# Patient Record
Sex: Female | Born: 1975 | Race: Black or African American | Hispanic: No | Marital: Married | Smoking: Former smoker
Health system: Southern US, Community
[De-identification: ages and names within clinical notes are randomized; demographics above are authoritative.]

## PROBLEM LIST (undated history)

## (undated) DIAGNOSIS — R718 Other abnormality of red blood cells: Secondary | ICD-10-CM

## (undated) DIAGNOSIS — I7300 Raynaud's syndrome without gangrene: Secondary | ICD-10-CM

## (undated) DIAGNOSIS — K589 Irritable bowel syndrome without diarrhea: Secondary | ICD-10-CM

## (undated) DIAGNOSIS — M357 Hypermobility syndrome: Secondary | ICD-10-CM

## (undated) DIAGNOSIS — M545 Low back pain, unspecified: Secondary | ICD-10-CM

## (undated) DIAGNOSIS — M4180 Other forms of scoliosis, site unspecified: Secondary | ICD-10-CM

## (undated) DIAGNOSIS — F3181 Bipolar II disorder: Secondary | ICD-10-CM

## (undated) DIAGNOSIS — Q6589 Other specified congenital deformities of hip: Secondary | ICD-10-CM

## (undated) DIAGNOSIS — E785 Hyperlipidemia, unspecified: Secondary | ICD-10-CM

## (undated) DIAGNOSIS — H539 Unspecified visual disturbance: Secondary | ICD-10-CM

## (undated) DIAGNOSIS — D763 Other histiocytosis syndromes: Secondary | ICD-10-CM

## (undated) DIAGNOSIS — H1010 Acute atopic conjunctivitis, unspecified eye: Secondary | ICD-10-CM

## (undated) DIAGNOSIS — M160 Bilateral primary osteoarthritis of hip: Secondary | ICD-10-CM

## (undated) DIAGNOSIS — M1990 Unspecified osteoarthritis, unspecified site: Secondary | ICD-10-CM

## (undated) DIAGNOSIS — K5790 Diverticulosis of intestine, part unspecified, without perforation or abscess without bleeding: Secondary | ICD-10-CM

## (undated) DIAGNOSIS — F4310 Post-traumatic stress disorder, unspecified: Secondary | ICD-10-CM

## (undated) DIAGNOSIS — M62838 Other muscle spasm: Secondary | ICD-10-CM

## (undated) DIAGNOSIS — H1045 Other chronic allergic conjunctivitis: Secondary | ICD-10-CM

## (undated) DIAGNOSIS — F909 Attention-deficit hyperactivity disorder, unspecified type: Secondary | ICD-10-CM

## (undated) DIAGNOSIS — K219 Gastro-esophageal reflux disease without esophagitis: Secondary | ICD-10-CM

## (undated) DIAGNOSIS — D509 Iron deficiency anemia, unspecified: Secondary | ICD-10-CM

## (undated) DIAGNOSIS — F419 Anxiety disorder, unspecified: Secondary | ICD-10-CM

## (undated) DIAGNOSIS — K2990 Gastroduodenitis, unspecified, without bleeding: Secondary | ICD-10-CM

## (undated) DIAGNOSIS — M797 Fibromyalgia: Secondary | ICD-10-CM

## (undated) DIAGNOSIS — I10 Essential (primary) hypertension: Secondary | ICD-10-CM

## (undated) DIAGNOSIS — J45909 Unspecified asthma, uncomplicated: Secondary | ICD-10-CM

## (undated) HISTORY — DX: Iron deficiency anemia, unspecified: D50.9

## (undated) HISTORY — DX: Post-traumatic stress disorder, unspecified: F43.10

## (undated) HISTORY — DX: Unspecified osteoarthritis, unspecified site: M19.90

## (undated) HISTORY — DX: Other chronic allergic conjunctivitis: H10.45

## (undated) HISTORY — DX: Acute atopic conjunctivitis, unspecified eye: H10.10

## (undated) HISTORY — DX: Low back pain, unspecified: M54.50

## (undated) HISTORY — DX: Attention-deficit hyperactivity disorder, unspecified type: F90.9

## (undated) HISTORY — DX: Bipolar II disorder: F31.81

## (undated) HISTORY — DX: Essential (primary) hypertension: I10

## (undated) HISTORY — DX: Irritable bowel syndrome, unspecified: K58.9

## (undated) HISTORY — DX: Gastroduodenitis, unspecified, without bleeding: K29.90

## (undated) HISTORY — PX: WISDOM TOOTH EXTRACTION: SHX21

## (undated) HISTORY — DX: Hypermobility syndrome: M35.7

## (undated) HISTORY — DX: Other muscle spasm: M62.838

## (undated) HISTORY — DX: Bilateral primary osteoarthritis of hip: M16.0

## (undated) HISTORY — DX: Unspecified visual disturbance: H53.9

## (undated) HISTORY — PX: ABDOMINAL HYSTERECTOMY: SHX81

## (undated) HISTORY — DX: Other specified congenital deformities of hip: Q65.89

## (undated) HISTORY — DX: Other abnormality of red blood cells: R71.8

## (undated) HISTORY — DX: Hyperlipidemia, unspecified: E78.5

## (undated) HISTORY — PX: TONSILLECTOMY: SUR1361

## (undated) HISTORY — DX: Other histiocytosis syndromes: D76.3

## (undated) HISTORY — DX: Other forms of scoliosis, site unspecified: M41.80

## (undated) HISTORY — PX: OTHER SURGICAL HISTORY: SHX169

## (undated) HISTORY — DX: Diverticulosis of intestine, part unspecified, without perforation or abscess without bleeding: K57.90

---

## 1997-11-03 ENCOUNTER — Encounter: Admission: RE | Admit: 1997-11-03 | Discharge: 1997-11-03 | Payer: Self-pay | Admitting: Internal Medicine

## 2009-12-13 ENCOUNTER — Encounter: Admission: RE | Admit: 2009-12-13 | Discharge: 2009-12-13 | Payer: Self-pay | Admitting: Gastroenterology

## 2009-12-28 HISTORY — PX: COLONOSCOPY: SHX174

## 2009-12-28 HISTORY — PX: ESOPHAGOGASTRODUODENOSCOPY: SHX1529

## 2014-02-05 DIAGNOSIS — K219 Gastro-esophageal reflux disease without esophagitis: Secondary | ICD-10-CM

## 2014-02-05 HISTORY — DX: Gastro-esophageal reflux disease without esophagitis: K21.9

## 2014-02-16 DIAGNOSIS — G894 Chronic pain syndrome: Secondary | ICD-10-CM

## 2014-02-16 HISTORY — DX: Chronic pain syndrome: G89.4

## 2014-04-11 DIAGNOSIS — R202 Paresthesia of skin: Secondary | ICD-10-CM | POA: Insufficient documentation

## 2014-04-11 DIAGNOSIS — R200 Anesthesia of skin: Secondary | ICD-10-CM

## 2014-04-11 HISTORY — DX: Paresthesia of skin: R20.2

## 2014-04-11 HISTORY — DX: Anesthesia of skin: R20.0

## 2014-04-25 ENCOUNTER — Ambulatory Visit: Payer: Self-pay | Admitting: Neurology

## 2014-04-26 ENCOUNTER — Telehealth: Payer: Self-pay | Admitting: Neurology

## 2014-04-26 ENCOUNTER — Encounter: Payer: Self-pay | Admitting: Neurology

## 2014-04-26 NOTE — Telephone Encounter (Signed)
Pt no showed NP appt sch w/ Dr. Everlena Cooper 04/25/14. No show letter with no show policy mailed to pt. I will need to pull the referral packet to get referring provider contact info and will notify the referring provider / Sherri S.

## 2014-05-02 ENCOUNTER — Telehealth: Payer: Self-pay | Admitting: Neurology

## 2014-05-02 NOTE — Telephone Encounter (Signed)
Faxed no show notification to referring provider's office, Dr. Wyona Almas # [phone] / Oneita Kras.

## 2014-05-08 DIAGNOSIS — M797 Fibromyalgia: Secondary | ICD-10-CM

## 2014-05-08 HISTORY — DX: Fibromyalgia: M79.7

## 2014-06-05 DIAGNOSIS — I7300 Raynaud's syndrome without gangrene: Secondary | ICD-10-CM

## 2014-06-05 HISTORY — DX: Raynaud's syndrome without gangrene: I73.00

## 2014-06-26 DIAGNOSIS — R7689 Other specified abnormal immunological findings in serum: Secondary | ICD-10-CM

## 2014-06-26 DIAGNOSIS — R768 Other specified abnormal immunological findings in serum: Secondary | ICD-10-CM

## 2014-06-26 HISTORY — DX: Other specified abnormal immunological findings in serum: R76.89

## 2014-06-26 HISTORY — DX: Other specified abnormal immunological findings in serum: R76.8

## 2014-07-13 DIAGNOSIS — M7061 Trochanteric bursitis, right hip: Secondary | ICD-10-CM

## 2014-07-13 HISTORY — DX: Trochanteric bursitis, right hip: M70.61

## 2014-07-24 DIAGNOSIS — M7051 Other bursitis of knee, right knee: Secondary | ICD-10-CM

## 2014-07-24 HISTORY — DX: Other bursitis of knee, right knee: M70.51

## 2014-07-28 DIAGNOSIS — E559 Vitamin D deficiency, unspecified: Secondary | ICD-10-CM

## 2014-07-28 HISTORY — DX: Vitamin D deficiency, unspecified: E55.9

## 2014-08-27 DIAGNOSIS — M25572 Pain in left ankle and joints of left foot: Secondary | ICD-10-CM

## 2014-08-27 HISTORY — DX: Pain in left ankle and joints of left foot: M25.572

## 2014-09-18 DIAGNOSIS — M25562 Pain in left knee: Secondary | ICD-10-CM

## 2014-09-18 DIAGNOSIS — G8929 Other chronic pain: Secondary | ICD-10-CM

## 2014-09-18 HISTORY — DX: Pain in left knee: M25.562

## 2014-09-18 HISTORY — DX: Other chronic pain: G89.29

## 2014-11-29 ENCOUNTER — Emergency Department (HOSPITAL_BASED_OUTPATIENT_CLINIC_OR_DEPARTMENT_OTHER): Payer: Self-pay

## 2014-11-29 ENCOUNTER — Emergency Department (HOSPITAL_BASED_OUTPATIENT_CLINIC_OR_DEPARTMENT_OTHER)
Admission: EM | Admit: 2014-11-29 | Discharge: 2014-11-29 | Disposition: A | Discharge status: Home / Self Care | Payer: Self-pay | Attending: Emergency Medicine | Admitting: Emergency Medicine

## 2014-11-29 ENCOUNTER — Encounter (HOSPITAL_BASED_OUTPATIENT_CLINIC_OR_DEPARTMENT_OTHER): Payer: Self-pay | Admitting: *Deleted

## 2014-11-29 DIAGNOSIS — Y998 Other external cause status: Secondary | ICD-10-CM | POA: Insufficient documentation

## 2014-11-29 DIAGNOSIS — Y9389 Activity, other specified: Secondary | ICD-10-CM | POA: Insufficient documentation

## 2014-11-29 DIAGNOSIS — S199XXA Unspecified injury of neck, initial encounter: Secondary | ICD-10-CM | POA: Insufficient documentation

## 2014-11-29 DIAGNOSIS — Z7951 Long term (current) use of inhaled steroids: Secondary | ICD-10-CM | POA: Insufficient documentation

## 2014-11-29 DIAGNOSIS — Y92410 Unspecified street and highway as the place of occurrence of the external cause: Secondary | ICD-10-CM | POA: Insufficient documentation

## 2014-11-29 DIAGNOSIS — F419 Anxiety disorder, unspecified: Secondary | ICD-10-CM | POA: Insufficient documentation

## 2014-11-29 DIAGNOSIS — Z8679 Personal history of other diseases of the circulatory system: Secondary | ICD-10-CM | POA: Insufficient documentation

## 2014-11-29 DIAGNOSIS — Z880 Allergy status to penicillin: Secondary | ICD-10-CM | POA: Insufficient documentation

## 2014-11-29 DIAGNOSIS — S6991XA Unspecified injury of right wrist, hand and finger(s), initial encounter: Secondary | ICD-10-CM | POA: Insufficient documentation

## 2014-11-29 DIAGNOSIS — K219 Gastro-esophageal reflux disease without esophagitis: Secondary | ICD-10-CM | POA: Insufficient documentation

## 2014-11-29 DIAGNOSIS — Z8739 Personal history of other diseases of the musculoskeletal system and connective tissue: Secondary | ICD-10-CM | POA: Insufficient documentation

## 2014-11-29 DIAGNOSIS — Z79899 Other long term (current) drug therapy: Secondary | ICD-10-CM | POA: Insufficient documentation

## 2014-11-29 DIAGNOSIS — Z87891 Personal history of nicotine dependence: Secondary | ICD-10-CM | POA: Insufficient documentation

## 2014-11-29 DIAGNOSIS — J45909 Unspecified asthma, uncomplicated: Secondary | ICD-10-CM | POA: Insufficient documentation

## 2014-11-29 HISTORY — DX: Unspecified asthma, uncomplicated: J45.909

## 2014-11-29 HISTORY — DX: Gastro-esophageal reflux disease without esophagitis: K21.9

## 2014-11-29 HISTORY — DX: Fibromyalgia: M79.7

## 2014-11-29 HISTORY — DX: Raynaud's syndrome without gangrene: I73.00

## 2014-11-29 HISTORY — DX: Anxiety disorder, unspecified: F41.9
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
## 2014-11-29 NOTE — Discharge Instructions (Signed)

## 2014-11-29 NOTE — ED Notes (Signed)
Patient transported to X-ray via stretcher, sr x 2 up 
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
## 2014-11-29 NOTE — ED Notes (Signed)
Per EDP orders, C-collar removed. Splint to rt wrist applied by EMT

## 2014-11-29 NOTE — ED Notes (Signed)
MD at bedside. 

## 2014-12-22 DIAGNOSIS — G35 Multiple sclerosis: Secondary | ICD-10-CM | POA: Insufficient documentation

## 2014-12-22 HISTORY — DX: Multiple sclerosis: G35

## 2015-04-03 DIAGNOSIS — F419 Anxiety disorder, unspecified: Secondary | ICD-10-CM

## 2015-04-03 DIAGNOSIS — F32A Depression, unspecified: Secondary | ICD-10-CM

## 2015-04-03 DIAGNOSIS — F329 Major depressive disorder, single episode, unspecified: Secondary | ICD-10-CM | POA: Insufficient documentation

## 2015-04-03 HISTORY — DX: Depression, unspecified: F32.A

## 2015-04-03 HISTORY — DX: Anxiety disorder, unspecified: F41.9

## 2015-04-24 DIAGNOSIS — F4001 Agoraphobia with panic disorder: Secondary | ICD-10-CM

## 2015-04-24 DIAGNOSIS — T7840XA Allergy, unspecified, initial encounter: Secondary | ICD-10-CM

## 2015-04-24 HISTORY — DX: Allergy, unspecified, initial encounter: T78.40XA

## 2015-04-24 HISTORY — DX: Agoraphobia with panic disorder: F40.01
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
## 2016-10-03 HISTORY — PX: OTHER SURGICAL HISTORY: SHX169

## 2016-10-23 DIAGNOSIS — M5020 Other cervical disc displacement, unspecified cervical region: Secondary | ICD-10-CM

## 2016-10-23 DIAGNOSIS — M4802 Spinal stenosis, cervical region: Secondary | ICD-10-CM | POA: Insufficient documentation

## 2016-10-23 DIAGNOSIS — M5412 Radiculopathy, cervical region: Secondary | ICD-10-CM

## 2016-10-23 HISTORY — DX: Other cervical disc displacement, unspecified cervical region: M50.20

## 2016-10-23 HISTORY — DX: Spinal stenosis, cervical region: M48.02

## 2016-10-23 HISTORY — DX: Radiculopathy, cervical region: M54.12

## 2016-10-28 ENCOUNTER — Other Ambulatory Visit: Payer: Self-pay | Admitting: Neurological Surgery

## 2016-10-28 DIAGNOSIS — M542 Cervicalgia: Secondary | ICD-10-CM

## 2016-11-02 HISTORY — PX: OTHER SURGICAL HISTORY: SHX169

## 2018-01-07 ENCOUNTER — Other Ambulatory Visit: Payer: Self-pay | Admitting: Family Medicine

## 2018-01-07 DIAGNOSIS — H538 Other visual disturbances: Secondary | ICD-10-CM

## 2018-01-19 ENCOUNTER — Ambulatory Visit
Admission: RE | Admit: 2018-01-19 | Discharge: 2018-01-19 | Disposition: A | Discharge status: Home / Self Care | Payer: 59 | Source: Ambulatory Visit | Attending: Family Medicine | Admitting: Family Medicine

## 2018-01-19 DIAGNOSIS — H538 Other visual disturbances: Secondary | ICD-10-CM

## 2018-01-19 MED ORDER — GADOBENATE DIMEGLUMINE 529 MG/ML IV SOLN
20.0000 mL | Freq: Once | INTRAVENOUS | Status: AC | PRN
  Administered 2018-01-19: 20 mL via INTRAVENOUS

## 2018-01-29 ENCOUNTER — Ambulatory Visit: Payer: 59 | Admitting: Neurology

## 2018-01-29 ENCOUNTER — Encounter: Payer: Self-pay | Admitting: Neurology

## 2018-01-29 ENCOUNTER — Other Ambulatory Visit: Payer: Self-pay

## 2018-01-29 VITALS — BP 136/95 | HR 88 | Resp 18 | Ht 65.0 in | Wt 234.0 lb

## 2018-01-29 DIAGNOSIS — R202 Paresthesia of skin: Secondary | ICD-10-CM

## 2018-01-29 DIAGNOSIS — F419 Anxiety disorder, unspecified: Secondary | ICD-10-CM

## 2018-01-29 DIAGNOSIS — R200 Anesthesia of skin: Secondary | ICD-10-CM

## 2018-01-29 DIAGNOSIS — G35 Multiple sclerosis: Secondary | ICD-10-CM

## 2018-01-29 DIAGNOSIS — G4489 Other headache syndrome: Secondary | ICD-10-CM | POA: Insufficient documentation

## 2018-01-29 DIAGNOSIS — Z79899 Other long term (current) drug therapy: Secondary | ICD-10-CM | POA: Insufficient documentation

## 2018-01-29 DIAGNOSIS — R3915 Urgency of urination: Secondary | ICD-10-CM | POA: Insufficient documentation

## 2018-01-29 DIAGNOSIS — F329 Major depressive disorder, single episode, unspecified: Secondary | ICD-10-CM

## 2018-01-29 HISTORY — DX: Other headache syndrome: G44.89

## 2018-01-29 HISTORY — DX: Urgency of urination: R39.15

## 2018-01-29 HISTORY — DX: Other long term (current) drug therapy: Z79.899

## 2018-01-29 MED ORDER — ONDANSETRON HCL 4 MG PO TABS: 4.0000 mg | ORAL_TABLET | Freq: Every day | ORAL | 2 refills | Status: DC | PRN

## 2018-01-29 MED ORDER — OXCARBAZEPINE 150 MG PO TABS: 150.0000 mg | ORAL_TABLET | Freq: Two times a day (BID) | ORAL | 5 refills | Status: DC
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
## 2018-02-02 LAB — QUANTIFERON-TB GOLD PLUS
QUANTIFERON TB2 AG VALUE: 0.53 [IU]/mL
QuantiFERON Mitogen Value: >10 IU/mL
QuantiFERON Nil Value: 0.09 IU/mL
QuantiFERON TB1 Ag Value: 0.5 IU/mL
QuantiFERON-TB Gold Plus: POSITIVE — AB

## 2018-02-02 LAB — CBC WITH DIFFERENTIAL/PLATELET
BASOS: 1 %
Basophils Absolute: 0.1 10*3/uL (ref 0.0–0.2)
EOS (ABSOLUTE): 0.1 10*3/uL (ref 0.0–0.4)
Eos: 1 %
HEMOGLOBIN: 13 g/dL (ref 11.1–15.9)
Hematocrit: 39.6 % (ref 34.0–46.6)
IMMATURE GRANS (ABS): 0 10*3/uL (ref 0.0–0.1)
Immature Granulocytes: 0 %
LYMPHS: 33 %
Lymphocytes Absolute: 3.2 10*3/uL — ABNORMAL HIGH (ref 0.7–3.1)
MCH: 25.1 pg — AB (ref 26.6–33.0)
MCHC: 32.8 g/dL (ref 31.5–35.7)
MCV: 76 fL — AB (ref 79–97)
MONOCYTES: 6 %
Monocytes Absolute: 0.6 10*3/uL (ref 0.1–0.9)
NEUTROS ABS: 5.9 10*3/uL (ref 1.4–7.0)
Neutrophils: 59 %
Platelets: 338 10*3/uL (ref 150–450)
RBC: 5.18 x10E6/uL (ref 3.77–5.28)
RDW: 14.4 % (ref 12.3–15.4)
WBC: 9.8 10*3/uL (ref 3.4–10.8)

## 2018-02-02 LAB — COMPREHENSIVE METABOLIC PANEL
A/G RATIO: 1.8 (ref 1.2–2.2)
ALBUMIN: 4.3 g/dL (ref 3.5–5.5)
ALT: 12 IU/L (ref 0–32)
AST: 14 IU/L (ref 0–40)
Alkaline Phosphatase: 98 IU/L (ref 39–117)
BILIRUBIN TOTAL: 0.4 mg/dL (ref 0.0–1.2)
BUN / CREAT RATIO: 12 (ref 9–23)
BUN: 11 mg/dL (ref 6–24)
CO2: 21 mmol/L (ref 20–29)
Calcium: 9.5 mg/dL (ref 8.7–10.2)
Chloride: 99 mmol/L (ref 96–106)
Creatinine, Ser: 0.94 mg/dL (ref 0.57–1.00)
GFR calc non Af Amer: 75 mL/min/{1.73_m2} (ref >59)
GFR, EST AFRICAN AMERICAN: 87 mL/min/{1.73_m2} (ref >59)
GLOBULIN, TOTAL: 2.4 g/dL (ref 1.5–4.5)
Glucose: 107 mg/dL — ABNORMAL HIGH (ref 65–99)
Potassium: 4.3 mmol/L (ref 3.5–5.2)
Sodium: 137 mmol/L (ref 134–144)
TOTAL PROTEIN: 6.7 g/dL (ref 6.0–8.5)

## 2018-02-08 ENCOUNTER — Telehealth: Payer: Self-pay | Admitting: *Deleted

## 2018-02-08 DIAGNOSIS — G35 Multiple sclerosis: Secondary | ICD-10-CM

## 2018-02-08 DIAGNOSIS — R7611 Nonspecific reaction to tuberculin skin test without active tuberculosis: Secondary | ICD-10-CM

## 2018-02-08 NOTE — Telephone Encounter (Signed)
Called, LVM for pt to call about results.  

## 2018-02-08 NOTE — Telephone Encounter (Signed)
Called pt and relayed results per Dr. Epimenio Foot note. She verbalized understanding and would like [place] to place referral to Dr. Meredeth Ide. Advised she should be called within the next week or so to get scheduled. If not, she should contact their office to schedule.

## 2018-02-08 NOTE — Telephone Encounter (Signed)
Pt returning RN's call.

## 2018-02-08 NOTE — Telephone Encounter (Signed)
Called, LVM returning pt call 

## 2018-02-08 NOTE — Telephone Encounter (Signed)
I called pt and advised no skin TB test needed. She should f/u with Dr. Meredeth Ide. Pt wants TB skin test. She is going to request this from PCP. Nothing further needed.

## 2018-02-08 NOTE — Telephone Encounter (Signed)
Pt requesting a call stating she would like to know if a skin test can be done to determine if she has TB

## 2018-02-08 NOTE — Telephone Encounter (Signed)
-----   Message from Asa Lente, MD sent at 02/05/2018 12:08 PM EDT ----- We were thinking about starting Aubagio for her MS.  However, the Quantiferon TB test was positive and it is possible that she has tuberculosis.  She has seen Dr. Meredeth Ide [facility] pulmonology) just a couple months ago and she should follow-up with him about this.  (If we need to we can re--refer her to him).   She can not go on Aubagio unless we know that she does not have TB

## 2018-02-10 NOTE — Telephone Encounter (Addendum)
I called patient back. She picked up the call. I explained to her that she would still need to see pulmonologist to verify she does not have TB. Pt did not respond and she ended that call.  Spoke with Dr. Epimenio Foot who is not comfortable starting medication for MS until patient is cleared by pulmonologist.

## 2018-02-10 NOTE — Telephone Encounter (Signed)
Patient said she went to her PCP and they did a TB skin test and she does not have Tuberculosis. 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

## 2018-05-17 ENCOUNTER — Encounter: Payer: Self-pay | Admitting: Neurology

## 2018-06-11 ENCOUNTER — Ambulatory Visit: Payer: 59 | Admitting: Neurology

## 2018-07-09 ENCOUNTER — Ambulatory Visit: Payer: 59 | Admitting: Neurology

## 2018-07-16 ENCOUNTER — Ambulatory Visit: Payer: 59 | Admitting: Neurology

## 2018-08-20 ENCOUNTER — Ambulatory Visit: Payer: 59 | Admitting: Neurology

## 2018-08-23 ENCOUNTER — Ambulatory Visit (INDEPENDENT_AMBULATORY_CARE_PROVIDER_SITE_OTHER): Payer: Medicare Other | Admitting: Neurology

## 2018-08-23 ENCOUNTER — Encounter: Payer: Self-pay | Admitting: Neurology

## 2018-08-23 ENCOUNTER — Other Ambulatory Visit: Payer: Self-pay

## 2018-08-23 VITALS — BP 161/100 | HR 93 | Temp 96.8°F | Ht 65.0 in | Wt 237.5 lb

## 2018-08-23 DIAGNOSIS — R200 Anesthesia of skin: Secondary | ICD-10-CM | POA: Diagnosis not present

## 2018-08-23 DIAGNOSIS — F419 Anxiety disorder, unspecified: Secondary | ICD-10-CM

## 2018-08-23 DIAGNOSIS — R3915 Urgency of urination: Secondary | ICD-10-CM

## 2018-08-23 DIAGNOSIS — G35 Multiple sclerosis: Secondary | ICD-10-CM

## 2018-08-23 DIAGNOSIS — G4489 Other headache syndrome: Secondary | ICD-10-CM

## 2018-08-23 DIAGNOSIS — F329 Major depressive disorder, single episode, unspecified: Secondary | ICD-10-CM

## 2018-08-23 DIAGNOSIS — R110 Nausea: Secondary | ICD-10-CM

## 2018-08-23 DIAGNOSIS — R202 Paresthesia of skin: Secondary | ICD-10-CM

## 2018-08-23 HISTORY — DX: Nausea: R11.0

## 2018-08-23 MED ORDER — ONDANSETRON HCL 4 MG PO TABS: 4.0000 mg | ORAL_TABLET | Freq: Every day | ORAL | 2 refills | Status: DC | PRN

## 2018-08-23 MED ORDER — SOLIFENACIN SUCCINATE 5 MG PO TABS: 5.0000 mg | ORAL_TABLET | Freq: Every day | ORAL | 11 refills | Status: DC
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
## 2018-12-24 ENCOUNTER — Ambulatory Visit: Payer: 59 | Admitting: Neurology

## 2019-01-26 ENCOUNTER — Other Ambulatory Visit: Payer: Self-pay

## 2019-01-26 ENCOUNTER — Encounter: Payer: Self-pay | Admitting: Neurology

## 2019-01-26 ENCOUNTER — Ambulatory Visit (INDEPENDENT_AMBULATORY_CARE_PROVIDER_SITE_OTHER): Payer: Medicare Other | Admitting: Neurology

## 2019-01-26 VITALS — BP 138/90 | HR 101 | Temp 98.2°F | Ht 65.0 in | Wt 238.5 lb

## 2019-01-26 DIAGNOSIS — G35 Multiple sclerosis: Secondary | ICD-10-CM | POA: Diagnosis not present

## 2019-01-26 DIAGNOSIS — R202 Paresthesia of skin: Secondary | ICD-10-CM

## 2019-01-26 DIAGNOSIS — R208 Other disturbances of skin sensation: Secondary | ICD-10-CM | POA: Insufficient documentation

## 2019-01-26 DIAGNOSIS — R200 Anesthesia of skin: Secondary | ICD-10-CM | POA: Diagnosis not present

## 2019-01-26 DIAGNOSIS — M5416 Radiculopathy, lumbar region: Secondary | ICD-10-CM

## 2019-01-26 HISTORY — DX: Radiculopathy, lumbar region: M54.16

## 2019-01-26 HISTORY — DX: Other disturbances of skin sensation: R20.8

## 2019-01-26 MED ORDER — OXCARBAZEPINE 150 MG PO TABS: 150.0000 mg | ORAL_TABLET | Freq: Two times a day (BID) | ORAL | 5 refills | Status: DC
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
## 2019-02-01 ENCOUNTER — Telehealth: Payer: Self-pay | Admitting: Neurology

## 2019-02-01 ENCOUNTER — Other Ambulatory Visit: Payer: Self-pay | Admitting: *Deleted

## 2019-02-01 DIAGNOSIS — M5416 Radiculopathy, lumbar region: Secondary | ICD-10-CM

## 2019-02-01 NOTE — Telephone Encounter (Signed)
Referral Form to Dr. Daisey Must Neuro surg.

## 2019-02-02 NOTE — Telephone Encounter (Signed)
Faxed referral to [facility] for Dr. Maryjean Ka.

## 2019-02-09 ENCOUNTER — Ambulatory Visit: Payer: Self-pay | Admitting: Neurology

## 2019-02-10 ENCOUNTER — Telehealth: Payer: Self-pay | Admitting: *Deleted

## 2019-02-10 NOTE — Telephone Encounter (Signed)
Spoke with the patient and she has agreed to come in and see Dr. Felecia Shelling on 02-14-2019 at 2:30 pm

## 2019-02-10 NOTE — Telephone Encounter (Signed)
Called, LVM for pt to call office back.  I sent mychart message offering work in appt on 02/14/2019 at 2:30pm with Dr. Felecia Shelling but she has not seen in yet. Wanting to know if she would like this appt.

## 2019-02-10 NOTE — Telephone Encounter (Signed)
Noted, thank you

## 2019-02-14 ENCOUNTER — Ambulatory Visit: Payer: Self-pay | Admitting: Neurology

## 2019-02-14 ENCOUNTER — Encounter: Payer: Self-pay | Admitting: Neurology

## 2019-02-21 ENCOUNTER — Ambulatory Visit: Payer: Self-pay | Admitting: Neurology

## 2019-04-28 ENCOUNTER — Ambulatory Visit: Payer: Medicare Other | Admitting: Neurology

## 2019-06-15 ENCOUNTER — Ambulatory Visit (INDEPENDENT_AMBULATORY_CARE_PROVIDER_SITE_OTHER): Payer: Medicare Other | Admitting: Neurology

## 2019-06-15 ENCOUNTER — Encounter: Payer: Self-pay | Admitting: Neurology

## 2019-06-15 ENCOUNTER — Other Ambulatory Visit: Payer: Self-pay

## 2019-06-15 VITALS — BP 120/80 | HR 98 | Temp 97.3°F | Ht 65.0 in | Wt 236.0 lb

## 2019-06-15 DIAGNOSIS — G96198 Other disorders of meninges, not elsewhere classified: Secondary | ICD-10-CM

## 2019-06-15 DIAGNOSIS — Z79899 Other long term (current) drug therapy: Secondary | ICD-10-CM | POA: Diagnosis not present

## 2019-06-15 DIAGNOSIS — M5416 Radiculopathy, lumbar region: Secondary | ICD-10-CM | POA: Diagnosis not present

## 2019-06-15 DIAGNOSIS — G35 Multiple sclerosis: Secondary | ICD-10-CM | POA: Diagnosis not present

## 2019-06-15 DIAGNOSIS — R3915 Urgency of urination: Secondary | ICD-10-CM

## 2019-06-15 DIAGNOSIS — R208 Other disturbances of skin sensation: Secondary | ICD-10-CM

## 2019-06-15 HISTORY — DX: Other disorders of meninges, not elsewhere classified: G96.198

## 2019-06-15 MED ORDER — OXCARBAZEPINE 150 MG PO TABS: ORAL_TABLET | ORAL | 4 refills | Status: DC

## 2019-06-15 MED ORDER — METHYLPREDNISOLONE 4 MG PO TABS: ORAL_TABLET | ORAL | 0 refills | Status: DC

## 2019-06-15 MED ORDER — AMITRIPTYLINE HCL 50 MG PO TABS: 50.0000 mg | ORAL_TABLET | Freq: Every day | ORAL | 4 refills | Status: DC
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
## 2019-06-28 ENCOUNTER — Other Ambulatory Visit: Payer: Self-pay | Admitting: Neurology

## 2019-06-28 DIAGNOSIS — Z79899 Other long term (current) drug therapy: Secondary | ICD-10-CM

## 2019-06-28 DIAGNOSIS — G35 Multiple sclerosis: Secondary | ICD-10-CM

## 2019-06-28 DIAGNOSIS — Z114 Encounter for screening for human immunodeficiency virus [HIV]: Secondary | ICD-10-CM

## 2019-07-01 ENCOUNTER — Ambulatory Visit: Payer: 59

## 2019-07-11 ENCOUNTER — Other Ambulatory Visit: Payer: Self-pay | Admitting: Neurology

## 2019-07-11 MED ORDER — HYDROXYZINE HCL 25 MG PO TABS: 25.0000 mg | ORAL_TABLET | Freq: Three times a day (TID) | ORAL | 2 refills | Status: DC | PRN

## 2019-07-13 ENCOUNTER — Other Ambulatory Visit (INDEPENDENT_AMBULATORY_CARE_PROVIDER_SITE_OTHER): Payer: Self-pay

## 2019-07-13 DIAGNOSIS — Z0289 Encounter for other administrative examinations: Secondary | ICD-10-CM

## 2019-07-26 NOTE — Telephone Encounter (Signed)
Pt advised she no longer wishes to take the kesimpta due to concerns for side effects states she had a bad reaction to moderna for about a week. And would like to know what the next step would be and for any upcoming apts with the change

## 2019-07-26 NOTE — Telephone Encounter (Addendum)
Called and LVM for pt to call office because she has not received our mychart messages yet. Wanted to get update to see when she was planning on coming to get lab work that Dr. Epimenio Foot ordered.

## 2019-07-27 ENCOUNTER — Ambulatory Visit: Payer: Medicare Other | Admitting: Neurology

## 2019-07-27 NOTE — Telephone Encounter (Signed)
Called pt. Offered appt 08/01/19 at 8:30am with Dr. Epimenio Foot. She declined. States she is with her grandmother on [address]. Would like to wait for any cx on a different day in the next month. Advised I will call her if anything becomes available. She verbalized understanding.

## 2019-08-02 NOTE — Telephone Encounter (Signed)
Called pt at [phone] and LVM for her to call office back. Advised I was going to offer an appt either tomorrow or Thursday.

## 2019-08-03 ENCOUNTER — Other Ambulatory Visit: Payer: Self-pay | Admitting: Neurology

## 2019-08-30 NOTE — Telephone Encounter (Signed)
Called pt and offered an appt today at 1130am with Dr. Epimenio Foot. He had a cancellation. Pt declined, states she is taking methylprednisolone right now because she hurt her back. She is walking with a walker. She will call once feeling better to see if there are any cx. Advised I will also call if any cx come up in the future.

## 2019-09-17 ENCOUNTER — Other Ambulatory Visit: Payer: Self-pay | Admitting: Neurology

## 2019-11-14 ENCOUNTER — Ambulatory Visit (INDEPENDENT_AMBULATORY_CARE_PROVIDER_SITE_OTHER): Payer: Medicare Other | Admitting: Neurology

## 2019-11-14 ENCOUNTER — Encounter: Payer: Self-pay | Admitting: Neurology

## 2019-11-14 VITALS — BP 126/82 | HR 106 | Ht 65.0 in | Wt 228.0 lb

## 2019-11-14 DIAGNOSIS — F319 Bipolar disorder, unspecified: Secondary | ICD-10-CM | POA: Diagnosis not present

## 2019-11-14 DIAGNOSIS — G96198 Other disorders of meninges, not elsewhere classified: Secondary | ICD-10-CM

## 2019-11-14 DIAGNOSIS — G35 Multiple sclerosis: Secondary | ICD-10-CM | POA: Diagnosis not present

## 2019-11-14 DIAGNOSIS — R208 Other disturbances of skin sensation: Secondary | ICD-10-CM

## 2019-11-14 DIAGNOSIS — Z79899 Other long term (current) drug therapy: Secondary | ICD-10-CM

## 2019-11-14 MED ORDER — METHYLPREDNISOLONE 4 MG PO TABS: ORAL_TABLET | ORAL | 0 refills | Status: DC

## 2019-11-14 MED ORDER — METHOCARBAMOL 500 MG PO TABS: 500.0000 mg | ORAL_TABLET | Freq: Three times a day (TID) | ORAL | 1 refills | Status: DC

## 2019-11-14 MED ORDER — LAMOTRIGINE 100 MG PO TABS
ORAL_TABLET | ORAL | 5 refills | Status: DC
Start: 2019-11-14 — End: 2020-05-15
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
## 2019-11-14 NOTE — Patient Instructions (Signed)
The pharmacy has the prescription for lamotrigine 100 mg tablets. For 5 days, just take one half pill a day. For the next 5 days, take one half pill twice a day. For the next 5 days, take one half pill 3 times a day Then start taking one pill twice a day from this point on.      If you get a rash, need to stop the medication and not take it again. 

## 2019-11-23 ENCOUNTER — Telehealth: Payer: Self-pay | Admitting: *Deleted

## 2019-11-23 LAB — CBC WITH DIFFERENTIAL/PLATELET
Basophils Absolute: 0.1 10*3/uL (ref 0.0–0.2)
Basos: 1 %
EOS (ABSOLUTE): 0.2 10*3/uL (ref 0.0–0.4)
Eos: 2 %
Hematocrit: 41.5 % (ref 34.0–46.6)
Hemoglobin: 13.1 g/dL (ref 11.1–15.9)
Immature Grans (Abs): 0.1 10*3/uL (ref 0.0–0.1)
Immature Granulocytes: 1 %
Lymphocytes Absolute: 4 10*3/uL — ABNORMAL HIGH (ref 0.7–3.1)
Lymphs: 38 %
MCH: 23.9 pg — ABNORMAL LOW (ref 26.6–33.0)
MCHC: 31.6 g/dL (ref 31.5–35.7)
MCV: 76 fL — ABNORMAL LOW (ref 79–97)
Monocytes Absolute: 0.6 10*3/uL (ref 0.1–0.9)
Monocytes: 6 %
Neutrophils Absolute: 5.6 10*3/uL (ref 1.4–7.0)
Neutrophils: 52 %
Platelets: 354 10*3/uL (ref 150–450)
RBC: 5.49 x10E6/uL — ABNORMAL HIGH (ref 3.77–5.28)
RDW: 14.5 % (ref 11.7–15.4)
WBC: 10.5 10*3/uL (ref 3.4–10.8)

## 2019-11-23 LAB — VARICELLA ZOSTER ANTIBODY, IGG: Varicella zoster IgG: 2562 index (ref >165)

## 2019-11-23 LAB — QUANTIFERON-TB GOLD PLUS
QuantiFERON Mitogen Value: >10 IU/mL
QuantiFERON Nil Value: 0.02 IU/mL
QuantiFERON TB1 Ag Value: 0.24 IU/mL
QuantiFERON TB2 Ag Value: 0.3 IU/mL
QuantiFERON-TB Gold Plus: NEGATIVE

## 2019-11-23 LAB — CYP2C9 GENOTYPING SIPONIMOD

## 2019-11-23 NOTE — Telephone Encounter (Signed)
Faxed completed/signed [employer] start form to [phone]. Received fax confirmation. Sent copy of start form to be scanned to epic.  Pt cleared to start therapy per Dr. Epimenio Foot. He dose will be as follows:  Starter pack: Day 1-2: 0.25mg  po qd, day 3: 2x0.25mg , day 4: 3x0.25mg , Day 5: 5x0.25mg  Maintenance dose: 2mg  po qd

## 2020-02-10 ENCOUNTER — Other Ambulatory Visit: Payer: Self-pay | Admitting: Neurology

## 2020-03-16 ENCOUNTER — Ambulatory Visit: Payer: 59

## 2020-03-22 ENCOUNTER — Ambulatory Visit: Payer: Medicare Other | Admitting: Neurology

## 2020-05-06 ENCOUNTER — Other Ambulatory Visit: Payer: Self-pay

## 2020-05-15 ENCOUNTER — Encounter: Payer: Self-pay | Admitting: Cardiology

## 2020-05-15 ENCOUNTER — Encounter: Payer: Self-pay | Admitting: *Deleted

## 2020-05-15 DIAGNOSIS — I10 Essential (primary) hypertension: Secondary | ICD-10-CM | POA: Insufficient documentation

## 2020-05-15 DIAGNOSIS — E785 Hyperlipidemia, unspecified: Secondary | ICD-10-CM | POA: Insufficient documentation

## 2020-05-15 DIAGNOSIS — K219 Gastro-esophageal reflux disease without esophagitis: Secondary | ICD-10-CM | POA: Insufficient documentation

## 2020-05-15 DIAGNOSIS — J45909 Unspecified asthma, uncomplicated: Secondary | ICD-10-CM | POA: Insufficient documentation

## 2020-05-15 DIAGNOSIS — D509 Iron deficiency anemia, unspecified: Secondary | ICD-10-CM | POA: Insufficient documentation

## 2020-05-15 DIAGNOSIS — I7300 Raynaud's syndrome without gangrene: Secondary | ICD-10-CM | POA: Insufficient documentation

## 2020-05-21 DIAGNOSIS — H539 Unspecified visual disturbance: Secondary | ICD-10-CM | POA: Insufficient documentation

## 2020-05-21 DIAGNOSIS — I10 Essential (primary) hypertension: Secondary | ICD-10-CM | POA: Insufficient documentation

## 2020-05-21 DIAGNOSIS — M797 Fibromyalgia: Secondary | ICD-10-CM | POA: Insufficient documentation

## 2020-05-25 ENCOUNTER — Ambulatory Visit (INDEPENDENT_AMBULATORY_CARE_PROVIDER_SITE_OTHER): Payer: Medicare Other

## 2020-05-25 ENCOUNTER — Other Ambulatory Visit: Payer: Self-pay

## 2020-05-25 ENCOUNTER — Encounter: Payer: Self-pay | Admitting: Cardiology

## 2020-05-25 ENCOUNTER — Ambulatory Visit (INDEPENDENT_AMBULATORY_CARE_PROVIDER_SITE_OTHER): Payer: Medicare Other | Admitting: Cardiology

## 2020-05-25 VITALS — BP 132/86 | HR 105 | Ht 65.0 in | Wt 227.0 lb

## 2020-05-25 DIAGNOSIS — G35 Multiple sclerosis: Secondary | ICD-10-CM

## 2020-05-25 DIAGNOSIS — E782 Mixed hyperlipidemia: Secondary | ICD-10-CM | POA: Insufficient documentation

## 2020-05-25 DIAGNOSIS — R002 Palpitations: Secondary | ICD-10-CM

## 2020-05-25 DIAGNOSIS — I10 Essential (primary) hypertension: Secondary | ICD-10-CM | POA: Diagnosis not present

## 2020-05-25 DIAGNOSIS — I209 Angina pectoris, unspecified: Secondary | ICD-10-CM

## 2020-05-25 DIAGNOSIS — E669 Obesity, unspecified: Secondary | ICD-10-CM

## 2020-05-25 HISTORY — DX: Mixed hyperlipidemia: E78.2

## 2020-05-25 HISTORY — DX: Obesity, unspecified: E66.9

## 2020-05-25 HISTORY — DX: Angina pectoris, unspecified: I20.9

## 2020-05-25 LAB — CBC WITH DIFFERENTIAL/PLATELET
Basophils Absolute: 0.1 10*3/uL (ref 0.0–0.2)
Basos: 1 %
EOS (ABSOLUTE): 0.2 10*3/uL (ref 0.0–0.4)
Eos: 2 %
Hematocrit: 38.5 % (ref 34.0–46.6)
Hemoglobin: 12.5 g/dL (ref 11.1–15.9)
Immature Grans (Abs): 0 10*3/uL (ref 0.0–0.1)
Immature Granulocytes: 0 %
Lymphocytes Absolute: 2.5 10*3/uL (ref 0.7–3.1)
Lymphs: 35 %
MCH: 23.9 pg — ABNORMAL LOW (ref 26.6–33.0)
MCHC: 32.5 g/dL (ref 31.5–35.7)
MCV: 74 fL — ABNORMAL LOW (ref 79–97)
Monocytes Absolute: 0.4 10*3/uL (ref 0.1–0.9)
Monocytes: 5 %
Neutrophils Absolute: 4 10*3/uL (ref 1.4–7.0)
Neutrophils: 57 %
Platelets: 386 10*3/uL (ref 150–450)
RBC: 5.23 x10E6/uL (ref 3.77–5.28)
RDW: 13.6 % (ref 11.7–15.4)
WBC: 7.1 10*3/uL (ref 3.4–10.8)

## 2020-05-25 LAB — BASIC METABOLIC PANEL
BUN/Creatinine Ratio: 14 (ref 9–23)
BUN: 12 mg/dL (ref 6–24)
CO2: 22 mmol/L (ref 20–29)
Calcium: 9.5 mg/dL (ref 8.7–10.2)
Chloride: 102 mmol/L (ref 96–106)
Creatinine, Ser: 0.88 mg/dL (ref 0.57–1.00)
GFR calc Af Amer: 92 mL/min/{1.73_m2} (ref >59)
GFR calc non Af Amer: 80 mL/min/{1.73_m2} (ref >59)
Glucose: 127 mg/dL — ABNORMAL HIGH (ref 65–99)
Potassium: 4.6 mmol/L (ref 3.5–5.2)
Sodium: 138 mmol/L (ref 134–144)

## 2020-05-25 MED ORDER — NITROGLYCERIN 0.4 MG SL SUBL: 0.4000 mg | SUBLINGUAL_TABLET | SUBLINGUAL | 6 refills | Status: DC | PRN

## 2020-05-25 MED ORDER — METOPROLOL SUCCINATE ER 25 MG PO TB24: 25.0000 mg | ORAL_TABLET | Freq: Every day | ORAL | 6 refills | Status: DC

## 2020-05-25 MED ORDER — ASPIRIN EC 81 MG PO TBEC: 81.0000 mg | DELAYED_RELEASE_TABLET | Freq: Every day | ORAL | 3 refills | Status: DC

## 2020-05-25 NOTE — Progress Notes (Signed)
Cardiology Office Note:    Date:  05/25/2020   ID:  Renee Brady, DOB [birth year], MRN [id number]  PCP:  [name], MD  Cardiologist:  Garwin Brothers, MD   Referring MD: [name], MD    ASSESSMENT:    1. Essential hypertension   2. Angina pectoris (HCC)   3. Mixed dyslipidemia   4. Multiple sclerosis (HCC)   5. Obesity (BMI 35.0-39.9 without comorbidity)    PLAN:    In order of problems listed above:  1. Angina pectoris: Patient symptoms are concerning. I discussed with her the following. I advised her to take a coated aspirin on a daily basis for now 81 mg. Sublingual nitroglycerin prescription was sent, its protocol and 911 protocol explained and the patient vocalized understanding questions were answered to the patient's satisfaction.In view of the patient's symptoms, I discussed with the patient options for evaluation. Invasive and noninvasive options were given to the patient. I discussed stress testing and coronary angiography and left heart catheterization at length. Benefits, pros and cons of each approach were discussed at length. Patient had multiple questions which were answered to the patient's satisfaction. Patient opted for invasive evaluation and we will set up for coronary angiography and left heart catheterization. Further recommendations will be made based on the findings with coronary angiography. In the interim if the patient has any significant symptoms in hospital to the nearest emergency room. I also discussed with the patient about CT coronary angiography with FFR but she prefers to conventional coronary angiography. Benefits and risks explained. 2. Essential hypertension: Blood pressure stable and diet was emphasized. Exercise modification urged. 3. Mixed dyslipidemia: Significantly elevated lipids in the month of October last year. Diet was emphasized. I will get more lab work done recently by primary care provider. 4. Obesity: Weight reduction  was stressed risks of obesity explained and he plans to do better. 5. Patient had multiple questions which were answered to his satisfaction. She was in follow-up appointment after the coronary angiography.   Medication Adjustments/Labs and Tests Ordered: Current medicines are reviewed at length with the patient today.  Concerns regarding medicines are outlined above.  No orders of the defined types were placed in this encounter.  No orders of the defined types were placed in this encounter.    History of Present Illness:    Renee Brady is a 45 y.o. female who is being seen today for the evaluation of chest pain at the request of [name], MD. Patient is a pleasant 45 year old female. She has history of multiple sclerosis, essential hypertension, mixed dyslipidemia and obesity. She mentions to me that in the past several weeks she is having chest tightness. When she exerts herself she feels tightness under her substernum. Radiation to the arm is present and she relaxes she feels better. No orthopnea or PND. At the time of my evaluation, the patient is alert awake oriented and in no distress. Her daughter accompanies her for this visit. She has not used nitroglycerin or aspirin.  Past Medical History:  Diagnosis Date  . ANA positive 06/26/2014   Formatting of this note might be different from the original. Overview:  Evaluated by Rheumatology Dr. Herma Carson - dx fibromyalgia given.  . Ankle pain, left 08/27/2014   Formatting of this note might be different from the original. Overview:  Fell on this day with injury to Lt foot and ankle.  . Anxiety and depression 04/03/2015  . Asthma   . Chronic pain of left knee 09/18/2014  Formatting of this note might be different from the original. Overview:  Evaluated by Ortho.  . Dysesthesia 01/26/2019  . Epidural fibrosis 06/15/2019  . Essential hypertension   . Fibromyalgia muscle pain   . GERD (gastroesophageal reflux disease)   . High risk  medication use 01/29/2018  . Hyperlipidemia   . Hypertension   . Iron deficiency anemia   . Lumbar radiculopathy 01/26/2019  . 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 Nausea 08/23/2018  . Numbness and tingling 04/11/2014  . Other headache syndrome 01/29/2018  . Pes anserinus bursitis of right knee 07/24/2014   Formatting of this note might be different from the original. PT, Life-style modification, and NSAID PRN.  Marland Kitchen Raynaud's disease   . Trochanteric bursitis of right hip 07/13/2014   Formatting of this note might be different from the original. Evaluated by Ortho.  Tx with CSI and PT.  Marland Kitchen Urinary urgency 01/29/2018  . Vision abnormalities   . Vitamin D deficiency 07/28/2014   Formatting of this note might be different from the original. Daily supplement.    Past Surgical History:  Procedure Laterality Date  . ABDOMINAL HYSTERECTOMY    . BACK SURGERY    . CESAREAN SECTION  x 2  . TONSILLECTOMY    . WISDOM TOOTH EXTRACTION      Current Medications: Current Meds  Medication Sig  . Albuterol Sulfate 108 (90 Base) MCG/ACT AEPB Inhale 2 puffs into the lungs every 4 (four) hours as needed (shortness of breath).  Marland Kitchen amitriptyline (ELAVIL) 50 MG tablet Take 50 mg by mouth as needed for sleep.  Marland Kitchen diclofenac (VOLTAREN) 25 MG EC tablet Take 1 tablet by mouth daily as needed for mild pain.  . montelukast (SINGULAIR) 10 MG tablet Take 10 mg by mouth at bedtime.  Marland Kitchen NIFEdipine (ADALAT CC) 60 MG 24 hr tablet Take 60 mg by mouth daily.  . solifenacin (VESICARE) 5 MG tablet Take 5 mg by mouth as needed (overactive bladder).     Allergies:   Acetaminophen, Gabapentin, Latex, Meloxicam, Oxycodone hcl, Penicillin g, Penicillins, Percocet [oxycodone-acetaminophen], Prozac [fluoxetine hcl], Sulfa antibiotics, Tecfidera [dimethyl fumarate], Tramadol, and Zoloft [sertraline]   Social History   Socioeconomic History  . Marital status:  Married    Spouse name: Not on file  . Number of children: Not on file  . Years of education: Not on file  . Highest education level: Not on file  Occupational History  . Not on file  Tobacco Use  . Smoking status: Former Smoker    Quit date: 2005    Years since quitting: 17.1  . Smokeless tobacco: Never Used  Substance and Sexual Activity  . Alcohol use: No  . Drug use: Yes    Types: Marijuana  . Sexual activity: Yes    Birth control/protection: Surgical  Other Topics Concern  . Not on file  Social History Narrative  . Not on file   Social Determinants of Health   Financial Resource Strain: Not on file  Food Insecurity: Not on file  Transportation Needs: Not on file  Physical Activity: Not on file  Stress: Not on file  Social Connections: Not on file     Family History: The patient's family history includes ADD / ADHD in her father; Cardiomyopathy in her mother; Depression in her father; Diabetes in her brother and mother; Hypertension in her father and mother; Stroke in her mother.  ROS:  Please see the history of present illness.    All other systems reviewed and are negative.  EKGs/Labs/Other Studies Reviewed:    The following studies were reviewed today: EKG reveals sinus rhythm and nonspecific ST-T changes. Records from primary care physician's office and epic was reviewed   Recent Labs: 11/14/2019: Hemoglobin 13.1; Platelets 354  Recent Lipid Panel No results found for: CHOL, TRIG, HDL, CHOLHDL, VLDL, LDLCALC, LDLDIRECT  Physical Exam:    VS:  BP 132/86   Pulse (!) 105   Ht 5\' 5"  (1.651 m)   Wt 227 lb (103 kg)   SpO2 100%   BMI 37.77 kg/m     Wt Readings from Last 3 Encounters:  05/25/20 227 lb (103 kg)  05/11/20 228 lb (103.4 kg)  11/14/19 228 lb (103.4 kg)     GEN: Patient is in no acute distress HEENT: Normal NECK: No JVD; No carotid bruits LYMPHATICS: No lymphadenopathy CARDIAC: S1 S2 regular, 2/6 systolic murmur at the  apex. RESPIRATORY:  Clear to auscultation without rales, wheezing or rhonchi  ABDOMEN: Soft, non-tender, non-distended MUSCULOSKELETAL:  No edema; No deformity  SKIN: Warm and dry NEUROLOGIC:  Alert and oriented x 3 PSYCHIATRIC:  Normal affect    Signed, Garwin Brothers, MD  05/25/2020 10:41 AM    [facility]

## 2020-05-25 NOTE — H&P (View-Only) (Signed)
Cardiology Office Note:    Date:  05/25/2020   ID:  Renee Brady, DOB [birth year], MRN [id number]  PCP:  [name], MD  Cardiologist:  Garwin Brothers, MD   Referring MD: [name], MD    ASSESSMENT:    1. Essential hypertension   2. Angina pectoris (HCC)   3. Mixed dyslipidemia   4. Multiple sclerosis (HCC)   5. Obesity (BMI 35.0-39.9 without comorbidity)    PLAN:    In order of problems listed above:  1. Angina pectoris: Patient symptoms are concerning. I discussed with her the following. I advised her to take a coated aspirin on a daily basis for now 81 mg. Sublingual nitroglycerin prescription was sent, its protocol and 911 protocol explained and the patient vocalized understanding questions were answered to the patient's satisfaction.In view of the patient's symptoms, I discussed with the patient options for evaluation. Invasive and noninvasive options were given to the patient. I discussed stress testing and coronary angiography and left heart catheterization at length. Benefits, pros and cons of each approach were discussed at length. Patient had multiple questions which were answered to the patient's satisfaction. Patient opted for invasive evaluation and we will set up for coronary angiography and left heart catheterization. Further recommendations will be made based on the findings with coronary angiography. In the interim if the patient has any significant symptoms in hospital to the nearest emergency room. I also discussed with the patient about CT coronary angiography with FFR but she prefers to conventional coronary angiography. Benefits and risks explained. 2. Essential hypertension: Blood pressure stable and diet was emphasized. Exercise modification urged. 3. Mixed dyslipidemia: Significantly elevated lipids in the month of October last year. Diet was emphasized. I will get more lab work done recently by primary care provider. 4. Obesity: Weight reduction  was stressed risks of obesity explained and he plans to do better. 5. Patient had multiple questions which were answered to his satisfaction. She was in follow-up appointment after the coronary angiography.   Medication Adjustments/Labs and Tests Ordered: Current medicines are reviewed at length with the patient today.  Concerns regarding medicines are outlined above.  No orders of the defined types were placed in this encounter.  No orders of the defined types were placed in this encounter.    History of Present Illness:    Renee Brady is a 45 y.o. female who is being seen today for the evaluation of chest pain at the request of [name], MD. Patient is a pleasant 45 year old female. She has history of multiple sclerosis, essential hypertension, mixed dyslipidemia and obesity. She mentions to me that in the past several weeks she is having chest tightness. When she exerts herself she feels tightness under her substernum. Radiation to the arm is present and she relaxes she feels better. No orthopnea or PND. At the time of my evaluation, the patient is alert awake oriented and in no distress. Her daughter accompanies her for this visit. She has not used nitroglycerin or aspirin.  Past Medical History:  Diagnosis Date  . ANA positive 06/26/2014   Formatting of this note might be different from the original. Overview:  Evaluated by Rheumatology Dr. Herma Carson - dx fibromyalgia given.  . Ankle pain, left 08/27/2014   Formatting of this note might be different from the original. Overview:  Fell on this day with injury to Lt foot and ankle.  . Anxiety and depression 04/03/2015  . Asthma   . Chronic pain of left knee 09/18/2014  Formatting of this note might be different from the original. Overview:  Evaluated by Ortho.  . Dysesthesia 01/26/2019  . Epidural fibrosis 06/15/2019  . Essential hypertension   . Fibromyalgia muscle pain   . GERD (gastroesophageal reflux disease)   . High risk  medication use 01/29/2018  . Hyperlipidemia   . Hypertension   . Iron deficiency anemia   . Lumbar radiculopathy 01/26/2019  . 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 Nausea 08/23/2018  . Numbness and tingling 04/11/2014  . Other headache syndrome 01/29/2018  . Pes anserinus bursitis of right knee 07/24/2014   Formatting of this note might be different from the original. PT, Life-style modification, and NSAID PRN.  Marland Kitchen Raynaud's disease   . Trochanteric bursitis of right hip 07/13/2014   Formatting of this note might be different from the original. Evaluated by Ortho.  Tx with CSI and PT.  Marland Kitchen Urinary urgency 01/29/2018  . Vision abnormalities   . Vitamin D deficiency 07/28/2014   Formatting of this note might be different from the original. Daily supplement.    Past Surgical History:  Procedure Laterality Date  . ABDOMINAL HYSTERECTOMY    . BACK SURGERY    . CESAREAN SECTION  x 2  . TONSILLECTOMY    . WISDOM TOOTH EXTRACTION      Current Medications: Current Meds  Medication Sig  . Albuterol Sulfate 108 (90 Base) MCG/ACT AEPB Inhale 2 puffs into the lungs every 4 (four) hours as needed (shortness of breath).  Marland Kitchen amitriptyline (ELAVIL) 50 MG tablet Take 50 mg by mouth as needed for sleep.  Marland Kitchen diclofenac (VOLTAREN) 25 MG EC tablet Take 1 tablet by mouth daily as needed for mild pain.  . montelukast (SINGULAIR) 10 MG tablet Take 10 mg by mouth at bedtime.  Marland Kitchen NIFEdipine (ADALAT CC) 60 MG 24 hr tablet Take 60 mg by mouth daily.  . solifenacin (VESICARE) 5 MG tablet Take 5 mg by mouth as needed (overactive bladder).     Allergies:   Acetaminophen, Gabapentin, Latex, Meloxicam, Oxycodone hcl, Penicillin g, Penicillins, Percocet [oxycodone-acetaminophen], Prozac [fluoxetine hcl], Sulfa antibiotics, Tecfidera [dimethyl fumarate], Tramadol, and Zoloft [sertraline]   Social History   Socioeconomic History  . Marital status:  Married    Spouse name: Not on file  . Number of children: Not on file  . Years of education: Not on file  . Highest education level: Not on file  Occupational History  . Not on file  Tobacco Use  . Smoking status: Former Smoker    Quit date: 2005    Years since quitting: 17.1  . Smokeless tobacco: Never Used  Substance and Sexual Activity  . Alcohol use: No  . Drug use: Yes    Types: Marijuana  . Sexual activity: Yes    Birth control/protection: Surgical  Other Topics Concern  . Not on file  Social History Narrative  . Not on file   Social Determinants of Health   Financial Resource Strain: Not on file  Food Insecurity: Not on file  Transportation Needs: Not on file  Physical Activity: Not on file  Stress: Not on file  Social Connections: Not on file     Family History: The patient's family history includes ADD / ADHD in her father; Cardiomyopathy in her mother; Depression in her father; Diabetes in her brother and mother; Hypertension in her father and mother; Stroke in her mother.  ROS:  Please see the history of present illness.    All other systems reviewed and are negative.  EKGs/Labs/Other Studies Reviewed:    The following studies were reviewed today: EKG reveals sinus rhythm and nonspecific ST-T changes. Records from primary care physician's office and epic was reviewed   Recent Labs: 11/14/2019: Hemoglobin 13.1; Platelets 354  Recent Lipid Panel No results found for: CHOL, TRIG, HDL, CHOLHDL, VLDL, LDLCALC, LDLDIRECT  Physical Exam:    VS:  BP 132/86   Pulse (!) 105   Ht 5\' 5"  (1.651 m)   Wt 227 lb (103 kg)   SpO2 100%   BMI 37.77 kg/m     Wt Readings from Last 3 Encounters:  05/25/20 227 lb (103 kg)  05/11/20 228 lb (103.4 kg)  11/14/19 228 lb (103.4 kg)     GEN: Patient is in no acute distress HEENT: Normal NECK: No JVD; No carotid bruits LYMPHATICS: No lymphadenopathy CARDIAC: S1 S2 regular, 2/6 systolic murmur at the  apex. RESPIRATORY:  Clear to auscultation without rales, wheezing or rhonchi  ABDOMEN: Soft, non-tender, non-distended MUSCULOSKELETAL:  No edema; No deformity  SKIN: Warm and dry NEUROLOGIC:  Alert and oriented x 3 PSYCHIATRIC:  Normal affect    Signed, Garwin Brothers, MD  05/25/2020 10:41 AM    [facility]

## 2020-05-25 NOTE — Patient Instructions (Signed)
Medication Instructions:  Your physician has recommended you make the following change in your medication:   Take 81 mg coated aspirin daily. Take Toprol XL 25 mg daily. Use nitroglycerin as needed for chest pain.  *If you need a refill on your cardiac medications before your next appointment, please call your pharmacy*   Lab Work: Your physician recommends that you have a BMET and CBC today in the office for your upcoming procedure.  If you have labs (blood work) drawn today and your tests are completely normal, you will receive your results only by: Marland Kitchen MyChart Message (if you have MyChart) OR . A paper copy in the mail If you have any lab test that is abnormal or we need to change your treatment, we will call you to review the results.   Testing/Procedures:    [facility] [place] [address] [place] [address] Dept: [phone] Loc: [phone]  Natanya Holecek  05/25/2020  You are scheduled for a Cardiac Catheterization on Friday, February 25 with Dr. Lance Muss.  1. Please arrive at the [facility] (Main Entrance A) at [facility]: [address] [place], [place] [address] at 10:00 AM (This time is two hours before your procedure to ensure your preparation). Free valet parking service is available.   Special note: Every effort is made to have your procedure done on time. Please understand that emergencies sometimes delay scheduled procedures.  2. Diet: Do not eat solid foods after midnight.  The patient may have clear liquids until 5am upon the day of the procedure.  3. Labs: You had your labs done it the office.  4. Medication instructions in preparation for your procedure:   Contrast Allergy: No  Stop taking, Voltaren or Cataflam or Arthrotec (Diclofenac) Friday, February 25,   On the morning of your procedure, take your Aspirin and any morning medicines NOT listed above.   You may use sips of water.  5. Plan for one night stay--bring personal belongings. 6. Bring a current list of your medications and current insurance cards. 7. You MUST have a responsible person to drive you home. 8. Someone MUST be with you the first 24 hours after you arrive home or your discharge will be delayed. 9. Please wear clothes that are easy to get on and off and wear slip-on shoes.  Thank you for allowing [place] to care for you!   -- [place] [facility]    Follow-Up: At [facility], you and your health needs are our priority.  As part of our continuing mission to provide you with exceptional heart care, we have created designated Provider Care Teams.  These Care Teams include your primary Cardiologist (physician) and Advanced Practice Providers (APPs -  Physician Assistants and Nurse Practitioners) who all work together to provide you with the care you need, when you need it.  We recommend signing up for the patient portal called "MyChart".  Sign up information is provided on this After Visit Summary.  MyChart is used to connect with patients for Virtual Visits (Telemedicine).  Patients are able to view lab/test results, encounter notes, upcoming appointments, etc.  Non-urgent messages can be sent to your provider as well.   To learn more about what you can do with MyChart, go to [email or web address].    Your next appointment:   1 month(s)  The format for your next appointment:   In Person  Provider:   Belva Crome, MD  Other Instructions  Coronary Angiogram With Stent Coronary angiogram with stent placement is a procedure to widen or open a narrow blood vessel of the heart (coronary artery). Arteries may become blocked by cholesterol buildup (plaques) in the lining of the artery wall. When a coronary artery becomes partially blocked, blood flow to that area decreases. This may lead to chest pain or a heart attack (myocardial infarction). A  stent is a small piece of metal that looks like mesh or spring. Stent placement may be done as treatment after a heart attack, or to prevent a heart attack if a blocked artery is found by a coronary angiogram. Let your health care provider know about:  Any allergies you have, including allergies to medicines or contrast dye.  All medicines you are taking, including vitamins, herbs, eye drops, creams, and over-the-counter medicines.  Any problems you or family members have had with anesthetic medicines.  Any blood disorders you have.  Any surgeries you have had.  Any medical conditions you have, including kidney problems or kidney failure.  Whether you are pregnant or may be pregnant.  Whether you are breastfeeding. What are the risks? Generally, this is a safe procedure. However, serious problems may occur, including: 1. Damage to nearby structures or organs, such as the heart, blood vessels, or kidneys. 2. A return of blockage. 3. Bleeding, infection, or bruising at the insertion site. 4. A collection of blood under the skin (hematoma) at the insertion site. 5. A blood clot in another part of the body. 6. Allergic reaction to medicines or dyes. 7. Bleeding into the abdomen (retroperitoneal bleeding). 8. Stroke (rare). 9. Heart attack (rare). What happens before the procedure? Staying hydrated Follow instructions from your health care provider about hydration, which may include: 1. Up to 2 hours before the procedure - you may continue to drink clear liquids, such as water, clear fruit juice, black coffee, and plain tea.    Eating and drinking restrictions Follow instructions from your health care provider about eating and drinking, which may include: 1. 8 hours before the procedure - stop eating heavy meals or foods, such as meat, fried foods, or fatty foods. 2. 6 hours before the procedure - stop eating light meals or foods, such as toast or cereal. 3. 2 hours before the  procedure - stop drinking clear liquids. Medicines Ask your health care provider about: 1. Changing or stopping your regular medicines. This is especially important if you are taking diabetes medicines or blood thinners. 2. Taking medicines such as aspirin and ibuprofen. These medicines can thin your blood. Do not take these medicines unless your health care provider tells you to take them. ? Generally, aspirin is recommended before a thin tube, called a catheter, is passed through a blood vessel and inserted into the heart (cardiac catheterization). 3. Taking over-the-counter medicines, vitamins, herbs, and supplements. General instructions 1. Do not use any products that contain nicotine or tobacco for at least 4 weeks before the procedure. These products include cigarettes, e-cigarettes, and chewing tobacco. If you need help quitting, ask your health care provider. 2. Plan to have someone take you home from the hospital or clinic. 3. If you will be going home right after the procedure, plan to have someone with you for 24 hours. 4. You may have tests and imaging procedures. 5. Ask your health care provider: 1. How your insertion site will be marked. Ask which artery will be used for the procedure. 2. What steps will be taken to  help prevent infection. These may include:  Removing hair at the insertion site.  Washing skin with a germ-killing soap.  Taking antibiotic medicine. What happens during the procedure? 1. An IV will be inserted into one of your veins. 2. Electrodes may be placed on your chest to monitor your heart rate during the procedure. 3. You will be given one or more of the following: ? A medicine to help you relax (sedative). ? A medicine to numb the area (local anesthetic) for catheter insertion. 4. A small incision will be made for catheter insertion. 5. The catheter will be inserted into an artery using a guide wire. The location may be in your groin, your wrist, or  the fold of your arm (near your elbow). 6. An X-ray procedure (fluoroscopy) will be used to help guide the catheter to the opening of the heart arteries. 7. A dye will be injected into the catheter. X-rays will be taken. The dye helps to show where any narrowing or blockages are located in the arteries. 8. Tell your health care provider if you have chest pain or trouble breathing. 9. A tiny wire will be guided to the blocked spot, and a balloon will be inflated to make the artery wider. 10. The stent will be expanded to crush the plaques into the wall of the vessel. The stent will hold the area open and improve the blood flow. Most stents have a drug coating to reduce the risk of the stent narrowing over time. 11. The artery may be made wider using a drill, laser, or other tools that remove plaques. 12. The catheter will be removed when the blood flow improves. The stent will stay where it was placed, and the lining of the artery will grow over it. 13. A bandage (dressing) will be placed on the insertion site. Pressure will be applied to stop bleeding. 14. The IV will be removed. This procedure may vary among health care providers and hospitals.    What happens after the procedure?  Your blood pressure, heart rate, breathing rate, and blood oxygen level will be monitored until you leave the hospital or clinic.  If the procedure is done through the leg, you will lie flat in bed for a few hours or for as long as told by your health care provider. You will be instructed not to bend or cross your legs.  The insertion site and the pulse in your foot or wrist will be checked often.  You may have more blood tests, X-rays, and a test that records the electrical activity of your heart (electrocardiogram, or ECG).  Do not drive for 24 hours if you were given a sedative during your procedure. Summary  Coronary angiogram with stent placement is a procedure to widen or open a narrowed coronary artery.  This is done to treat heart problems.  Before the procedure, let your health care provider know about all the medical conditions and surgeries you have or have had.  This is a safe procedure. However, some problems may occur, including damage to nearby structures or organs, bleeding, blood clots, or allergies.  Follow your health care provider's instructions about eating, drinking, medicines, and other lifestyle changes, such as quitting tobacco use before the procedure. This information is not intended to replace advice given to you by your health care provider. Make sure you discuss any questions you have with your health care provider. Document Revised: 10/13/2018 Document Reviewed: 10/13/2018 Elsevier Patient Education  2021 Elsevier Inc.  Aspirin  and Your Heart Aspirin is a medicine that prevents the platelets in your blood from sticking together. Platelets are the cells that your blood uses for clotting. Aspirin can be used to help reduce the risk of blood clots, heart attacks, and other heart-related problems. What are the risks? Daily use of aspirin can cause side effects. Some of these include:  Bleeding. Bleeding can be minor or serious. An example of minor bleeding is bleeding from a cut, and the bleeding does not stop. An example of more serious bleeding is stomach bleeding or, rarely, bleeding into the brain. Your risk of bleeding increases if you are also taking NSAIDs, such as ibuprofen.  Increased bruising.  Upset stomach.  An allergic reaction. People who have growths inside the nose (nasal polyps) have an increased risk of developing an aspirin allergy. How to use aspirin to care for your heart 10. Take aspirin only as told by your health care provider. Make sure that you understand how much to take and what form to take. The two forms of aspirin are: 1. Non-enteric-coated.This type of aspirin does not have a coating and is absorbed quickly. This type of aspirin also comes  in a chewable form. 2. Enteric-coated. This type of aspirin has a coating that releases the medicine very slowly. Enteric-coated aspirin might cause less stomach upset than non-enteric-coated aspirin. This type of aspirin should not be chewed or crushed. 11. Work with your health care provider to find out whether it is safe and beneficial for you to take aspirin daily. Taking aspirin daily may be helpful if: 1. You have had a heart attack or chest pain, or you are at risk for a heart attack. 2. You have a condition in which certain heart vessels are blocked (coronary artery disease), and you have had a procedure to treat it. Examples are:  Open-heart surgery, such as coronary artery bypass surgery (CABG).  Coronary angioplasty,which is done to widen a blood vessel of your heart.  Having a small mesh tube, or stent, placed in your coronary artery. 3. You have had certain types of stroke or a mini-stroke known as a transient ischemic attack (TIA). 4. You have a narrowing of the arteries that supply the limbs (peripheral artery disease, or PAD). 5. You have long-term (chronic) heart rhythm problems, such as atrial fibrillation, and your health care provider thinks aspirin may help. 6. You have valve disease or have had surgery on a valve. 7. You are considered at increased risk of developing coronary artery disease or PAD.    Follow these instructions at home Medicines 2. Take over-the-counter and prescription medicines only as told by your health care provider. 3. If you are taking blood thinners: ? Talk with your health care provider before you take any medicines that contain aspirin or NSAIDs, such as ibuprofen. These medicines increase your risk for dangerous bleeding. ? Take your medicine exactly as told, at the same time every day. ? Avoid activities that could cause injury or bruising, and follow instructions about how to prevent falls. ? Wear a medical alert bracelet or carry a card that  lists what medicines you take. General instructions 4. Do not drink alcohol if: 1. Your health care provider tells you not to drink. 2. You are pregnant, may be pregnant, or are planning to become pregnant. 5. If you drink alcohol: 1. Limit how much you use to:  0-1 drink a day for women.  0-2 drinks a day for men. 2. Be aware of  how much alcohol is in your drink. In the [place] one drink equals one 12 oz bottle of beer (355 mL), one 5 oz glass of wine (148 mL), or one 1 oz glass of hard liquor (44 mL). 6. Keep all follow-up visits as told by your health care provider. This is important. Where to find more information 4. The American Heart Association: www.heart.org Contact a health care provider if you have: 6. Unusual bleeding or bruising. 7. Stomach pain or nausea. 8. Ringing in your ears. 9. An allergic reaction that causes hives, itchy skin, or swelling of the lips, tongue, or face. Get help right away if: 15. You notice that your bowel movements are bloody, or dark red or black in color. 16. You vomit or cough up blood. 17. You have blood in your urine. 18. You cough, breathe loudly (wheeze), or feel short of breath. 19. You have chest pain, especially if the pain spreads to your arms, back, neck, or jaw. 20. You have a headache with confusion. You have any symptoms of a stroke. "BE FAST" is an easy way to remember the main warning signs of a stroke:  B - Balance. Signs are dizziness, sudden trouble walking, or loss of balance.  E - Eyes. Signs are trouble seeing or a sudden change in vision.  F - Face. Signs are sudden weakness or numbness of the face, or the face or eyelid drooping on one side.  A - Arms. Signs are weakness or numbness in an arm. This happens suddenly and usually on one side of the body.  S - Speech. Signs are sudden trouble speaking, slurred speech, or trouble understanding what people say.  T - Time. Time to call emergency services. Write down what  time symptoms started. You have other signs of a stroke, such as:  A sudden, severe headache with no known cause.  Nausea or vomiting.  Seizure. These symptoms may represent a serious problem that is an emergency. Do not wait to see if the symptoms will go away. Get medical help right away. Call your local emergency services (911 in the [place] Do not drive yourself to the hospital. Summary  Aspirin use can help reduce the risk of blood clots, heart attacks, and other heart-related problems.  Daily use of aspirin can cause side effects.  Take aspirin only as told by your health care provider. Make sure that you understand how much to take and what form to take.  Your health care provider will help you determine whether it is safe and beneficial for you to take aspirin daily. This information is not intended to replace advice given to you by your health care provider. Make sure you discuss any questions you have with your health care provider. Document Revised: 12/27/2018 Document Reviewed: 12/27/2018 Elsevier Patient Education  2021 Elsevier Inc. Nitroglycerin sublingual tablets What is this medicine? NITROGLYCERIN (nye troe GLI ser in) is a type of vasodilator. It relaxes blood vessels, increasing the blood and oxygen supply to your heart. This medicine is used to relieve chest pain caused by angina. It is also used to prevent chest pain before activities like climbing stairs, going outdoors in cold weather, or sexual activity. This medicine may be used for other purposes; ask your health care provider or pharmacist if you have questions. COMMON BRAND NAME(S): Nitroquick, Nitrostat, Nitrotab What should I tell my health care provider before I take this medicine? They need to know if you have any of these conditions:  anemia  head injury, recent stroke, or bleeding in the brain  liver disease  previous heart attack  an unusual or allergic reaction to nitroglycerin, other  medicines, foods, dyes, or preservatives  pregnant or trying to get pregnant  breast-feeding How should I use this medicine? Take this medicine by mouth as needed. Use at the first sign of an angina attack (chest pain or tightness). You can also take this medicine 5 to 10 minutes before an event likely to produce chest pain. Follow the directions exactly as written on the prescription label. Place one tablet under your tongue and let it dissolve. Do not swallow whole. Replace the dose if you accidentally swallow it. It will help if your mouth is not dry. Saliva around the tablet will help it to dissolve more quickly. Do not eat or drink, smoke or chew tobacco while a tablet is dissolving. Sit down when taking this medicine. In an angina attack, you should feel better within 5 minutes after your first dose. You can take a dose every 5 minutes up to a total of 3 doses. If you do not feel better or feel worse after 1 dose, call 9-1-1 at once. Do not take more than 3 doses in 15 minutes. Your health care provider might give you other directions. Follow those directions if he or she does. Do not take your medicine more often than directed. Talk to your health care provider about the use of this medicine in children. Special care may be needed. Overdosage: If you think you have taken too much of this medicine contact a poison control center or emergency room at once. NOTE: This medicine is only for you. Do not share this medicine with others. What if I miss a dose? This does not apply. This medicine is only used as needed. What may interact with this medicine? Do not take this medicine with any of the following medications: 12. certain migraine medicines like ergotamine and dihydroergotamine (DHE) 13. medicines used to treat erectile dysfunction like sildenafil, tadalafil, and vardenafil 14. riociguat This medicine may also interact with the following  medications: 4. alteplase 5. aspirin 6. heparin 7. medicines for high blood pressure 8. medicines for mental depression 9. other medicines used to treat angina 10. phenothiazines like chlorpromazine, mesoridazine, prochlorperazine, thioridazine This list may not describe all possible interactions. Give your health care provider a list of all the medicines, herbs, non-prescription drugs, or dietary supplements you use. Also tell them if you smoke, drink alcohol, or use illegal drugs. Some items may interact with your medicine. What should I watch for while using this medicine? Tell your doctor or health care professional if you feel your medicine is no longer working. Keep this medicine with you at all times. Sit or lie down when you take your medicine to prevent falling if you feel dizzy or faint after using it. Try to remain calm. This will help you to feel better faster. If you feel dizzy, take several deep breaths and lie down with your feet propped up, or bend forward with your head resting between your knees. You may get drowsy or dizzy. Do not drive, use machinery, or do anything that needs mental alertness until you know how this drug affects you. Do not stand or sit up quickly, especially if you are an older patient. This reduces the risk of dizzy or fainting spells. Alcohol can make you more drowsy and dizzy. Avoid alcoholic drinks. Do not treat yourself for coughs, colds, or pain while you  are taking this medicine without asking your doctor or health care professional for advice. Some ingredients may increase your blood pressure. What side effects may I notice from receiving this medicine? Side effects that you should report to your doctor or health care professional as soon as possible: 7. allergic reactions (skin rash, itching or hives; swelling of the face, lips, or tongue) 8. low blood pressure (dizziness; feeling faint or lightheaded, falls; unusually weak or tired) 9. low red blood  cell counts (trouble breathing; feeling faint; lightheaded, falls; unusually weak or tired) Side effects that usually do not require medical attention (report to your doctor or health care professional if they continue or are bothersome): 5. facial flushing (redness) 6. headache 7. nausea, vomiting This list may not describe all possible side effects. Call your doctor for medical advice about side effects. You may report side effects to FDA at 1-800-FDA-1088. Where should I keep my medicine? Keep out of the reach of children. Store at room temperature between 20 and 25 degrees C (68 and 77 degrees F). Store in Retail buyer. Protect from light and moisture. Keep tightly closed. Throw away any unused medicine after the expiration date. NOTE: This sheet is a summary. It may not cover all possible information. If you have questions about this medicine, talk to your doctor, pharmacist, or health care provider.  2021 Elsevier/Gold Standard (2017-12-23 16:46:32)

## 2020-05-28 ENCOUNTER — Telehealth: Payer: Self-pay

## 2020-05-28 NOTE — Telephone Encounter (Signed)
Updated patient medication list per his message in Epic.

## 2020-05-30 ENCOUNTER — Telehealth: Payer: Self-pay

## 2020-05-30 ENCOUNTER — Other Ambulatory Visit (HOSPITAL_COMMUNITY): Payer: Medicare Other

## 2020-05-30 NOTE — Telephone Encounter (Signed)
Called and left message for pt to call back.

## 2020-06-06 ENCOUNTER — Other Ambulatory Visit (HOSPITAL_COMMUNITY)
Admission: RE | Admit: 2020-06-06 | Discharge: 2020-06-06 | Disposition: A | Discharge status: Home / Self Care | Payer: Medicare Other | Source: Ambulatory Visit | Attending: Cardiovascular Disease | Admitting: Cardiovascular Disease

## 2020-06-06 DIAGNOSIS — Z01812 Encounter for preprocedural laboratory examination: Secondary | ICD-10-CM | POA: Diagnosis present

## 2020-06-06 DIAGNOSIS — Z20822 Contact with and (suspected) exposure to covid-19: Secondary | ICD-10-CM | POA: Diagnosis not present

## 2020-06-06 LAB — SARS CORONAVIRUS 2 (TAT 6-24 HRS): SARS Coronavirus 2: NEGATIVE

## 2020-06-07 ENCOUNTER — Telehealth: Payer: Self-pay | Admitting: *Deleted

## 2020-06-07 NOTE — Telephone Encounter (Signed)
Pt contacted pre-catheterization scheduled at [facility] for: Friday June 08, 2020 10:30 AM Verified arrival time and place: [facility] Main Entrance A [facility]) at: 8:30 AM   No solid food after midnight prior to cath, clear liquids until 5 AM day of procedure.  AM meds can be  taken pre-cath with sips of water including: ASA 81 mg   Confirmed patient has responsible adult to drive home post procedure and be with patient first 24 hours after arriving home: yes  You are allowed ONE visitor in the waiting room during the time you are at the hospital for your procedure. Both you and your visitor must wear a mask once you enter the hospital.   Reviewed procedure/mask/visitor instructions with patient.

## 2020-06-08 ENCOUNTER — Other Ambulatory Visit: Payer: Self-pay

## 2020-06-08 ENCOUNTER — Telehealth: Payer: Self-pay

## 2020-06-08 ENCOUNTER — Ambulatory Visit (HOSPITAL_COMMUNITY)
Admission: RE | Admit: 2020-06-08 | Discharge: 2020-06-08 | Disposition: A | Discharge status: Home / Self Care | Payer: Medicare Other | Source: Ambulatory Visit | Attending: Cardiovascular Disease | Admitting: Cardiovascular Disease

## 2020-06-08 ENCOUNTER — Encounter (HOSPITAL_COMMUNITY)
Admission: RE | Disposition: A | Discharge status: Home / Self Care | Payer: Self-pay | Source: Ambulatory Visit | Attending: Cardiovascular Disease

## 2020-06-08 DIAGNOSIS — I209 Angina pectoris, unspecified: Secondary | ICD-10-CM

## 2020-06-08 DIAGNOSIS — Z6837 Body mass index (BMI) 37.0-37.9, adult: Secondary | ICD-10-CM | POA: Diagnosis not present

## 2020-06-08 DIAGNOSIS — Z79899 Other long term (current) drug therapy: Secondary | ICD-10-CM | POA: Insufficient documentation

## 2020-06-08 DIAGNOSIS — M797 Fibromyalgia: Secondary | ICD-10-CM | POA: Insufficient documentation

## 2020-06-08 DIAGNOSIS — I10 Essential (primary) hypertension: Secondary | ICD-10-CM | POA: Diagnosis not present

## 2020-06-08 DIAGNOSIS — Z888 Allergy status to other drugs, medicaments and biological substances status: Secondary | ICD-10-CM | POA: Diagnosis not present

## 2020-06-08 DIAGNOSIS — E782 Mixed hyperlipidemia: Secondary | ICD-10-CM | POA: Diagnosis not present

## 2020-06-08 DIAGNOSIS — Z885 Allergy status to narcotic agent status: Secondary | ICD-10-CM | POA: Insufficient documentation

## 2020-06-08 DIAGNOSIS — Z87891 Personal history of nicotine dependence: Secondary | ICD-10-CM | POA: Insufficient documentation

## 2020-06-08 DIAGNOSIS — Z886 Allergy status to analgesic agent status: Secondary | ICD-10-CM | POA: Diagnosis not present

## 2020-06-08 DIAGNOSIS — R002 Palpitations: Secondary | ICD-10-CM

## 2020-06-08 DIAGNOSIS — I25119 Atherosclerotic heart disease of native coronary artery with unspecified angina pectoris: Secondary | ICD-10-CM | POA: Insufficient documentation

## 2020-06-08 DIAGNOSIS — G35 Multiple sclerosis: Secondary | ICD-10-CM | POA: Insufficient documentation

## 2020-06-08 DIAGNOSIS — Z880 Allergy status to penicillin: Secondary | ICD-10-CM | POA: Insufficient documentation

## 2020-06-08 DIAGNOSIS — Z8249 Family history of ischemic heart disease and other diseases of the circulatory system: Secondary | ICD-10-CM | POA: Insufficient documentation

## 2020-06-08 DIAGNOSIS — Z91040 Latex allergy status: Secondary | ICD-10-CM | POA: Insufficient documentation

## 2020-06-08 DIAGNOSIS — Z90710 Acquired absence of both cervix and uterus: Secondary | ICD-10-CM | POA: Diagnosis not present

## 2020-06-08 DIAGNOSIS — E669 Obesity, unspecified: Secondary | ICD-10-CM | POA: Insufficient documentation

## 2020-06-08 DIAGNOSIS — Z882 Allergy status to sulfonamides status: Secondary | ICD-10-CM | POA: Diagnosis not present

## 2020-06-08 HISTORY — PX: LEFT HEART CATH AND CORONARY ANGIOGRAPHY: CATH118249

## 2020-06-08 SURGERY — LEFT HEART CATH AND CORONARY ANGIOGRAPHY
Anesthesia: LOCAL

## 2020-06-08 MED ORDER — SODIUM CHLORIDE 0.9 % WEIGHT BASED INFUSION
3.0000 mL/kg/h | INTRAVENOUS | Status: AC
  Administered 2020-06-08: 3 mL/kg/h via INTRAVENOUS

## 2020-06-08 MED ORDER — SODIUM CHLORIDE 0.9% FLUSH: 3.0000 mL | Freq: Two times a day (BID) | INTRAVENOUS | Status: DC

## 2020-06-08 MED ORDER — SODIUM CHLORIDE 0.9% FLUSH: 3.0000 mL | INTRAVENOUS | Status: DC | PRN

## 2020-06-08 MED ORDER — ASPIRIN 81 MG PO CHEW: 81.0000 mg | CHEWABLE_TABLET | ORAL | Status: DC

## 2020-06-08 MED ORDER — HEPARIN SODIUM (PORCINE) 1000 UNIT/ML IJ SOLN
INTRAMUSCULAR | Status: AC
  Filled 2020-06-08: qty 1

## 2020-06-08 MED ORDER — LIDOCAINE HCL (PF) 1 % IJ SOLN
INTRAMUSCULAR | Status: AC
  Filled 2020-06-08: qty 30

## 2020-06-08 MED ORDER — SODIUM CHLORIDE 0.9 % IV SOLN: 250.0000 mL | INTRAVENOUS | Status: DC | PRN

## 2020-06-08 MED ORDER — ONDANSETRON HCL 4 MG/2ML IJ SOLN: 4.0000 mg | Freq: Four times a day (QID) | INTRAMUSCULAR | Status: DC | PRN

## 2020-06-08 MED ORDER — HEPARIN (PORCINE) IN NACL 1000-0.9 UT/500ML-% IV SOLN
INTRAVENOUS | Status: AC
  Filled 2020-06-08: qty 1000

## 2020-06-08 MED ORDER — HEPARIN SODIUM (PORCINE) 1000 UNIT/ML IJ SOLN
INTRAMUSCULAR | Status: DC | PRN
  Administered 2020-06-08: 5000 [IU] via INTRAVENOUS

## 2020-06-08 MED ORDER — VERAPAMIL HCL 2.5 MG/ML IV SOLN
INTRAVENOUS | Status: DC | PRN
  Administered 2020-06-08: 10 mL via INTRA_ARTERIAL

## 2020-06-08 MED ORDER — MIDAZOLAM HCL 2 MG/2ML IJ SOLN
INTRAMUSCULAR | Status: DC | PRN
  Administered 2020-06-08 (×2): 1 mg via INTRAVENOUS

## 2020-06-08 MED ORDER — HYDRALAZINE HCL 20 MG/ML IJ SOLN: 10.0000 mg | INTRAMUSCULAR | Status: DC | PRN

## 2020-06-08 MED ORDER — ISOSORBIDE MONONITRATE ER 30 MG PO TB24: 30.0000 mg | ORAL_TABLET | Freq: Every day | ORAL | 3 refills | Status: DC

## 2020-06-08 MED ORDER — VERAPAMIL HCL 2.5 MG/ML IV SOLN
INTRAVENOUS | Status: AC
  Filled 2020-06-08: qty 2

## 2020-06-08 MED ORDER — FENTANYL CITRATE (PF) 100 MCG/2ML IJ SOLN
INTRAMUSCULAR | Status: AC
  Filled 2020-06-08: qty 2

## 2020-06-08 MED ORDER — SODIUM CHLORIDE 0.9 % IV SOLN: INTRAVENOUS | Status: AC

## 2020-06-08 MED ORDER — FENTANYL CITRATE (PF) 100 MCG/2ML IJ SOLN
INTRAMUSCULAR | Status: DC | PRN
  Administered 2020-06-08 (×2): 25 ug via INTRAVENOUS

## 2020-06-08 MED ORDER — HEPARIN (PORCINE) IN NACL 1000-0.9 UT/500ML-% IV SOLN
INTRAVENOUS | Status: DC | PRN
  Administered 2020-06-08 (×2): 500 mL

## 2020-06-08 MED ORDER — LIDOCAINE HCL (PF) 1 % IJ SOLN
INTRAMUSCULAR | Status: DC | PRN
  Administered 2020-06-08: 1 mL

## 2020-06-08 MED ORDER — MIDAZOLAM HCL 2 MG/2ML IJ SOLN
INTRAMUSCULAR | Status: AC
  Filled 2020-06-08: qty 2

## 2020-06-08 MED ORDER — LABETALOL HCL 5 MG/ML IV SOLN: 10.0000 mg | INTRAVENOUS | Status: DC | PRN

## 2020-06-08 MED ORDER — SODIUM CHLORIDE 0.9 % WEIGHT BASED INFUSION: 1.0000 mL/kg/h | INTRAVENOUS | Status: DC

## 2020-06-08 SURGICAL SUPPLY — 12 items
BAG SNAP BAND KOVER 36X36 (MISCELLANEOUS) ×2 IMPLANT
CATH 5FR JL3.5 JR4 ANG PIG MP (CATHETERS) ×2 IMPLANT
COVER DOME SNAP 22 D (MISCELLANEOUS) ×2 IMPLANT
DEVICE RAD COMP TR BAND LRG (VASCULAR PRODUCTS) ×2 IMPLANT
GLIDESHEATH SLEND SS 6F .021 (SHEATH) ×2 IMPLANT
GUIDEWIRE INQWIRE 1.5J.035X260 (WIRE) ×1 IMPLANT
INQWIRE 1.5J .035X260CM (WIRE) ×2
KIT HEART LEFT (KITS) ×2 IMPLANT
PACK CARDIAC CATHETERIZATION (CUSTOM PROCEDURE TRAY) ×2 IMPLANT
SYR MEDRAD MARK 7 150ML (SYRINGE) ×2 IMPLANT
TRANSDUCER W/STOPCOCK (MISCELLANEOUS) ×2 IMPLANT
TUBING CIL FLEX 10 FLL-RA (TUBING) ×2 IMPLANT

## 2020-06-08 NOTE — Telephone Encounter (Signed)
-----   Message from Garwin Brothers, MD sent at 06/08/2020 12:48 PM EST ----- Ladonna Snide Imdur please. Thanks Thayer Ohm, thank you. I got this. 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 I would recommend medical management only. Her RCA, LAD and Circumflex are ok. Would you mind starting her on Imdur 30 mg daily? If she does not tolerate this, we could try Ranexa 500 mg po BID. I will include the [facility] nurses on this message and see if they can send in the prescription for the Imdur to her pharmacy. Thanks, Thayer Ohm

## 2020-06-08 NOTE — Interval H&P Note (Signed)
History and Physical Interval Note:  06/08/2020 9:34 AM  Renee Brady  has presented today for surgery, with the diagnosis of angina.  The various methods of treatment have been discussed with the patient and family. After consideration of risks, benefits and other options for treatment, the patient has consented to  Procedure(s): LEFT HEART CATH AND CORONARY ANGIOGRAPHY (N/A) as a surgical intervention.  The patient's history has been reviewed, patient examined, no change in status, stable for surgery.  I have reviewed the patient's chart and labs.  Questions were answered to the patient's satisfaction.    Cath Lab Visit (complete for each Cath Lab visit)  Clinical Evaluation Leading to the Procedure:   ACS: No.  Non-ACS:    Anginal Classification: CCS III  Anti-ischemic medical therapy: Minimal Therapy (1 class of medications)  Non-Invasive Test Results: No non-invasive testing performed  Prior CABG: No previous CABG       Renee Brady

## 2020-06-08 NOTE — Telephone Encounter (Signed)
RX sent to the pharmacy. Pt remains in the hospital at this time.

## 2020-06-08 NOTE — Discharge Instructions (Signed)
Radial Site Care  This sheet gives you information about how to care for yourself after your procedure. Your health care provider may also give you more specific instructions. If you have problems or questions, contact your health care provider. What can I expect after the procedure? After the procedure, it is common to have:  Bruising and tenderness at the catheter insertion area. Follow these instructions at home: Medicines  Take over-the-counter and prescription medicines only as told by your health care provider. Insertion site care  Follow instructions from your health care provider about how to take care of your insertion site. Make sure you: ? Wash your hands with soap and water before you change your bandage (dressing). If soap and water are not available, use hand sanitizer. ? Change your dressing as told by your health care provider. ? Leave stitches (sutures), skin glue, or adhesive strips in place. These skin closures may need to stay in place for 2 weeks or longer. If adhesive strip edges start to loosen and curl up, you may trim the loose edges. Do not remove adhesive strips completely unless your health care provider tells you to do that.  Check your insertion site every day for signs of infection. Check for: ? Redness, swelling, or pain. ? Fluid or blood. ? Pus or a bad smell. ? Warmth.  Do not take baths, swim, or use a hot tub until your health care provider approves.  You may shower 24-48 hours after the procedure, or as directed by your health care provider. ? Remove the dressing and gently wash the site with plain soap and water. ? Pat the area dry with a clean towel. ? Do not rub the site. That could cause bleeding.  Do not apply powder or lotion to the site. Activity  For 24 hours after the procedure, or as directed by your health care provider: ? Do not flex or bend the affected arm. ? Do not push or pull heavy objects with the affected arm. ? Do not drive  yourself home from the hospital or clinic. You may drive 24 hours after the procedure unless your health care provider tells you not to. ? Do not operate machinery or power tools.  Do not lift anything that is heavier than 10 lb (4.5 kg), or the limit that you are told, until your health care provider says that it is safe.  Ask your health care provider when it is okay to: ? Return to work or school. ? Resume usual physical activities or sports. ? Resume sexual activity.   General instructions  If the catheter site starts to bleed, raise your arm and put firm pressure on the site. If the bleeding does not stop, get help right away. This is a medical emergency.  If you went home on the same day as your procedure, a responsible adult should be with you for the first 24 hours after you arrive home.  Keep all follow-up visits as told by your health care provider. This is important. Contact a health care provider if:  You have a fever.  You have redness, swelling, or yellow drainage around your insertion site. Get help right away if:  You have unusual pain at the radial site.  The catheter insertion area swells very fast.  The insertion area is bleeding, and the bleeding does not stop when you hold steady pressure on the area.  Your arm or hand becomes pale, cool, tingly, or numb. These symptoms may represent a serious   problem that is an emergency. Do not wait to see if the symptoms will go away. Get medical help right away. Call your local emergency services (911 in the [place] Do not drive yourself to the hospital. Summary  After the procedure, it is common to have bruising and tenderness at the site.  Follow instructions from your health care provider about how to take care of your radial site wound. Check the wound every day for signs of infection.  Do not lift anything that is heavier than 10 lb (4.5 kg), or the limit that you are told, until your health care provider says that it  is safe. This information is not intended to replace advice given to you by your health care provider. Make sure you discuss any questions you have with your health care provider. Document Revised: 04/29/2017 Document Reviewed: 04/29/2017 Elsevier Patient Education  2021 [employer]  

## 2020-06-11 ENCOUNTER — Encounter (HOSPITAL_COMMUNITY): Payer: Self-pay | Admitting: Cardiovascular Disease

## 2020-07-02 ENCOUNTER — Ambulatory Visit: Payer: Medicare Other | Admitting: Cardiology

## 2020-07-02 ENCOUNTER — Other Ambulatory Visit: Payer: Self-pay

## 2020-07-03 ENCOUNTER — Other Ambulatory Visit: Payer: Self-pay

## 2020-07-03 ENCOUNTER — Ambulatory Visit (INDEPENDENT_AMBULATORY_CARE_PROVIDER_SITE_OTHER): Payer: Medicare Other | Admitting: Cardiology

## 2020-07-03 ENCOUNTER — Encounter: Payer: Self-pay | Admitting: Cardiology

## 2020-07-03 VITALS — BP 130/80 | HR 98 | Ht 65.0 in | Wt 215.8 lb

## 2020-07-03 DIAGNOSIS — E785 Hyperlipidemia, unspecified: Secondary | ICD-10-CM

## 2020-07-03 DIAGNOSIS — I2510 Atherosclerotic heart disease of native coronary artery without angina pectoris: Secondary | ICD-10-CM

## 2020-07-03 DIAGNOSIS — E782 Mixed hyperlipidemia: Secondary | ICD-10-CM

## 2020-07-03 DIAGNOSIS — I10 Essential (primary) hypertension: Secondary | ICD-10-CM | POA: Diagnosis not present

## 2020-07-03 DIAGNOSIS — I209 Angina pectoris, unspecified: Secondary | ICD-10-CM

## 2020-07-03 DIAGNOSIS — I25118 Atherosclerotic heart disease of native coronary artery with other forms of angina pectoris: Secondary | ICD-10-CM

## 2020-07-03 HISTORY — DX: Atherosclerotic heart disease of native coronary artery without angina pectoris: I25.10

## 2020-07-03 HISTORY — DX: Atherosclerotic heart disease of native coronary artery with other forms of angina pectoris: I25.118

## 2020-07-03 NOTE — Progress Notes (Signed)
Cardiology Office Note:    Date:  07/03/2020   ID:  Renee Brady, DOB October 29, 1975, MRN [id number]  PCP:  Blane Ohara, MD  Cardiologist:  Garwin Brothers, MD   Referring MD: Blane Ohara, MD    ASSESSMENT:    1. Essential hypertension   2. Hyperlipidemia, unspecified hyperlipidemia type   3. Mixed hyperlipidemia   4. Angina pectoris (HCC)   5. Coronary artery disease involving native coronary artery of native heart without angina pectoris    PLAN:    In order of problems listed above:  1. Coronary artery disease: Coronary angiography report is detailed below and I discussed this with the patient at length and questions were answered to her satisfaction.  I told her to walk at least 2 miles a day on a regular basis and lose weight and she promises to do so. 2. Mixed dyslipidemia: Diet was emphasized.  She will be back in the next few days for blood work for fasting lipids and I will start her on statin medications she is agreeable. 3. Essential hypertension: Blood pressure stable and diet was emphasized. 4. Obesity: Risks emphasized weight reduction was stressed and she is doing well on diet and exercise to achieve a target. 5. Patient will be seen in follow-up appointment in 6 months or earlier if the patient has any concerns.     Medication Adjustments/Labs and Tests Ordered: Current medicines are reviewed at length with the patient today.  Concerns regarding medicines are outlined above.  Orders Placed This Encounter  Procedures  . Basic metabolic panel  . Hepatic function panel  . Lipid panel   No orders of the defined types were placed in this encounter.    No chief complaint on file.    History of Present Illness:    Renee Brady is a 45 y.o. female.  Patient has past medical history of essential hypertension.  I sent her for coronary angiography prevention and she has small vessel disease.  Medical management was recommended.  Patient is now walking regularly  and has lost some significant amount of weight in the past few weeks.  No chest pain orthopnea or PND.  At the time of my evaluation, the patient is alert awake oriented and in no distress.  She is happy about it.  Past Medical History:  Diagnosis Date  . ANA positive 06/26/2014   Formatting of this note might be different from the original. Overview:  Evaluated by Rheumatology Dr. Herma Carson - dx fibromyalgia given.  . Angina pectoris (HCC) 05/25/2020  . Ankle pain, left 08/27/2014   Formatting of this note might be different from the original. Overview:  Fell on this day with injury to Lt foot and ankle.  . Anxiety and depression 04/03/2015  . Asthma   . Cervical radiculitis 10/23/2016  . Chronic pain of left knee 09/18/2014   Formatting of this note might be different from the original. Overview:  Evaluated by Ortho.  . Dysesthesia 01/26/2019  . Epidural fibrosis 06/15/2019  . Essential hypertension   . Fibromyalgia muscle pain   . GERD (gastroesophageal reflux disease)   . Herniated nucleus pulposus, cervical 10/23/2016  . High risk medication use 01/29/2018  . Hyperlipidemia   . Hypertension   . Iron deficiency anemia   . Lumbar radiculopathy 01/26/2019  . Mixed dyslipidemia 05/25/2020  . 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 Nausea 08/23/2018  . Numbness and tingling 04/11/2014  .  Obesity (BMI 35.0-39.9 without comorbidity) 05/25/2020  . Other headache syndrome 01/29/2018  . Pes anserinus bursitis of right knee 07/24/2014   Formatting of this note might be different from the original. PT, Life-style modification, and NSAID PRN.  Marland Kitchen Raynaud's disease   . Spinal stenosis in cervical region 10/23/2016  . Trochanteric bursitis of right hip 07/13/2014   Formatting of this note might be different from the original. Evaluated by Ortho.  Tx with CSI and PT.  Marland Kitchen Urinary urgency 01/29/2018  . Vision abnormalities   . Vitamin D  deficiency 07/28/2014   Formatting of this note might be different from the original. Daily supplement.    Past Surgical History:  Procedure Laterality Date  . ABDOMINAL HYSTERECTOMY    . BACK SURGERY    . CESAREAN SECTION  x 2  . LEFT HEART CATH AND CORONARY ANGIOGRAPHY N/A 06/08/2020   Procedure: LEFT HEART CATH AND CORONARY ANGIOGRAPHY;  Surgeon: Kathleene Hazel, MD;  Location: [facility];  Service: Cardiovascular;  Laterality: N/A;  . TONSILLECTOMY    . WISDOM TOOTH EXTRACTION      Current Medications: Current Meds  Medication Sig  . albuterol (VENTOLIN HFA) 108 (90 Base) MCG/ACT inhaler Inhale 2 puffs into the lungs as needed for wheezing or shortness of breath.  Marland Kitchen amitriptyline (ELAVIL) 50 MG tablet Take 50 mg by mouth at bedtime.  Marland Kitchen aspirin EC 81 MG tablet Take 1 tablet (81 mg total) by mouth daily. Swallow whole.  . cyclobenzaprine (FLEXERIL) 5 MG tablet Take 5 mg by mouth as needed for muscle spasms.  . diclofenac (VOLTAREN) 25 MG EC tablet Take 25 mg by mouth daily as needed for mild pain.  . hydrOXYzine (ATARAX/VISTARIL) 25 MG tablet Take 25 mg by mouth 3 (three) times daily as needed for itching.  . isosorbide mononitrate (IMDUR) 30 MG 24 hr tablet Take 1 tablet (30 mg total) by mouth daily.  . Melatonin 10 MG CAPS Take 10 mg by mouth at bedtime.  . metoprolol succinate (TOPROL XL) 25 MG 24 hr tablet Take 1 tablet (25 mg total) by mouth daily.  . montelukast (SINGULAIR) 10 MG tablet Take 10 mg by mouth at bedtime.  Marland Kitchen NIFEdipine (ADALAT CC) 60 MG 24 hr tablet Take 60 mg by mouth daily.  . nitroGLYCERIN (NITROSTAT) 0.4 MG SL tablet Place 0.4 mg under the tongue every 5 (five) minutes as needed for chest pain.  Marland Kitchen OXcarbazepine (TRILEPTAL) 150 MG tablet Take 150 mg by mouth as needed (headaches).     Allergies:   Gabapentin, Latex, Meloxicam, Oxycodone hcl, Percocet [oxycodone-acetaminophen], Prozac [fluoxetine hcl], Sulfa antibiotics, Tecfidera [dimethyl  fumarate], Tramadol, Zoloft [sertraline], Hydrochlorothiazide, and Penicillins   Social History   Socioeconomic History  . Marital status: Married    Spouse name: Not on file  . Number of children: Not on file  . Years of education: Not on file  . Highest education level: Not on file  Occupational History  . Not on file  Tobacco Use  . Smoking status: Former Smoker    Quit date: 2005    Years since quitting: 17.2  . Smokeless tobacco: Never Used  Substance and Sexual Activity  . Alcohol use: No  . Drug use: Not Currently    Types: Marijuana  . Sexual activity: Yes    Birth control/protection: Surgical  Other Topics Concern  . Not on file  Social History Narrative  . Not on file   Social Determinants of Health  Financial Resource Strain: Not on file  Food Insecurity: Not on file  Transportation Needs: Not on file  Physical Activity: Not on file  Stress: Not on file  Social Connections: Not on file     Family History: The patient's family history includes ADD / ADHD in her father; Cardiomyopathy in her mother; Depression in her father; Diabetes in her brother and mother; Hypertension in her father and mother; Stroke in her mother.  ROS:   Please see the history of present illness.    All other systems reviewed and are negative.  EKGs/Labs/Other Studies Reviewed:    The following studies were reviewed today: Procedures  LEFT HEART CATH AND CORONARY ANGIOGRAPHY    Conclusion    1st Diag lesion is 80% stenosed.  Prox LAD lesion is 30% stenosed.  The left ventricular systolic function is normal.  LV end diastolic pressure is normal.  The left ventricular ejection fraction is 55-65% by visual estimate.  There is no mitral valve regurgitation.   1. No left main disease 2. Mild mid LAD stenosis at the takeoff of the small first diagonal branch. The diagonal branch is small in caliber and has diffuse severe stenosis from the ostium through the mid  segment.  3. No disease noted in the large caliber Circumflex artery which supplies a large obtuse marginal branch 4. The RCA is a large dominant vessel with no obstructive disease.   Recommendations: Her major epicardial vessels have no obstructive disease. The small caliber diagonal branch that supplies a small segment of myocardium has severe, diffuse ostial/proximal and mid disease. This vessel is too small for PCI. Medical management of CAD.  I think she would benefit from anti-anginal therapy. We will add Imdur 30 mg daily. If she does not tolerate this, I would consider adding Ranexa.     Recent Labs: 05/25/2020: BUN 12; Creatinine, Ser 0.88; Hemoglobin 12.5; Platelets 386; Potassium 4.6; Sodium 138  Recent Lipid Panel No results found for: CHOL, TRIG, HDL, CHOLHDL, VLDL, LDLCALC, LDLDIRECT  Physical Exam:    VS:  BP 130/80   Pulse 98   Ht 5\' 5"  (1.651 m)   Wt 215 lb 12.8 oz (97.9 kg)   SpO2 97%   BMI 35.91 kg/m     Wt Readings from Last 3 Encounters:  07/03/20 215 lb 12.8 oz (97.9 kg)  06/08/20 222 lb (100.7 kg)  05/25/20 227 lb (103 kg)     GEN: Patient is in no acute distress HEENT: Normal NECK: No JVD; No carotid bruits LYMPHATICS: No lymphadenopathy CARDIAC: Hear sounds regular, 2/6 systolic murmur at the apex. RESPIRATORY:  Clear to auscultation without rales, wheezing or rhonchi  ABDOMEN: Soft, non-tender, non-distended MUSCULOSKELETAL:  No edema; No deformity  SKIN: Warm and dry NEUROLOGIC:  Alert and oriented x 3 PSYCHIATRIC:  Normal affect   Signed, Garwin Brothers, MD  07/03/2020 2:25 PM    [facility]

## 2020-07-03 NOTE — Patient Instructions (Addendum)
Medication Instructions:  No medication changes. *If you need a refill on your cardiac medications before your next appointment, please call your pharmacy*   Lab Work: Your physician recommends that you return for lab work in: before your next visit. You need to have labs done when you are fasting.  You can come Monday through Friday 8:30 am to 12:00 pm and 1:15 to 4:30. You do not need to make an appointment as the order has already been placed. The labs you are going to have done are BMET, LFT and Lipids.  If you have labs (blood work) drawn today and your tests are completely normal, you will receive your results only by: . MyChart Message (if you have MyChart) OR . A paper copy in the mail If you have any lab test that is abnormal or we need to change your treatment, we will call you to review the results.   Testing/Procedures: None ordered   Follow-Up: At [facility], you and your health needs are our priority.  As part of our continuing mission to provide you with exceptional heart care, we have created designated Provider Care Teams.  These Care Teams include your primary Cardiologist (physician) and Advanced Practice Providers (APPs -  Physician Assistants and Nurse Practitioners) who all work together to provide you with the care you need, when you need it.  We recommend signing up for the patient portal called "MyChart".  Sign up information is provided on this After Visit Summary.  MyChart is used to connect with patients for Virtual Visits (Telemedicine).  Patients are able to view lab/test results, encounter notes, upcoming appointments, etc.  Non-urgent messages can be sent to your provider as well.   To learn more about what you can do with MyChart, go to [email or web address].    Your next appointment:   4 month(s)  The format for your next appointment:   In Person  Provider:   Rajan Revankar, MD   Other Instructions NA  

## 2020-07-05 LAB — BASIC METABOLIC PANEL
BUN/Creatinine Ratio: 12 (ref 9–23)
BUN: 13 mg/dL (ref 6–24)
CO2: 20 mmol/L (ref 20–29)
Calcium: 9.5 mg/dL (ref 8.7–10.2)
Chloride: 98 mmol/L (ref 96–106)
Creatinine, Ser: 1.05 mg/dL — ABNORMAL HIGH (ref 0.57–1.00)
Glucose: 102 mg/dL — ABNORMAL HIGH (ref 65–99)
Potassium: 4.4 mmol/L (ref 3.5–5.2)
Sodium: 138 mmol/L (ref 134–144)
eGFR: 67 mL/min/{1.73_m2} (ref >59)

## 2020-07-05 LAB — HEPATIC FUNCTION PANEL
ALT: 13 IU/L (ref 0–32)
AST: 16 IU/L (ref 0–40)
Albumin: 4.1 g/dL (ref 3.8–4.8)
Alkaline Phosphatase: 102 IU/L (ref 44–121)
Bilirubin Total: 0.6 mg/dL (ref 0.0–1.2)
Bilirubin, Direct: 0.13 mg/dL (ref 0.00–0.40)
Total Protein: 6.9 g/dL (ref 6.0–8.5)

## 2020-07-05 LAB — LIPID PANEL
Chol/HDL Ratio: 5.1 ratio — ABNORMAL HIGH (ref 0.0–4.4)
Cholesterol, Total: 192 mg/dL (ref 100–199)
HDL: 38 mg/dL — ABNORMAL LOW (ref >39)
LDL Chol Calc (NIH): 132 mg/dL — ABNORMAL HIGH (ref 0–99)
Triglycerides: 120 mg/dL (ref 0–149)
VLDL Cholesterol Cal: 22 mg/dL (ref 5–40)

## 2020-07-06 MED ORDER — ROSUVASTATIN CALCIUM 10 MG PO TABS: 10.0000 mg | ORAL_TABLET | Freq: Every day | ORAL | 3 refills | Status: DC

## 2020-07-06 NOTE — Addendum Note (Signed)
Addended by: Eleonore Chiquito on: 07/06/2020 02:26 PM   Modules accepted: Orders

## 2020-07-24 ENCOUNTER — Other Ambulatory Visit: Payer: Self-pay

## 2020-07-24 ENCOUNTER — Encounter: Payer: Self-pay | Admitting: Family Medicine

## 2020-07-24 ENCOUNTER — Ambulatory Visit (INDEPENDENT_AMBULATORY_CARE_PROVIDER_SITE_OTHER): Payer: Medicare Other | Admitting: Family Medicine

## 2020-07-24 VITALS — BP 140/76 | HR 76 | Temp 97.7°F | Resp 16 | Ht 65.0 in | Wt 212.0 lb

## 2020-07-24 DIAGNOSIS — R7303 Prediabetes: Secondary | ICD-10-CM | POA: Diagnosis not present

## 2020-07-24 DIAGNOSIS — H9071 Mixed conductive and sensorineural hearing loss, unilateral, right ear, with unrestricted hearing on the contralateral side: Secondary | ICD-10-CM | POA: Diagnosis not present

## 2020-07-24 DIAGNOSIS — I110 Hypertensive heart disease with heart failure: Secondary | ICD-10-CM

## 2020-07-24 DIAGNOSIS — J4540 Moderate persistent asthma, uncomplicated: Secondary | ICD-10-CM | POA: Diagnosis not present

## 2020-07-24 DIAGNOSIS — G35 Multiple sclerosis: Secondary | ICD-10-CM

## 2020-07-24 DIAGNOSIS — I43 Cardiomyopathy in diseases classified elsewhere: Secondary | ICD-10-CM

## 2020-07-24 DIAGNOSIS — F32A Depression, unspecified: Secondary | ICD-10-CM

## 2020-07-24 MED ORDER — ATORVASTATIN CALCIUM 20 MG PO TABS: 20.0000 mg | ORAL_TABLET | Freq: Every day | ORAL | 0 refills | Status: DC

## 2020-07-24 NOTE — Progress Notes (Addendum)
New Patient Office Visit  Subjective:  Patient ID: Renee Brady, female    DOB: 10-28-1975  Age: 45 y.o. MRN: [id number]  CC:  Chief Complaint  Patient presents with  . Hypertension  . Hyperlipidemia    HPI Renee Brady presents to establish care.  Patient has hypertension, hyperlipidemia, depression with anxiety, multiple sclerosis, GERD, vitamin D deficiency and fibromyalgia.  Hypertension: Currently on Imdur 30 mg once daily, Toprol-XL 25 mg once daily, 81 mg once daily. Fibromyalgia: Amitriptyline 50 mg once at night, Flexeril 5 mg daily as needed for muscle spasms, hydroxyzine 25 mg 3 times daily.  This is also for anxiety.  Diclofenac 25 mg daily as needed as needed pain. Hyperlipidemia: took crestor for 3 days, but stopped due to abdominal pain.  Multiple sclerosis: Patient is seen by Dr. Epimenio Foot.  Patient has headaches and takes Trileptal as needed for this.  This was given by neurology. Current Outpatient Medications on File Prior to Visit  Medication Sig Dispense Refill  . amitriptyline (ELAVIL) 50 MG tablet Take 50 mg by mouth at bedtime.    Marland Kitchen aspirin EC 81 MG tablet Take 1 tablet (81 mg total) by mouth daily. Swallow whole. 90 tablet 3  . cyclobenzaprine (FLEXERIL) 5 MG tablet Take 5 mg by mouth as needed for muscle spasms.    . diclofenac (VOLTAREN) 25 MG EC tablet Take 25 mg by mouth daily as needed for mild pain.    . hydrOXYzine (ATARAX/VISTARIL) 25 MG tablet Take 25 mg by mouth 3 (three) times daily as needed for itching.    . isosorbide mononitrate (IMDUR) 30 MG 24 hr tablet Take 1 tablet (30 mg total) by mouth daily. 90 tablet 3  . Melatonin 10 MG CAPS Take 10 mg by mouth at bedtime.    . metoprolol succinate (TOPROL XL) 25 MG 24 hr tablet Take 1 tablet (25 mg total) by mouth daily. 30 tablet 6  . montelukast (SINGULAIR) 10 MG tablet Take 10 mg by mouth at bedtime.    Marland Kitchen NIFEdipine (ADALAT CC) 60 MG 24 hr tablet Take 60 mg by mouth daily.    . nitroGLYCERIN  (NITROSTAT) 0.4 MG SL tablet Place 0.4 mg under the tongue every 5 (five) minutes as needed for chest pain.    Marland Kitchen OXcarbazepine (TRILEPTAL) 150 MG tablet Take 150 mg by mouth as needed (headaches).     No current facility-administered medications on file prior to visit.   Past Medical History:  Diagnosis Date  . ANA positive 06/26/2014   Formatting of this note might be different from the original. Overview:  Evaluated by Rheumatology Dr. Herma Carson - dx fibromyalgia given.  . Angina pectoris (HCC) 05/25/2020  . Ankle pain, left 08/27/2014   Formatting of this note might be different from the original. Overview:  Fell on this day with injury to Lt foot and ankle.  . Anxiety and depression 04/03/2015  . Asthma   . Cervical radiculitis 10/23/2016  . Chronic pain of left knee 09/18/2014   Formatting of this note might be different from the original. Overview:  Evaluated by Ortho.  . Dysesthesia 01/26/2019  . Epidural fibrosis 06/15/2019  . Essential hypertension   . Fibromyalgia muscle pain   . GERD (gastroesophageal reflux disease)   . Herniated nucleus pulposus, cervical 10/23/2016  . High risk medication use 01/29/2018  . Iron deficiency anemia   . Lumbar radiculopathy 01/26/2019  . Mixed dyslipidemia 05/25/2020  . 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 Numbness and tingling 04/11/2014  . Obesity (BMI 35.0-39.9 without comorbidity) 05/25/2020  . Other headache syndrome 01/29/2018  . Pes anserinus bursitis of right knee 07/24/2014   Formatting of this note might be different from the original. PT, Life-style modification, and NSAID PRN.  Marland Kitchen Raynaud's disease   . Spinal stenosis in cervical region 10/23/2016  . Trochanteric bursitis of right hip 07/13/2014   Formatting of this note might be different from the original. Evaluated by Ortho.  Tx with CSI and PT.  Marland Kitchen Urinary urgency 01/29/2018  . Vision abnormalities   . Vitamin D  deficiency 07/28/2014   Formatting of this note might be different from the original. Daily supplement.    Past Surgical History:  Procedure Laterality Date  . ABDOMINAL HYSTERECTOMY    . BACK SURGERY  2018   lumbar laminectomy/Microdiskectomy   . CESAREAN SECTION  x 2  . LEFT HEART CATH AND CORONARY ANGIOGRAPHY N/A 06/08/2020   Procedure: LEFT HEART CATH AND CORONARY ANGIOGRAPHY;  Surgeon: Kathleene Hazel, MD;  Location: [facility];  Service: Cardiovascular;  Laterality: N/A;  . TONSILLECTOMY    . WISDOM TOOTH EXTRACTION      Family History  Problem Relation Age of Onset  . Stroke Mother   . Cardiomyopathy Mother   . Hypertension Mother   . Diabetes Mother   . Hypertension Father   . ADD / ADHD Father   . Depression Father   . Diabetes Brother     Social History   Socioeconomic History  . Marital status: Married    Spouse name: Not on file  . Number of children: Not on file  . Years of education: Not on file  . Highest education level: Not on file  Occupational History  . Not on file  Tobacco Use  . Smoking status: Former Smoker    Quit date: 2005    Years since quitting: 17.3  . Smokeless tobacco: Never Used  Substance and Sexual Activity  . Alcohol use: No  . Drug use: Not Currently    Types: Marijuana  . Sexual activity: Yes    Birth control/protection: Surgical  Other Topics Concern  . Not on file  Social History Narrative  . Not on file   Social Determinants of Health   Financial Resource Strain: Not on file  Food Insecurity: Not on file  Transportation Needs: Not on file  Physical Activity: Not on file  Stress: Not on file  Social Connections: Not on file  Intimate Partner Violence: Not on file    ROS Review of Systems  Constitutional: Negative for chills, fatigue and fever.  HENT: Positive for hearing loss. Negative for congestion, ear pain and sore throat.   Respiratory: Negative for cough and shortness of breath.    Cardiovascular: Negative for chest pain and palpitations.  Gastrointestinal: Negative for abdominal pain, constipation, diarrhea, nausea and vomiting.  Endocrine: Negative for polydipsia, polyphagia and polyuria.  Genitourinary: Negative for difficulty urinating and dysuria.  Musculoskeletal: Negative for arthralgias, back pain and myalgias.  Skin: Negative for rash.  Neurological: Negative for headaches.  Psychiatric/Behavioral: Negative for dysphoric mood. The patient is not nervous/anxious.     Objective:   Today's Vitals: BP 140/76   Pulse 76   Temp 97.7 F (36.5 C)   Resp 16   Ht 5\' 5"  (1.651 m)   Wt 212 lb (96.2 kg)   BMI 35.28 kg/m   Physical  Exam Vitals reviewed.  Constitutional:      Appearance: Normal appearance.  Neck:     Vascular: No carotid bruit.  Cardiovascular:     Rate and Rhythm: Normal rate and regular rhythm.     Pulses: Normal pulses.     Heart sounds: Normal heart sounds.  Pulmonary:     Effort: Pulmonary effort is normal.     Breath sounds: Normal breath sounds.  Abdominal:     General: Bowel sounds are normal.     Palpations: Abdomen is soft.     Tenderness: There is no abdominal tenderness.  Neurological:     Mental Status: She is alert and oriented to person, place, and time.  Psychiatric:        Mood and Affect: Mood normal.        Behavior: Behavior normal.     Assessment & Plan:   1. Mixed conductive and sensorineural hearing loss of right ear, unspecified hearing status on contralateral side Ringing in ear (tinnitus) - trial on otc bioflavanoids. Hearing loss: refer to ear, nose, and throat specialist, Dr. Marcheta Grammes.  - Ambulatory referral to ENT  2. Moderate persistent asthma without complication Continue singulair and duoneb treatments prn.  3. Multiple sclerosis (HCC) Mgmt by Dr. Epimenio Foot.  4. bmi 35, morbid obesity with serious comorbidities Recommend continue to work on eating healthy diet and exercise.  5.  Prediabetes -Recommend continue to work on eating healthy diet and exercise.  6. Cardiomyopathy due to hypertension, with heart failure (HCC) - Keep follow up with cardiology.  Clayborn Bigness I Leal-Borjas,acting as a scribe for Blane Ohara, MD.,have documented all relevant documentation on the behalf of Blane Ohara, MD,as directed by  Blane Ohara, MD while in the presence of Blane Ohara, MD.   Outpatient Encounter Medications as of 07/24/2020  Medication Sig  . amitriptyline (ELAVIL) 50 MG tablet Take 50 mg by mouth at bedtime.  Marland Kitchen aspirin EC 81 MG tablet Take 1 tablet (81 mg total) by mouth daily. Swallow whole.  Marland Kitchen atorvastatin (LIPITOR) 20 MG tablet Take 1 tablet (20 mg total) by mouth daily.  . cyclobenzaprine (FLEXERIL) 5 MG tablet Take 5 mg by mouth as needed for muscle spasms.  . diclofenac (VOLTAREN) 25 MG EC tablet Take 25 mg by mouth daily as needed for mild pain.  . hydrOXYzine (ATARAX/VISTARIL) 25 MG tablet Take 25 mg by mouth 3 (three) times daily as needed for itching.  . isosorbide mononitrate (IMDUR) 30 MG 24 hr tablet Take 1 tablet (30 mg total) by mouth daily.  . Melatonin 10 MG CAPS Take 10 mg by mouth at bedtime.  . metoprolol succinate (TOPROL XL) 25 MG 24 hr tablet Take 1 tablet (25 mg total) by mouth daily.  . montelukast (SINGULAIR) 10 MG tablet Take 10 mg by mouth at bedtime.  Marland Kitchen NIFEdipine (ADALAT CC) 60 MG 24 hr tablet Take 60 mg by mouth daily.  . nitroGLYCERIN (NITROSTAT) 0.4 MG SL tablet Place 0.4 mg under the tongue every 5 (five) minutes as needed for chest pain.  Marland Kitchen OXcarbazepine (TRILEPTAL) 150 MG tablet Take 150 mg by mouth as needed (headaches).  . [DISCONTINUED] albuterol (VENTOLIN HFA) 108 (90 Base) MCG/ACT inhaler Inhale 2 puffs into the lungs as needed for wheezing or shortness of breath.  . [DISCONTINUED] rosuvastatin (CRESTOR) 10 MG tablet Take 1 tablet (10 mg total) by mouth daily.   No facility-administered encounter medications on file as of 07/24/2020.     Follow-up: No follow-ups on file.  Eugenie Norrie, CMA

## 2020-07-24 NOTE — Patient Instructions (Signed)
Ringing in ear (tinnitus) - trial on otc bioflavanoids. Hearing loss: refer to ear, nose, and throat specialist, Dr. Marcheta Grammes.  Keep follow up with cardiology.    High Cholesterol  High cholesterol is a condition in which the blood has high levels of a white, waxy substance similar to fat (cholesterol). The liver makes all the cholesterol that the body needs. The human body needs small amounts of cholesterol to help build cells. A person gets extra or excess cholesterol from the food that he or she eats. The blood carries cholesterol from the liver to the rest of the body. If you have high cholesterol, deposits (plaques) may build up on the walls of your arteries. Arteries are the blood vessels that carry blood away from your heart. These plaques make the arteries narrow and stiff. Cholesterol plaques increase your risk for heart attack and stroke. Work with your health care provider to keep your cholesterol levels in a healthy range. What increases the risk? The following factors may make you more likely to develop this condition:  Eating foods that are high in animal fat (saturated fat) or cholesterol.  Being overweight.  Not getting enough exercise.  A family history of high cholesterol (familial hypercholesterolemia).  Use of tobacco products.  Having diabetes. What are the signs or symptoms? There are no symptoms of this condition. How is this diagnosed? This condition may be diagnosed based on the results of a blood test.  If you are older than 45 years of age, your health care provider may check your cholesterol levels every 4-6 years.  You may be checked more often if you have high cholesterol or other risk factors for heart disease. The blood test for cholesterol measures:  "Bad" cholesterol, or LDL cholesterol. This is the main type of cholesterol that causes heart disease. The desired level is less than 70 mg/dl with KNOWN heart disease. Marland Kitchen  "Good" cholesterol, or HDL  cholesterol. HDL helps protect against heart disease by cleaning the arteries and carrying the LDL to the liver for processing. The desired level for HDL is 60 mg/dL or higher.  Triglycerides. These are fats that your body can store or burn for energy. The desired level is less than 150 mg/dL.  Total cholesterol. This measures the total amount of cholesterol in your blood and includes LDL, HDL, and triglycerides. The desired level is less than 200 mg/dL. How is this treated? This condition may be treated with:  Diet changes. You may be asked to eat foods that have more fiber and less saturated fats or added sugar.  Lifestyle changes. These may include regular exercise, maintaining a healthy weight, and quitting use of tobacco products.  Medicines. These are given when diet and lifestyle changes have not worked. You may be prescribed a statin medicine to help lower your cholesterol levels. Follow these instructions at home: Eating and drinking  Eat a healthy, balanced diet. This diet includes: ? Daily servings of a variety of fresh, frozen, or canned fruits and vegetables. ? Daily servings of whole grain foods that are rich in fiber. ? Foods that are low in saturated fats and trans fats. These include poultry and fish without skin, lean cuts of meat, and low-fat dairy products. ? A variety of fish, especially oily fish that contain omega-3 fatty acids. Aim to eat fish at least 2 times a week.  Avoid foods and drinks that have added sugar.  Use healthy cooking methods, such as roasting, grilling, broiling, baking, poaching, steaming, and  stir-frying. Do not fry your food except for stir-frying.   Lifestyle  Get regular exercise. Aim to exercise for a total of 150 minutes a week. Increase your activity level by doing activities such as gardening, walking, and taking the stairs.  Do not use any products that contain nicotine or tobacco, such as cigarettes, e-cigarettes, and chewing tobacco.  If you need help quitting, ask your health care provider.   General instructions  Take over-the-counter and prescription medicines only as told by your health care provider.  Keep all follow-up visits as told by your health care provider. This is important. Where to find more information  American Heart Association: www.heart.org  National Heart, Lung, and Blood Institute: [employer].is Contact a health care provider if:  You have trouble achieving or maintaining a healthy diet or weight.  You are starting an exercise program.  You are unable to stop smoking. Get help right away if:  You have chest pain.  You have trouble breathing.  You have any symptoms of a stroke. "BE FAST" is an easy way to remember the main warning signs of a stroke: ? B - Balance. Signs are dizziness, sudden trouble walking, or loss of balance. ? E - Eyes. Signs are trouble seeing or a sudden change in vision. ? F - Face. Signs are sudden weakness or numbness of the face, or the face or eyelid drooping on one side. ? A - Arms. Signs are weakness or numbness in an arm. This happens suddenly and usually on one side of the body. ? S - Speech. Signs are sudden trouble speaking, slurred speech, or trouble understanding what people say. ? T - Time. Time to call emergency services. Write down what time symptoms started.  You have other signs of a stroke, such as: ? A sudden, severe headache with no known cause. ? Nausea or vomiting. ? Seizure. These symptoms may represent a serious problem that is an emergency. Do not wait to see if the symptoms will go away. Get medical help right away. Call your local emergency services (911 in the [place] Do not drive yourself to the hospital. Summary  Cholesterol plaques increase your risk for heart attack and stroke. Work with your health care provider to keep your cholesterol levels in a healthy range.  Eat a healthy, balanced diet, get regular exercise, and maintain  a healthy weight.  Do not use any products that contain nicotine or tobacco, such as cigarettes, e-cigarettes, and chewing tobacco.  Get help right away if you have any symptoms of a stroke. This information is not intended to replace advice given to you by your health care provider. Make sure you discuss any questions you have with your health care provider. Document Revised: 02/21/2019 Document Reviewed: 02/21/2019 Elsevier Patient Education  2021 [employer].

## 2020-07-30 ENCOUNTER — Other Ambulatory Visit: Payer: Self-pay

## 2020-07-30 ENCOUNTER — Other Ambulatory Visit: Payer: Self-pay | Admitting: Family Medicine

## 2020-07-30 ENCOUNTER — Encounter: Payer: Self-pay | Admitting: Family Medicine

## 2020-07-30 ENCOUNTER — Telehealth: Payer: Self-pay

## 2020-07-30 MED ORDER — IPRATROPIUM-ALBUTEROL 0.5-2.5 (3) MG/3ML IN SOLN: 3.0000 mL | Freq: Four times a day (QID) | RESPIRATORY_TRACT | 0 refills | Status: DC | PRN

## 2020-07-30 MED ORDER — ALBUTEROL SULFATE HFA 108 (90 BASE) MCG/ACT IN AERS: 2.0000 | INHALATION_SPRAY | RESPIRATORY_TRACT | 2 refills | Status: DC | PRN

## 2020-07-30 NOTE — Telephone Encounter (Signed)
Pt is also requesting ipratropium bromide. Sent mychart msg. Also routed.   Lorita Officer, [place] 07/30/20 7:23 AM

## 2020-07-30 NOTE — Telephone Encounter (Signed)
Pt calling requesting ipratropium bromide. States it was last prescribed by her old PCP. Do not see record. States this was spoken of in appointment.   Lorita Officer, [place] 07/30/20 12:01 PM

## 2020-08-04 ENCOUNTER — Encounter: Payer: Self-pay | Admitting: Family Medicine

## 2020-08-09 ENCOUNTER — Encounter: Payer: Self-pay | Admitting: Family Medicine

## 2020-08-17 LAB — LIPID PANEL
Chol/HDL Ratio: 3 ratio (ref 0.0–4.4)
Cholesterol, Total: 130 mg/dL (ref 100–199)
HDL: 43 mg/dL (ref >39)
LDL Chol Calc (NIH): 69 mg/dL (ref 0–99)
Triglycerides: 94 mg/dL (ref 0–149)
VLDL Cholesterol Cal: 18 mg/dL (ref 5–40)

## 2020-08-17 LAB — BASIC METABOLIC PANEL
BUN/Creatinine Ratio: 15 (ref 9–23)
BUN: 16 mg/dL (ref 6–24)
CO2: 23 mmol/L (ref 20–29)
Calcium: 9.4 mg/dL (ref 8.7–10.2)
Chloride: 100 mmol/L (ref 96–106)
Creatinine, Ser: 1.07 mg/dL — ABNORMAL HIGH (ref 0.57–1.00)
Glucose: 118 mg/dL — ABNORMAL HIGH (ref 65–99)
Potassium: 4.3 mmol/L (ref 3.5–5.2)
Sodium: 137 mmol/L (ref 134–144)
eGFR: 65 mL/min/{1.73_m2} (ref >59)

## 2020-08-17 LAB — HEPATIC FUNCTION PANEL
ALT: 16 IU/L (ref 0–32)
AST: 15 IU/L (ref 0–40)
Albumin: 4.1 g/dL (ref 3.8–4.8)
Alkaline Phosphatase: 116 IU/L (ref 44–121)
Bilirubin Total: 0.9 mg/dL (ref 0.0–1.2)
Bilirubin, Direct: 0.18 mg/dL (ref 0.00–0.40)
Total Protein: 6.8 g/dL (ref 6.0–8.5)

## 2020-09-05 ENCOUNTER — Encounter: Payer: Self-pay | Admitting: Family Medicine

## 2020-09-06 ENCOUNTER — Other Ambulatory Visit: Payer: Self-pay

## 2020-09-06 MED ORDER — MONTELUKAST SODIUM 10 MG PO TABS: 10.0000 mg | ORAL_TABLET | Freq: Every day | ORAL | 0 refills | Status: DC

## 2020-09-10 ENCOUNTER — Other Ambulatory Visit: Payer: Self-pay

## 2020-09-10 MED ORDER — AMITRIPTYLINE HCL 50 MG PO TABS: 50.0000 mg | ORAL_TABLET | Freq: Every day | ORAL | 0 refills | Status: DC

## 2020-10-03 ENCOUNTER — Other Ambulatory Visit: Payer: Self-pay | Admitting: Family Medicine

## 2020-10-03 ENCOUNTER — Other Ambulatory Visit: Payer: Self-pay

## 2020-10-03 ENCOUNTER — Encounter: Payer: Self-pay | Admitting: Family Medicine

## 2020-10-03 ENCOUNTER — Ambulatory Visit (INDEPENDENT_AMBULATORY_CARE_PROVIDER_SITE_OTHER): Payer: Medicare Other | Admitting: Family Medicine

## 2020-10-03 VITALS — BP 130/76 | HR 84 | Temp 97.5°F | Resp 18 | Ht 65.0 in | Wt 211.0 lb

## 2020-10-03 DIAGNOSIS — M545 Low back pain, unspecified: Secondary | ICD-10-CM | POA: Diagnosis not present

## 2020-10-03 DIAGNOSIS — M797 Fibromyalgia: Secondary | ICD-10-CM

## 2020-10-03 DIAGNOSIS — M542 Cervicalgia: Secondary | ICD-10-CM | POA: Diagnosis not present

## 2020-10-03 DIAGNOSIS — G35 Multiple sclerosis: Secondary | ICD-10-CM | POA: Diagnosis not present

## 2020-10-03 DIAGNOSIS — F4310 Post-traumatic stress disorder, unspecified: Secondary | ICD-10-CM

## 2020-10-03 MED ORDER — BUSPIRONE HCL 5 MG PO TABS: 5.0000 mg | ORAL_TABLET | Freq: Three times a day (TID) | ORAL | 0 refills | Status: DC

## 2020-10-03 MED ORDER — DICLOFENAC EPOLAMINE 1.3 % EX PTCH: 1.0000 | MEDICATED_PATCH | Freq: Two times a day (BID) | CUTANEOUS | 0 refills | Status: DC

## 2020-10-03 NOTE — Patient Instructions (Addendum)
Start on flector patch (replacing diclofenac.) Start on buspirone for anxiety. Take as directed.   Neck Exercises Ask your health care provider which exercises are safe for you. Do exercises exactly as told by your health care provider and adjust them as directed. It is normal to feel mild stretching, pulling, tightness, or discomfort as you do these exercises. Stop right away if you feel sudden pain or your pain gets worse. Do not begin these exercises until told by your health care provider. Neck exercises can be important for many reasons. They can improve strength and maintain flexibility in your neck, which will help your upper back and preventneck pain. Stretching exercises Rotation neck stretching  Sit in a chair or stand up. Place your feet flat on the floor, shoulder width apart. Slowly turn your head (rotate) to the right until a slight stretch is felt. Turn it all the way to the right so you can look over your right shoulder. Do not tilt or tip your head. Hold this position for 10-30 seconds. Slowly turn your head (rotate) to the left until a slight stretch is felt. Turn it all the way to the left so you can look over your left shoulder. Do not tilt or tip your head. Hold this position for 10-30 seconds. Repeat __________ times. Complete this exercise __________ times a day. Neck retraction Sit in a sturdy chair or stand up. Look straight ahead. Do not bend your neck. Use your fingers to push your chin backward (retraction). Do not bend your neck for this movement. Continue to face straight ahead. If you are doing the exercise properly, you will feel a slight sensation in your throat and a stretch at the back of your neck. Hold the stretch for 1-2 seconds. Repeat __________ times. Complete this exercise __________ times a day. Strengthening exercises Neck press Lie on your back on a firm bed or on the floor with a pillow under your head. Use your neck muscles to push your head  down on the pillow and straighten your spine. Hold the position as well as you can. Keep your head facing up (in a neutral position) and your chin tucked. Slowly count to 5 while holding this position. Repeat __________ times. Complete this exercise __________ times a day. Isometrics These are exercises in which you strengthen the muscles in your neck while keeping your neck still (isometrics). Sit in a supportive chair and place your hand on your forehead. Keep your head and face facing straight ahead. Do not flex or extend your neck while doing isometrics. Push forward with your head and neck while pushing back with your hand. Hold for 10 seconds. Do the sequence again, this time putting your hand against the back of your head. Use your head and neck to push backward against the hand pressure. Finally, do the same exercise on either side of your head, pushing sideways against the pressure of your hand. Repeat __________ times. Complete this exercise __________ times a day. Prone head lifts Lie face-down (prone position), resting on your elbows so that your chest and upper back are raised. Start with your head facing downward, near your chest. Position your chin either on or near your chest. Slowly lift your head upward. Lift until you are looking straight ahead. Then continue lifting your head as far back as you can comfortably stretch. Hold your head up for 5 seconds. Then slowly lower it to your starting position. Repeat __________ times. Complete this exercise __________ times a day.  Supine head lifts Lie on your back (supine position), bending your knees to point to the ceiling and keeping your feet flat on the floor. Lift your head slowly off the floor, raising your chin toward your chest. Hold for 5 seconds. Repeat __________ times. Complete this exercise __________ times a day. Scapular retraction Stand with your arms at your sides. Look straight ahead. Slowly pull both shoulders  (scapulae) backward and downward (retraction) until you feel a stretch between your shoulder blades in your upper back. Hold for 10-30 seconds. Relax and repeat. Repeat __________ times. Complete this exercise __________ times a day. Contact a health care provider if: Your neck pain or discomfort gets much worse when you do an exercise. Your neck pain or discomfort does not improve within 2 hours after you exercise. If you have any of these problems, stop exercising right away. Do not do the exercises again unless your health care provider says that you can. Get help right away if: You develop sudden, severe neck pain. If this happens, stop exercising right away. Do not do the exercises again unless your health care provider says that you can. This information is not intended to replace advice given to you by your health care provider. Make sure you discuss any questions you have with your healthcare provider. Document Revised: 01/20/2018 Document Reviewed: 01/20/2018 Elsevier Patient Education  2022 [employer] Low Back Sprain or Strain Rehab Ask your health care provider which exercises are safe for you. Do exercises exactly as told by your health care provider and adjust them as directed. It is normal to feel mild stretching, pulling, tightness, or discomfort as you do these exercises. Stop right away if you feel sudden pain or your pain gets worse. Do not begin these exercises until told by your health care provider. Stretching and range-of-motion exercises These exercises warm up your muscles and joints and improve the movement and flexibility of your back. These exercises also help to relieve pain, numbness,and tingling. Lumbar rotation  Lie on your back on a firm surface and bend your knees. Straighten your arms out to your sides so each arm forms a 90-degree angle (right angle) with a side of your body. Slowly move (rotate) both of your knees to one side of your body until you feel a  stretch in your lower back (lumbar). Try not to let your shoulders lift off the floor. Hold this position for __________ seconds. Tense your abdominal muscles and slowly move your knees back to the starting position. Repeat this exercise on the other side of your body. Repeat __________ times. Complete this exercise __________ times a day. Single knee to chest  Lie on your back on a firm surface with both legs straight. Bend one of your knees. Use your hands to move your knee up toward your chest until you feel a gentle stretch in your lower back and buttock. Hold your leg in this position by holding on to the front of your knee. Keep your other leg as straight as possible. Hold this position for __________ seconds. Slowly return to the starting position. Repeat with your other leg. Repeat __________ times. Complete this exercise __________ times a day. Prone extension on elbows  Lie on your abdomen on a firm surface (prone position). Prop yourself up on your elbows. Use your arms to help lift your chest up until you feel a gentle stretch in your abdomen and your lower back. This will place some of your body weight on your elbows.  If this is uncomfortable, try stacking pillows under your chest. Your hips should stay down, against the surface that you are lying on. Keep your hip and back muscles relaxed. Hold this position for __________ seconds. Slowly relax your upper body and return to the starting position. Repeat __________ times. Complete this exercise __________ times a day. Strengthening exercises These exercises build strength and endurance in your back. Endurance is theability to use your muscles for a long time, even after they get tired. Pelvic tilt This exercise strengthens the muscles that lie deep in the abdomen. Lie on your back on a firm surface. Bend your knees and keep your feet flat on the floor. Tense your abdominal muscles. Tip your pelvis up toward the ceiling and  flatten your lower back into the floor. To help with this exercise, you may place a small towel under your lower back and try to push your back into the towel. Hold this position for __________ seconds. Let your muscles relax completely before you repeat this exercise. Repeat __________ times. Complete this exercise __________ times a day. Alternating arm and leg raises  Get on your hands and knees on a firm surface. If you are on a hard floor, you may want to use padding, such as an exercise mat, to cushion your knees. Line up your arms and legs. Your hands should be directly below your shoulders, and your knees should be directly below your hips. Lift your left leg behind you. At the same time, raise your right arm and straighten it in front of you. Do not lift your leg higher than your hip. Do not lift your arm higher than your shoulder. Keep your abdominal and back muscles tight. Keep your hips facing the ground. Do not arch your back. Keep your balance carefully, and do not hold your breath. Hold this position for __________ seconds. Slowly return to the starting position. Repeat with your right leg and your left arm. Repeat __________ times. Complete this exercise __________ times a day. Abdominal set with straight leg raise  Lie on your back on a firm surface. Bend one of your knees and keep your other leg straight. Tense your abdominal muscles and lift your straight leg up, 4-6 inches (10-15 cm) off the ground. Keep your abdominal muscles tight and hold this position for __________ seconds. Do not hold your breath. Do not arch your back. Keep it flat against the ground. Keep your abdominal muscles tense as you slowly lower your leg back to the starting position. Repeat with your other leg. Repeat __________ times. Complete this exercise __________ times a day. Single leg lower with bent knees Lie on your back on a firm surface. Tense your abdominal muscles and lift your feet  off the floor, one foot at a time, so your knees and hips are bent in 90-degree angles (right angles). Your knees should be over your hips and your lower legs should be parallel to the floor. Keeping your abdominal muscles tense and your knee bent, slowly lower one of your legs so your toe touches the ground. Lift your leg back up to return to the starting position. Do not hold your breath. Do not let your back arch. Keep your back flat against the ground. Repeat with your other leg. Repeat __________ times. Complete this exercise __________ times a day. Posture and body mechanics Good posture and healthy body mechanics can help to relieve stress in your body's tissues and joints. Body mechanics refers to the movements and  positions of your body while you do your daily activities. Posture is part of body mechanics. Good posture means: Your spine is in its natural S-curve position (neutral). Your shoulders are pulled back slightly. Your head is not tipped forward. Follow these guidelines to improve your posture and body mechanics in youreveryday activities. Standing  When standing, keep your spine neutral and your feet about hip width apart. Keep a slight bend in your knees. Your ears, shoulders, and hips should line up. When you do a task in which you stand in one place for a long time, place one foot up on a stable object that is 2-4 inches (5-10 cm) high, such as a footstool. This helps keep your spine neutral.  Sitting  When sitting, keep your spine neutral and keep your feet flat on the floor. Use a footrest, if necessary, and keep your thighs parallel to the floor. Avoid rounding your shoulders, and avoid tilting your head forward. When working at a desk or a computer, keep your desk at a height where your hands are slightly lower than your elbows. Slide your chair under your desk so you are close enough to maintain good posture. When working at a computer, place your monitor at a height  where you are looking straight ahead and you do not have to tilt your head forward or downward to look at the screen.  Resting When lying down and resting, avoid positions that are most painful for you. If you have pain with activities such as sitting, bending, stooping, or squatting, lie in a position in which your body does not bend very much. For example, avoid curling up on your side with your arms and knees near your chest (fetal position). If you have pain with activities such as standing for a long time or reaching with your arms, lie with your spine in a neutral position and bend your knees slightly. Try the following positions: Lying on your side with a pillow between your knees. Lying on your back with a pillow under your knees. Lifting  When lifting objects, keep your feet at least shoulder width apart and tighten your abdominal muscles. Bend your knees and hips and keep your spine neutral. It is important to lift using the strength of your legs, not your back. Do not lock your knees straight out. Always ask for help to lift heavy or awkward objects.  This information is not intended to replace advice given to you by your health care provider. Make sure you discuss any questions you have with your healthcare provider. Document Revised: 07/16/2018 Document Reviewed: 04/15/2018 Elsevier Patient Education  2022 [employer]

## 2020-10-03 NOTE — Progress Notes (Addendum)
Acute Office Visit  Subjective:    Patient ID: Renee Brady, female    DOB: [birth year], 45 y.o.   MRN: [id number]  Chief Complaint  Patient presents with   Back Pain    HPI Patient is in today complaining of lumbar back pain and neck pain. Pt is currently on diclofenac 25 mg once daily. Pt is unable to take a higher dose because it causes her to have chest pain, as does flexeril. Ibuprofen and Aleve did not help.  Patient has chronic pain issues: fibromyalgia, lumbar back pain, and left knee pain.  Pt suffers from Multiple sclerosis, PTSD. She is severely disabled.   Past Medical History:  Diagnosis Date   ANA positive 06/26/2014   Formatting of this note might be different from the original. Overview:  Evaluated by Rheumatology Dr. Herma Carson - dx fibromyalgia given.   Angina pectoris (HCC) 05/25/2020   Ankle pain, left 08/27/2014   Formatting of this note might be different from the original. Overview:  Fell on this day with injury to Lt foot and ankle.   Anxiety and depression 04/03/2015   Asthma    Cervical radiculitis 10/23/2016   Chronic pain of left knee 09/18/2014   Formatting of this note might be different from the original. Overview:  Evaluated by Ortho.   Dysesthesia 01/26/2019   Epidural fibrosis 06/15/2019   Essential hypertension    Fibromyalgia muscle pain    GERD (gastroesophageal reflux disease)    Herniated nucleus pulposus, cervical 10/23/2016   High risk medication use 01/29/2018   Iron deficiency anemia    Lumbar radiculopathy 01/26/2019   Mixed dyslipidemia 05/25/2020   Multiple sclerosis (HCC) 12/22/2014   Overview:  JC virus antibody positive with index 2.67- checked 12/2014   Numbness and tingling 04/11/2014   Obesity (BMI 35.0-39.9 without comorbidity) 05/25/2020   Other headache syndrome 01/29/2018   Pes anserinus bursitis of right knee 07/24/2014   Formatting of this note might be different from the original. PT, Life-style modification, and NSAID PRN.    Raynaud's disease    Spinal stenosis in cervical region 10/23/2016   Trochanteric bursitis of right hip 07/13/2014   Formatting of this note might be different from the original. Evaluated by Ortho.  Tx with CSI and PT.   Urinary urgency 01/29/2018   Vision abnormalities    Vitamin D deficiency 07/28/2014   Formatting of this note might be different from the original. Daily supplement.    Past Surgical History:  Procedure Laterality Date   ABDOMINAL HYSTERECTOMY     BACK SURGERY  2018   lumbar laminectomy/Microdiskectomy    CESAREAN SECTION  x 2   LEFT HEART CATH AND CORONARY ANGIOGRAPHY N/A 06/08/2020   Procedure: LEFT HEART CATH AND CORONARY ANGIOGRAPHY;  Surgeon: Kathleene Hazel, MD;  Location: [facility];  Service: Cardiovascular;  Laterality: N/A;   lumbar laminectomy Left 10/03/2016   TONSILLECTOMY     WISDOM TOOTH EXTRACTION      Family History  Problem Relation Age of Onset   Stroke Mother    Cardiomyopathy Mother    Hypertension Mother    Diabetes Mother    Hypertension Father    ADD / ADHD Father    Depression Father    Diabetes Brother     Social History   Socioeconomic History   Marital status: Married    Spouse name: Not on file   Number of children: Not on file   Years of education: Not on file  Highest education level: Not on file  Occupational History   Not on file  Tobacco Use   Smoking status: Former    Pack years: 0.00    Types: Cigarettes    Quit date: 2005    Years since quitting: 17.5   Smokeless tobacco: Never  Substance and Sexual Activity   Alcohol use: No   Drug use: Not Currently    Types: Marijuana   Sexual activity: Yes    Birth control/protection: Surgical  Other Topics Concern   Not on file  Social History Narrative   Not on file   Social Determinants of Health   Financial Resource Strain: Not on file  Food Insecurity: Not on file  Transportation Needs: Not on file  Physical Activity: Not on file  Stress:  Not on file  Social Connections: Not on file  Intimate Partner Violence: Not on file    Outpatient Medications Prior to Visit  Medication Sig Dispense Refill   albuterol (VENTOLIN HFA) 108 (90 Base) MCG/ACT inhaler Inhale 2 puffs into the lungs as needed for wheezing or shortness of breath. 1 each 2   amitriptyline (ELAVIL) 50 MG tablet Take 1 tablet (50 mg total) by mouth at bedtime. 90 tablet 0   aspirin EC 81 MG tablet Take 1 tablet (81 mg total) by mouth daily. Swallow whole. 90 tablet 3   atorvastatin (LIPITOR) 20 MG tablet Take 1 tablet (20 mg total) by mouth daily. 90 tablet 0   cyclobenzaprine (FLEXERIL) 5 MG tablet Take 5 mg by mouth as needed for muscle spasms.     diclofenac (VOLTAREN) 25 MG EC tablet Take 25 mg by mouth daily as needed for mild pain.     hydrOXYzine (ATARAX/VISTARIL) 25 MG tablet Take 25 mg by mouth 3 (three) times daily as needed for itching.     ipratropium-albuterol (DUONEB) 0.5-2.5 (3) MG/3ML SOLN Take 3 mLs by nebulization every 6 (six) hours as needed (wheezing). 360 mL 0   isosorbide mononitrate (IMDUR) 30 MG 24 hr tablet Take 1 tablet (30 mg total) by mouth daily. 90 tablet 3   Melatonin 10 MG CAPS Take 10 mg by mouth at bedtime.     metoprolol succinate (TOPROL XL) 25 MG 24 hr tablet Take 1 tablet (25 mg total) by mouth daily. 30 tablet 6   montelukast (SINGULAIR) 10 MG tablet Take 1 tablet (10 mg total) by mouth at bedtime. 90 tablet 0   NIFEdipine (ADALAT CC) 60 MG 24 hr tablet Take 60 mg by mouth daily.     nitroGLYCERIN (NITROSTAT) 0.4 MG SL tablet Place 0.4 mg under the tongue every 5 (five) minutes as needed for chest pain.     OXcarbazepine (TRILEPTAL) 150 MG tablet Take 150 mg by mouth as needed (headaches).     No facility-administered medications prior to visit.    Allergies  Allergen Reactions   Gabapentin Other (See Comments)    Change in Mental attitude   Latex Itching    Power   Meloxicam Nausea Only   Oxycodone Hcl Itching and  Nausea Only   Percocet [Oxycodone-Acetaminophen] Nausea Only   Prozac [Fluoxetine Hcl] Other (See Comments)    Mental Changes   Tecfidera [Dimethyl Fumarate] Diarrhea    Body shut down   Tramadol Itching   Zoloft [Sertraline] Other (See Comments)    Caused Heart Porblems   Hydrochlorothiazide Palpitations   Penicillins Itching and Rash    Reaction: In her 20's    Review of Systems  Constitutional:  Negative for chills, fatigue and fever.  HENT:  Positive for congestion and rhinorrhea. Negative for sore throat.   Respiratory:  Positive for cough. Negative for shortness of breath.   Cardiovascular:  Negative for chest pain.  Gastrointestinal:  Negative for abdominal pain, constipation, diarrhea, nausea and vomiting.  Genitourinary:  Negative for dysuria and urgency.  Musculoskeletal:  Positive for arthralgias (left hip pain) and back pain. Negative for myalgias.  Neurological:  Negative for dizziness, weakness, light-headedness and headaches.  Psychiatric/Behavioral:  Negative for dysphoric mood. The patient is not nervous/anxious.       Objective:    Physical Exam Vitals reviewed.  Constitutional:      Appearance: Normal appearance. She is normal weight.  Neck:     Vascular: No carotid bruit.  Cardiovascular:     Rate and Rhythm: Normal rate and regular rhythm.     Pulses: Normal pulses.     Heart sounds: Normal heart sounds.  Pulmonary:     Effort: Pulmonary effort is normal. No respiratory distress.     Breath sounds: Normal breath sounds.  Abdominal:     General: Abdomen is flat. Bowel sounds are normal. There is no distension.     Palpations: Abdomen is soft.     Tenderness: There is no abdominal tenderness. There is no rebound.  Musculoskeletal:        General: Tenderness (FM trigger points. BL cervical paraspinal muscles. Lumbar BL paraspinal muscles.) present.  Neurological:     Mental Status: She is alert and oriented to person, place, and time.  Psychiatric:         Mood and Affect: Mood normal.        Behavior: Behavior normal.   BP 130/76   Pulse 84   Temp (!) 97.5 F (36.4 C)   Resp 18   Ht 5\' 5"  (1.651 m)   Wt 211 lb (95.7 kg)   BMI 35.11 kg/m  Wt Readings from Last 3 Encounters:  10/03/20 211 lb (95.7 kg)  07/24/20 212 lb (96.2 kg)  07/03/20 215 lb 12.8 oz (97.9 kg)    Health Maintenance Due  Topic Date Due   Pneumococcal Vaccine 64-70 Years old (1 - PCV) Never done   TETANUS/TDAP  Never done   COVID-19 Vaccine (3 - Moderna risk series) 09/06/2019   COLONOSCOPY (Pts 45-11yrs Insurance coverage will need to be confirmed)  Never done    There are no preventive care reminders to display for this patient.   No results found for: TSH Lab Results  Component Value Date   WBC 7.1 05/25/2020   HGB 12.5 05/25/2020   HCT 38.5 05/25/2020   MCV 74 (L) 05/25/2020   PLT 386 05/25/2020   Lab Results  Component Value Date   NA 137 08/17/2020   K 4.3 08/17/2020   CO2 23 08/17/2020   GLUCOSE 118 (H) 08/17/2020   BUN 16 08/17/2020   CREATININE 1.07 (H) 08/17/2020   BILITOT 0.9 08/17/2020   ALKPHOS 116 08/17/2020   AST 15 08/17/2020   ALT 16 08/17/2020   PROT 6.8 08/17/2020   ALBUMIN 4.1 08/17/2020   CALCIUM 9.4 08/17/2020   EGFR 65 08/17/2020   Lab Results  Component Value Date   CHOL 130 08/17/2020   Lab Results  Component Value Date   HDL 43 08/17/2020   Lab Results  Component Value Date   LDLCALC 69 08/17/2020   Lab Results  Component Value Date   TRIG 94 08/17/2020   Lab Results  Component Value Date   CHOLHDL 3.0 08/17/2020   No results found for: HGBA1C     Assessment & Plan:  1. Lumbar back pain 2. Neck pain 3. Multiple sclerosis (HCC) 4. Fibromyalgia muscle pain 5. PTSD (post-traumatic stress disorder)  6. GAD  Order flector patches.  Order buspirone 5 mg one three times a day.  Recommend exercises.  Recommended ice/heat.   Meds ordered this encounter  Medications   diclofenac (FLECTOR)  1.3 % PTCH    Sig: Place 1 patch onto the skin 2 (two) times daily.    Dispense:  60 patch    Refill:  0   busPIRone (BUSPAR) 5 MG tablet    Sig: Take 1 tablet (5 mg total) by mouth 3 (three) times daily.    Dispense:  90 tablet    Refill:  0   Follow-up: Return in about 3 months (around 01/03/2021).  An After Visit Summary was printed and given to the patient.  Blane Ohara, MD [facility] [phone]

## 2020-10-13 ENCOUNTER — Encounter: Payer: Self-pay | Admitting: Family Medicine

## 2020-10-14 ENCOUNTER — Other Ambulatory Visit: Payer: Self-pay | Admitting: Family Medicine

## 2020-10-15 ENCOUNTER — Other Ambulatory Visit: Payer: Self-pay | Admitting: Family Medicine

## 2020-10-15 MED ORDER — ATORVASTATIN CALCIUM 20 MG PO TABS: 20.0000 mg | ORAL_TABLET | Freq: Every day | ORAL | 0 refills | Status: DC

## 2020-10-16 ENCOUNTER — Encounter: Payer: Self-pay | Admitting: Family Medicine

## 2020-10-16 ENCOUNTER — Other Ambulatory Visit: Payer: Self-pay

## 2020-10-16 ENCOUNTER — Other Ambulatory Visit: Payer: Self-pay | Admitting: Family Medicine

## 2020-10-16 MED ORDER — METHOCARBAMOL 500 MG PO TABS: 500.0000 mg | ORAL_TABLET | Freq: Four times a day (QID) | ORAL | 3 refills | Status: DC | PRN

## 2020-10-16 MED ORDER — DICLOFENAC SODIUM 25 MG PO TBEC: 25.0000 mg | DELAYED_RELEASE_TABLET | Freq: Every day | ORAL | 0 refills | Status: DC | PRN

## 2020-10-16 NOTE — Addendum Note (Signed)
Addended byBlane Ohara on: 10/16/2020 04:57 PM   Modules accepted: Orders

## 2020-10-17 ENCOUNTER — Encounter: Payer: Self-pay | Admitting: Family Medicine

## 2020-10-21 ENCOUNTER — Other Ambulatory Visit: Payer: Self-pay | Admitting: Family Medicine

## 2020-10-21 ENCOUNTER — Encounter: Payer: Self-pay | Admitting: Family Medicine

## 2020-10-21 MED ORDER — TIZANIDINE HCL 2 MG PO TABS: 2.0000 mg | ORAL_TABLET | Freq: Four times a day (QID) | ORAL | 0 refills | Status: DC | PRN

## 2020-10-25 ENCOUNTER — Other Ambulatory Visit: Payer: Self-pay | Admitting: Family Medicine

## 2020-10-29 ENCOUNTER — Other Ambulatory Visit: Payer: Self-pay | Admitting: Family Medicine

## 2020-11-14 ENCOUNTER — Other Ambulatory Visit: Payer: Self-pay

## 2020-11-15 ENCOUNTER — Telehealth: Payer: Self-pay | Admitting: Cardiology

## 2020-11-15 ENCOUNTER — Ambulatory Visit (INDEPENDENT_AMBULATORY_CARE_PROVIDER_SITE_OTHER): Payer: Medicare Other | Admitting: Cardiology

## 2020-11-15 ENCOUNTER — Other Ambulatory Visit: Payer: Self-pay

## 2020-11-15 ENCOUNTER — Encounter: Payer: Self-pay | Admitting: Cardiology

## 2020-11-15 VITALS — BP 124/70 | HR 86 | Ht 65.0 in | Wt 222.8 lb

## 2020-11-15 DIAGNOSIS — Z79899 Other long term (current) drug therapy: Secondary | ICD-10-CM

## 2020-11-15 DIAGNOSIS — E782 Mixed hyperlipidemia: Secondary | ICD-10-CM | POA: Diagnosis not present

## 2020-11-15 DIAGNOSIS — I10 Essential (primary) hypertension: Secondary | ICD-10-CM | POA: Diagnosis not present

## 2020-11-15 DIAGNOSIS — E559 Vitamin D deficiency, unspecified: Secondary | ICD-10-CM

## 2020-11-15 DIAGNOSIS — I2510 Atherosclerotic heart disease of native coronary artery without angina pectoris: Secondary | ICD-10-CM

## 2020-11-15 MED ORDER — ATORVASTATIN CALCIUM 20 MG PO TABS: 20.0000 mg | ORAL_TABLET | Freq: Every day | ORAL | 3 refills | Status: DC

## 2020-11-15 NOTE — Telephone Encounter (Signed)
Patient needs a refill on Lipitor 20 mg sent to [facility] in [place]

## 2020-11-15 NOTE — Patient Instructions (Signed)
Medication Instructions:  No medication changes. *If you need a refill on your cardiac medications before your next appointment, please call your pharmacy*   Lab Work: Your physician recommends that you have labs done in the office today. Your test included  basic metabolic panel, complete blood count, TSH, hgb A1C, vitamin D, liver function and lipids.  If you have labs (blood work) drawn today and your tests are completely normal, you will receive your results only by: . MyChart Message (if you have MyChart) OR . A paper copy in the mail If you have any lab test that is abnormal or we need to change your treatment, we will call you to review the results.   Testing/Procedures: None ordered   Follow-Up: At [facility], you and your health needs are our priority.  As part of our continuing mission to provide you with exceptional heart care, we have created designated Provider Care Teams.  These Care Teams include your primary Cardiologist (physician) and Advanced Practice Providers (APPs -  Physician Assistants and Nurse Practitioners) who all work together to provide you with the care you need, when you need it.  We recommend signing up for the patient portal called "MyChart".  Sign up information is provided on this After Visit Summary.  MyChart is used to connect with patients for Virtual Visits (Telemedicine).  Patients are able to view lab/test results, encounter notes, upcoming appointments, etc.  Non-urgent messages can be sent to your provider as well.   To learn more about what you can do with MyChart, go to [email or web address].    Your next appointment:   6 month(s)  The format for your next appointment:   In Person  Provider:   Rajan Revankar, MD   Other Instructions NA   

## 2020-11-15 NOTE — Progress Notes (Signed)
Cardiology Office Note:    Date:  11/15/2020   ID:  Renee Brady, DOB 06/24/1975, MRN [id number]  PCP:  Blane Ohara, MD  Cardiologist:  Garwin Brothers, MD   Referring MD: Blane Ohara, MD    ASSESSMENT:    1. Coronary artery disease involving native coronary artery of native heart without angina pectoris   2. Essential hypertension   3. Mixed hyperlipidemia   4. Vitamin D deficiency   5. High risk medication use    PLAN:    In order of problems listed above:  Coronary artery disease: Secondary prevention stressed with the patient.  Importance of compliance with diet medication stressed and she vocalized understanding.  She was advised to walk at least half an hour a day 5 days a week and she promises to do so.  She has issues with her foot so she could alternate and try stationary bicycling.  She agrees. Essential hypertension: Blood pressure stable and diet was emphasized. Mixed dyslipidemia: Diet was emphasized.  She is fasting and will have complete blood work today. Obesity: Risks of obesity explained and weight reduction urged.  Diet emphasized and she promises to do better. Vitamin D deficiency: She takes supplemental vitamin D and we will recheck her level. Patient will be seen in follow-up appointment in 6 months or earlier if the patient has any concerns    Medication Adjustments/Labs and Tests Ordered: Current medicines are reviewed at length with the patient today.  Concerns regarding medicines are outlined above.  Orders Placed This Encounter  Procedures   Basic metabolic panel   CBC with Differential/Platelet   Hepatic function panel   Lipid panel   TSH   VITAMIN D 25 Hydroxy (Vit-D Deficiency, Fractures)   Hemoglobin A1c    No orders of the defined types were placed in this encounter.    No chief complaint on file.    History of Present Illness:    Renee Brady is a 45 y.o. female.  Patient has past medical history of coronary artery disease  as mentioned below, essential hypertension, dyslipidemia and diet-controlled diabetes mellitus.  She denies any problems at this time and takes care of activities of daily living.  No chest pain orthopnea or PND.  At the time of my evaluation, the patient is alert awake oriented and in no distress.  Past Medical History:  Diagnosis Date   ANA positive 06/26/2014   Formatting of this note might be different from the original. Overview:  Evaluated by Rheumatology Dr. Herma Carson - dx fibromyalgia given.   Angina pectoris (HCC) 05/25/2020   Ankle pain, left 08/27/2014   Formatting of this note might be different from the original. Overview:  Fell on this day with injury to Lt foot and ankle.   Anxiety and depression 04/03/2015   Asthma    CAD (coronary artery disease) 07/03/2020   Cervical radiculitis 10/23/2016   Chronic pain of left knee 09/18/2014   Formatting of this note might be different from the original. Overview:  Evaluated by Ortho.   Dysesthesia 01/26/2019   Epidural fibrosis 06/15/2019   Essential hypertension    Fibromyalgia muscle pain    GERD (gastroesophageal reflux disease)    Herniated nucleus pulposus, cervical 10/23/2016   High risk medication use 01/29/2018   Hyperlipidemia    Hypertension    Iron deficiency anemia    Lumbar radiculopathy 01/26/2019   Mixed dyslipidemia 05/25/2020   Multiple sclerosis (HCC) 12/22/2014   Overview:  JC virus antibody positive with  index 2.67- checked 12/2014   Nausea 08/23/2018   Numbness and tingling 04/11/2014   Obesity (BMI 35.0-39.9 without comorbidity) 05/25/2020   Other headache syndrome 01/29/2018   Pes anserinus bursitis of right knee 07/24/2014   Formatting of this note might be different from the original. PT, Life-style modification, and NSAID PRN.   Raynaud's disease    Spinal stenosis in cervical region 10/23/2016   Trochanteric bursitis of right hip 07/13/2014   Formatting of this note might be different from the original. Evaluated by Ortho.   Tx with CSI and PT.   Urinary urgency 01/29/2018   Vision abnormalities    Vitamin D deficiency 07/28/2014   Formatting of this note might be different from the original. Daily supplement.    Past Surgical History:  Procedure Laterality Date   ABDOMINAL HYSTERECTOMY     BACK SURGERY  2018   lumbar laminectomy/Microdiskectomy    CESAREAN SECTION  x 2   LEFT HEART CATH AND CORONARY ANGIOGRAPHY N/A 06/08/2020   Procedure: LEFT HEART CATH AND CORONARY ANGIOGRAPHY;  Surgeon: Kathleene Hazel, MD;  Location: [facility];  Service: Cardiovascular;  Laterality: N/A;   lumbar laminectomy Left 10/03/2016   TONSILLECTOMY     WISDOM TOOTH EXTRACTION      Current Medications: Current Meds  Medication Sig   albuterol (VENTOLIN HFA) 108 (90 Base) MCG/ACT inhaler Inhale 2 puffs into the lungs as needed for wheezing or shortness of breath.   amitriptyline (ELAVIL) 50 MG tablet Take 1 tablet (50 mg total) by mouth at bedtime.   aspirin EC 81 MG tablet Take 1 tablet (81 mg total) by mouth daily. Swallow whole.   atorvastatin (LIPITOR) 20 MG tablet Take 1 tablet (20 mg total) by mouth daily.   cyclobenzaprine (FLEXERIL) 5 MG tablet Take 5 mg by mouth as needed for muscle spasms.   diclofenac (VOLTAREN) 25 MG EC tablet Take 1 tablet (25 mg total) by mouth daily as needed for mild pain.   hydrOXYzine (ATARAX/VISTARIL) 25 MG tablet Take 25 mg by mouth 3 (three) times daily as needed for itching.   ipratropium-albuterol (DUONEB) 0.5-2.5 (3) MG/3ML SOLN TAKE 3 MLS BY NEBULIZATION EVERY 6 (SIX) HOURS AS NEEDED (WHEEZING).   Melatonin 10 MG CAPS Take 10 mg by mouth at bedtime.   methocarbamol (ROBAXIN) 500 MG tablet Take 1,000 mg by mouth 2 (two) times daily.   metoprolol succinate (TOPROL XL) 25 MG 24 hr tablet Take 1 tablet (25 mg total) by mouth daily.   montelukast (SINGULAIR) 10 MG tablet Take 1 tablet (10 mg total) by mouth at bedtime.   NIFEdipine (ADALAT CC) 60 MG 24 hr tablet Take 60 mg  by mouth daily.   nitroGLYCERIN (NITROSTAT) 0.4 MG SL tablet Place 0.4 mg under the tongue every 5 (five) minutes as needed for chest pain.     Allergies:   Gabapentin, Latex, Meloxicam, Oxycodone hcl, Percocet [oxycodone-acetaminophen], Prozac [fluoxetine hcl], Tecfidera [dimethyl fumarate], Tramadol, Zoloft [sertraline], Buspirone hcl, Hydrochlorothiazide, and Penicillins   Social History   Socioeconomic History   Marital status: Married    Spouse name: Not on file   Number of children: Not on file   Years of education: Not on file   Highest education level: Not on file  Occupational History   Not on file  Tobacco Use   Smoking status: Former    Types: Cigarettes    Quit date: 2005    Years since quitting: 17.6   Smokeless tobacco: Never  Substance and Sexual Activity   Alcohol use: No   Drug use: Not Currently    Types: Marijuana   Sexual activity: Yes    Birth control/protection: Surgical  Other Topics Concern   Not on file  Social History Narrative   Not on file   Social Determinants of Health   Financial Resource Strain: Not on file  Food Insecurity: Not on file  Transportation Needs: Not on file  Physical Activity: Not on file  Stress: Not on file  Social Connections: Not on file     Family History: The patient's family history includes ADD / ADHD in her father; Cardiomyopathy in her mother; Depression in her father; Diabetes in her brother and mother; Hypertension in her father and mother; Stroke in her mother.  ROS:   Please see the history of present illness.    All other systems reviewed and are negative.  EKGs/Labs/Other Studies Reviewed:    The following studies were reviewed today: I discussed my findings with the patient in extensive length. LEFT HEART CATH AND CORONARY ANGIOGRAPHY   Conclusion    1st Diag lesion is 80% stenosed. Prox LAD lesion is 30% stenosed. The left ventricular systolic function is normal. LV end diastolic pressure is  normal. The left ventricular ejection fraction is 55-65% by visual estimate. There is no mitral valve regurgitation.   1. No left main disease 2. Mild mid LAD stenosis at the takeoff of the small first diagonal branch. The diagonal branch is small in caliber and has diffuse severe stenosis from the ostium through the mid segment.  3. No disease noted in the large caliber Circumflex artery which supplies a large obtuse marginal branch 4. The RCA is a large dominant vessel with no obstructive disease.    Recommendations: Her major epicardial vessels have no obstructive disease. The small caliber diagonal branch that supplies a small segment of myocardium has severe, diffuse ostial/proximal and mid disease. This vessel is too small for PCI. Medical management of CAD.  I think she would benefit from anti-anginal therapy. We will add Imdur 30 mg daily. If she does not tolerate this, I would consider adding Ranexa.      Recent Labs: 05/25/2020: Hemoglobin 12.5; Platelets 386 08/17/2020: ALT 16; BUN 16; Creatinine, Ser 1.07; Potassium 4.3; Sodium 137  Recent Lipid Panel    Component Value Date/Time   CHOL 130 08/17/2020 0933   TRIG 94 08/17/2020 0933   HDL 43 08/17/2020 0933   CHOLHDL 3.0 08/17/2020 0933   LDLCALC 69 08/17/2020 0933    Physical Exam:    VS:  BP 124/70   Pulse 86   Ht 5\' 5"  (1.651 m)   Wt 222 lb 12.8 oz (101.1 kg)   SpO2 98%   BMI 37.08 kg/m     Wt Readings from Last 3 Encounters:  11/15/20 222 lb 12.8 oz (101.1 kg)  10/03/20 211 lb (95.7 kg)  07/24/20 212 lb (96.2 kg)     GEN: Patient is in no acute distress HEENT: Normal NECK: No JVD; No carotid bruits LYMPHATICS: No lymphadenopathy CARDIAC: Hear sounds regular, 2/6 systolic murmur at the apex. RESPIRATORY:  Clear to auscultation without rales, wheezing or rhonchi  ABDOMEN: Soft, non-tender, non-distended MUSCULOSKELETAL:  No edema; No deformity  SKIN: Warm and dry NEUROLOGIC:  Alert and oriented x  3 PSYCHIATRIC:  Normal affect   Signed, Garwin Brothers, MD  11/15/2020 9:05 AM    [facility]

## 2020-11-16 LAB — BASIC METABOLIC PANEL
BUN/Creatinine Ratio: 12 (ref 9–23)
BUN: 13 mg/dL (ref 6–24)
CO2: 23 mmol/L (ref 20–29)
Calcium: 8.9 mg/dL (ref 8.7–10.2)
Chloride: 103 mmol/L (ref 96–106)
Creatinine, Ser: 1.1 mg/dL — ABNORMAL HIGH (ref 0.57–1.00)
Glucose: 130 mg/dL — ABNORMAL HIGH (ref 65–99)
Potassium: 5.2 mmol/L (ref 3.5–5.2)
Sodium: 138 mmol/L (ref 134–144)
eGFR: 63 mL/min/{1.73_m2} (ref >59)

## 2020-11-16 LAB — CBC WITH DIFFERENTIAL/PLATELET
Basophils Absolute: 0.1 10*3/uL (ref 0.0–0.2)
Basos: 1 %
EOS (ABSOLUTE): 0.1 10*3/uL (ref 0.0–0.4)
Eos: 2 %
Hematocrit: 40.5 % (ref 34.0–46.6)
Hemoglobin: 12.4 g/dL (ref 11.1–15.9)
Immature Grans (Abs): 0 10*3/uL (ref 0.0–0.1)
Immature Granulocytes: 0 %
Lymphocytes Absolute: 2.3 10*3/uL (ref 0.7–3.1)
Lymphs: 34 %
MCH: 24.2 pg — ABNORMAL LOW (ref 26.6–33.0)
MCHC: 30.6 g/dL — ABNORMAL LOW (ref 31.5–35.7)
MCV: 79 fL (ref 79–97)
Monocytes Absolute: 0.5 10*3/uL (ref 0.1–0.9)
Monocytes: 7 %
Neutrophils Absolute: 4 10*3/uL (ref 1.4–7.0)
Neutrophils: 56 %
Platelets: 334 10*3/uL (ref 150–450)
RBC: 5.13 x10E6/uL (ref 3.77–5.28)
RDW: 15.3 % (ref 11.7–15.4)
WBC: 6.9 10*3/uL (ref 3.4–10.8)

## 2020-11-16 LAB — HEPATIC FUNCTION PANEL
ALT: 18 IU/L (ref 0–32)
AST: 13 IU/L (ref 0–40)
Albumin: 4.1 g/dL (ref 3.8–4.8)
Alkaline Phosphatase: 109 IU/L (ref 44–121)
Bilirubin Total: 0.4 mg/dL (ref 0.0–1.2)
Bilirubin, Direct: <0.1 mg/dL (ref 0.00–0.40)
Total Protein: 6.5 g/dL (ref 6.0–8.5)

## 2020-11-16 LAB — HEMOGLOBIN A1C
Est. average glucose Bld gHb Est-mCnc: 131 mg/dL
Hgb A1c MFr Bld: 6.2 % — ABNORMAL HIGH (ref 4.8–5.6)

## 2020-11-16 LAB — LIPID PANEL
Chol/HDL Ratio: 2.8 ratio (ref 0.0–4.4)
Cholesterol, Total: 143 mg/dL (ref 100–199)
HDL: 52 mg/dL (ref >39)
LDL Chol Calc (NIH): 79 mg/dL (ref 0–99)
Triglycerides: 60 mg/dL (ref 0–149)
VLDL Cholesterol Cal: 12 mg/dL (ref 5–40)

## 2020-11-16 LAB — TSH: TSH: 1.55 u[IU]/mL (ref 0.450–4.500)

## 2020-11-16 LAB — VITAMIN D 25 HYDROXY (VIT D DEFICIENCY, FRACTURES): Vit D, 25-Hydroxy: 32.9 ng/mL (ref 30.0–100.0)

## 2020-11-21 ENCOUNTER — Other Ambulatory Visit: Payer: Self-pay | Admitting: Family Medicine

## 2020-11-22 NOTE — Telephone Encounter (Signed)
Attempted to call pt. No answer, line had continuous ring, could not leave VM.   Renee Brady 11/22/20 9:38 AM

## 2020-11-24 ENCOUNTER — Other Ambulatory Visit: Payer: Self-pay | Admitting: Family Medicine

## 2020-11-26 NOTE — Telephone Encounter (Signed)
Called pt. Pt states she no longer takes medication. Does not need refill.   Lorita Officer, [place] 11/26/20 4:03 PM

## 2020-11-26 NOTE — Telephone Encounter (Signed)
Attempted to call pt, line continuously ringing.   Renee Brady 11/26/20 9:19 AM

## 2020-11-27 ENCOUNTER — Other Ambulatory Visit: Payer: Self-pay | Admitting: Physician Assistant

## 2020-11-27 MED ORDER — MONTELUKAST SODIUM 10 MG PO TABS: 10.0000 mg | ORAL_TABLET | Freq: Every day | ORAL | 0 refills | Status: DC

## 2020-11-27 NOTE — Telephone Encounter (Signed)
Refill sent to pharmacy.   

## 2020-11-30 ENCOUNTER — Encounter: Payer: Self-pay | Admitting: Family Medicine

## 2020-11-30 ENCOUNTER — Other Ambulatory Visit: Payer: Self-pay

## 2020-11-30 DIAGNOSIS — M545 Low back pain, unspecified: Secondary | ICD-10-CM

## 2020-12-02 ENCOUNTER — Other Ambulatory Visit: Payer: Self-pay | Admitting: Family Medicine

## 2020-12-04 ENCOUNTER — Encounter: Payer: Self-pay | Admitting: Physical Medicine and Rehabilitation

## 2020-12-11 MED ORDER — METOPROLOL SUCCINATE ER 25 MG PO TB24: 25.0000 mg | ORAL_TABLET | Freq: Every day | ORAL | 3 refills | Status: DC

## 2020-12-25 ENCOUNTER — Ambulatory Visit: Payer: Medicare Other | Admitting: Family Medicine

## 2021-01-03 ENCOUNTER — Encounter: Payer: Self-pay | Admitting: Family Medicine

## 2021-01-10 ENCOUNTER — Ambulatory Visit: Payer: Medicare Other | Admitting: Family Medicine

## 2021-01-13 ENCOUNTER — Other Ambulatory Visit: Payer: Self-pay | Admitting: Family Medicine

## 2021-01-29 ENCOUNTER — Encounter: Payer: Medicare Other | Admitting: Physical Medicine and Rehabilitation

## 2021-02-05 LAB — HM MAMMOGRAPHY

## 2021-02-13 ENCOUNTER — Other Ambulatory Visit: Payer: Self-pay | Admitting: Family Medicine

## 2021-03-11 ENCOUNTER — Ambulatory Visit: Payer: Medicare Other | Admitting: Physical Medicine and Rehabilitation

## 2021-04-24 ENCOUNTER — Other Ambulatory Visit: Payer: Self-pay | Admitting: Family Medicine

## 2021-05-01 ENCOUNTER — Other Ambulatory Visit: Payer: Self-pay

## 2021-05-01 ENCOUNTER — Encounter: Payer: Self-pay | Admitting: Nurse Practitioner

## 2021-05-01 ENCOUNTER — Ambulatory Visit (INDEPENDENT_AMBULATORY_CARE_PROVIDER_SITE_OTHER): Payer: Medicare Other | Admitting: Nurse Practitioner

## 2021-05-01 VITALS — BP 118/90 | HR 89 | Temp 98.0°F | Ht 65.0 in | Wt 218.0 lb

## 2021-05-01 DIAGNOSIS — L237 Allergic contact dermatitis due to plants, except food: Secondary | ICD-10-CM | POA: Diagnosis not present

## 2021-05-01 MED ORDER — TRIAMCINOLONE ACETONIDE 0.1 % EX CREA
1.0000 | TOPICAL_CREAM | Freq: Two times a day (BID) | CUTANEOUS | 0 refills | Status: DC
Start: 2021-05-01 — End: 2022-08-29

## 2021-05-01 MED ORDER — TRIAMCINOLONE ACETONIDE 40 MG/ML IJ SUSP
60.0000 mg | Freq: Once | INTRAMUSCULAR | Status: AC
  Administered 2021-05-01: 11:00:00 60 mg via INTRAMUSCULAR

## 2021-05-01 NOTE — Patient Instructions (Addendum)
Kenalog injection given in office Apply Triamcinolone cream to rash Follow-up as needed Recommend counseling for anxiety [facility] [phone]    Poison Ivy Dermatitis Poison ivy dermatitis is redness and soreness of the skin caused by chemicals in the leaves of the poison ivy plant. You may have very bad itching, swelling, a rash, and blisters. What are the causes? Touching a poison ivy plant. Touching something that has the chemical on it. This may include animals or objects that have come in contact with the plant. What increases the risk? Going outdoors often in wooded or [place] areas. Going outdoors without wearing protective clothing, such as closed shoes, long pants, and a long-sleeved shirt. What are the signs or symptoms?  Skin redness. Very bad itching. A rash that often includes bumps and blisters. The rash usually appears 48 hours after exposure, if you have been exposed before. If this is the first time you have been exposed, the rash may not appear until a week after exposure. Swelling. This may occur if the reaction is very bad. Symptoms usually last for 1-2 weeks. The first time you develop this condition, symptoms may last 3-4 weeks. How is this treated? This condition may be treated with: Hydrocortisone cream or calamine lotion to relieve itching. Oatmeal baths to soothe the skin. Medicines, such as over-the-counter antihistamine tablets. Oral steroid medicine for more severe reactions. Follow these instructions at home: Medicines Take or apply over-the-counter and prescription medicines only as told by your doctor. Use hydrocortisone cream or calamine lotion as needed to help with itching. General instructions Do not scratch or rub your skin. Put a cold, wet cloth (cold compress) on the affected areas or take baths in cool water. This will help with itching. Avoid hot baths and showers. Take oatmeal baths as needed. Use colloidal oatmeal. You can  get this at a pharmacy or grocery store. Follow the instructions on the package. While you have the rash, wash your clothes right after you wear them. Keep all follow-up visits as told by your health care provider. This is important. How is this prevented?  Know what poison ivy looks like, so you can avoid it. This plant has three leaves with flowering branches on a single stem. The leaves are glossy. The leaves have uneven edges that come to a point at the front. If you touch poison ivy, wash your skin with soap and water right away. Be sure to wash under your fingernails. When hiking or camping, wear long pants, a long-sleeved shirt, tall socks, and hiking boots. You can also use a lotion on your skin that helps to prevent contact with poison ivy. If you think that your clothes or outdoor gear came in contact with poison ivy, rinse them off with a garden hose before you bring them inside your house. When doing yard work or gardening, wear gloves, long sleeves, long pants, and boots. Wash your garden tools and gloves if they come in contact with poison ivy. If you think that your pet has come into contact with poison ivy, wash him or her with pet shampoo and water. Make sure to wear gloves while washing your pet. Contact a doctor if: You have open sores in the rash area. You have more redness, swelling, or pain in the rash area. You have redness that spreads beyond the rash area. You have fluid, blood, or pus coming from the rash area. You have a fever. You have a rash over a large area of your body. You  have a rash on your eyes, mouth, or genitals. Your rash does not get better after a few weeks. Get help right away if: Your face swells or your eyes swell shut. You have trouble breathing. You have trouble swallowing. These symptoms may be an emergency. Do not wait to see if the symptoms will go away. Get medical help right away. Call your local emergency services (911 in the [place] Do  not drive yourself to the hospital. Summary Poison ivy dermatitis is redness and soreness of the skin caused by chemicals in the leaves of the poison ivy plant. You may have skin redness, very bad itching, swelling, and a rash. Do not scratch or rub your skin. Take or apply over-the-counter and prescription medicines only as told by your doctor. This information is not intended to replace advice given to you by your health care provider. Make sure you discuss any questions you have with your health care provider. Document Revised: 07/16/2018 Document Reviewed: 03/19/2018 Elsevier Patient Education  2022 [employer].

## 2021-05-01 NOTE — Progress Notes (Signed)
Acute Office Visit  Subjective:    Patient ID: Renee Brady, female    DOB: 1976-04-02, 46 y.o.   MRN: [id number]  CC Rash  HPI Patient is in today for rash to bilateral arms and face. Onset was one week ago.Treatment has included OTC topical cream and hydroxyzine for itching.  States she was exposed to poison ivy plant while doing yard work.   Past Medical History:  Diagnosis Date   ANA positive 06/26/2014   Formatting of this note might be different from the original. Overview:  Evaluated by Rheumatology Dr. Herma Carson - dx fibromyalgia given.   Angina pectoris (HCC) 05/25/2020   Ankle pain, left 08/27/2014   Formatting of this note might be different from the original. Overview:  Fell on this day with injury to Lt foot and ankle.   Anxiety and depression 04/03/2015   Asthma    CAD (coronary artery disease) 07/03/2020   Cervical radiculitis 10/23/2016   Chronic pain of left knee 09/18/2014   Formatting of this note might be different from the original. Overview:  Evaluated by Ortho.   Dysesthesia 01/26/2019   Epidural fibrosis 06/15/2019   Essential hypertension    Fibromyalgia muscle pain    GERD (gastroesophageal reflux disease)    Herniated nucleus pulposus, cervical 10/23/2016   High risk medication use 01/29/2018   Hyperlipidemia    Hypertension    Iron deficiency anemia    Lumbar radiculopathy 01/26/2019   Mixed dyslipidemia 05/25/2020   Multiple sclerosis (HCC) 12/22/2014   Overview:  JC virus antibody positive with index 2.67- checked 12/2014   Nausea 08/23/2018   Numbness and tingling 04/11/2014   Obesity (BMI 35.0-39.9 without comorbidity) 05/25/2020   Other headache syndrome 01/29/2018   Pes anserinus bursitis of right knee 07/24/2014   Formatting of this note might be different from the original. PT, Life-style modification, and NSAID PRN.   Raynaud's disease    Spinal stenosis in cervical region 10/23/2016   Trochanteric bursitis of right hip 07/13/2014   Formatting of this  note might be different from the original. Evaluated by Ortho.  Tx with CSI and PT.   Urinary urgency 01/29/2018   Vision abnormalities    Vitamin D deficiency 07/28/2014   Formatting of this note might be different from the original. Daily supplement.    Past Surgical History:  Procedure Laterality Date   ABDOMINAL HYSTERECTOMY     BACK SURGERY  2018   lumbar laminectomy/Microdiskectomy    CESAREAN SECTION  x 2   LEFT HEART CATH AND CORONARY ANGIOGRAPHY N/A 06/08/2020   Procedure: LEFT HEART CATH AND CORONARY ANGIOGRAPHY;  Surgeon: Kathleene Hazel, MD;  Location: [facility];  Service: Cardiovascular;  Laterality: N/A;   lumbar laminectomy Left 10/03/2016   TONSILLECTOMY     WISDOM TOOTH EXTRACTION      Family History  Problem Relation Age of Onset   Stroke Mother    Cardiomyopathy Mother    Hypertension Mother    Diabetes Mother    Hypertension Father    ADD / ADHD Father    Depression Father    Diabetes Brother     Social History   Socioeconomic History   Marital status: Married    Spouse name: Not on file   Number of children: Not on file   Years of education: Not on file   Highest education level: Not on file  Occupational History   Not on file  Tobacco Use   Smoking status: Former  Types: Cigarettes    Quit date: 2005    Years since quitting: 18.0   Smokeless tobacco: Never  Substance and Sexual Activity   Alcohol use: No   Drug use: Not Currently    Types: Marijuana   Sexual activity: Yes    Birth control/protection: Surgical  Other Topics Concern   Not on file  Social History Narrative   Not on file   Social Determinants of Health   Financial Resource Strain: Not on file  Food Insecurity: Not on file  Transportation Needs: Not on file  Physical Activity: Not on file  Stress: Not on file  Social Connections: Not on file  Intimate Partner Violence: Not on file    Outpatient Medications Prior to Visit  Medication Sig Dispense  Refill   albuterol (VENTOLIN HFA) 108 (90 Base) MCG/ACT inhaler Inhale 2 puffs into the lungs as needed for wheezing or shortness of breath. 1 each 2   amitriptyline (ELAVIL) 50 MG tablet TAKE 1 TABLET BY MOUTH EVERYDAY AT BEDTIME 90 tablet 3   aspirin EC 81 MG tablet Take 1 tablet (81 mg total) by mouth daily. Swallow whole. 90 tablet 3   atorvastatin (LIPITOR) 20 MG tablet Take 1 tablet (20 mg total) by mouth daily. 90 tablet 3   cyclobenzaprine (FLEXERIL) 5 MG tablet Take 5 mg by mouth as needed for muscle spasms.     diclofenac (VOLTAREN) 25 MG EC tablet TAKE 1 TABLET (25 MG TOTAL) BY MOUTH DAILY AS NEEDED FOR MILD PAIN. 90 tablet 0   hydrOXYzine (ATARAX/VISTARIL) 25 MG tablet Take 25 mg by mouth 3 (three) times daily as needed for itching.     ipratropium-albuterol (DUONEB) 0.5-2.5 (3) MG/3ML SOLN TAKE 3 MLS BY NEBULIZATION EVERY 6 (SIX) HOURS AS NEEDED (WHEEZING). 360 mL 0   isosorbide mononitrate (IMDUR) 30 MG 24 hr tablet Take 1 tablet (30 mg total) by mouth daily. 90 tablet 3   Melatonin 10 MG CAPS Take 10 mg by mouth at bedtime.     methocarbamol (ROBAXIN) 500 MG tablet Take 1,000 mg by mouth 2 (two) times daily.     metoprolol succinate (TOPROL XL) 25 MG 24 hr tablet Take 1 tablet (25 mg total) by mouth daily. 90 tablet 3   montelukast (SINGULAIR) 10 MG tablet TAKE 1 TABLET BY MOUTH EVERYDAY AT BEDTIME 90 tablet 0   NIFEdipine (ADALAT CC) 60 MG 24 hr tablet TAKE 1 TABLET BY MOUTH EVERY DAY 90 tablet 1   nitroGLYCERIN (NITROSTAT) 0.4 MG SL tablet Place 0.4 mg under the tongue every 5 (five) minutes as needed for chest pain.     NIFEdipine (PROCARDIA XL/NIFEDICAL XL) 60 MG 24 hr tablet Take 60 mg by mouth daily.     No facility-administered medications prior to visit.    Allergies  Allergen Reactions   Gabapentin Other (See Comments)    Change in Mental attitude   Latex Itching    Power   Meloxicam Nausea Only   Oxycodone Hcl Itching and Nausea Only   Percocet  [Oxycodone-Acetaminophen] Nausea Only   Prozac [Fluoxetine Hcl] Other (See Comments)    Mental Changes   Tecfidera [Dimethyl Fumarate] Diarrhea    Body shut down   Tramadol Itching   Zoloft [Sertraline] Other (See Comments)    Caused Heart Porblems   Buspirone Hcl Palpitations   Hydrochlorothiazide Palpitations   Penicillins Itching and Rash    Reaction: In her 20's    Review of Systems  Skin:  Positive for rash.  Psychiatric/Behavioral:  The patient is nervous/anxious (treated for anxiety).   All other systems reviewed and are negative.     Objective:    Physical Exam Vitals reviewed.  Skin:    Capillary Refill: Capillary refill takes less than 2 seconds.     Findings: Rash present.  Neurological:     General: No focal deficit present.     Mental Status: She is alert.  Psychiatric:        Mood and Affect: Mood normal.        Behavior: Behavior normal.    BP 118/90 (BP Location: Left Arm, Patient Position: Sitting)    Wt 218 lb (98.9 kg)    BMI 36.28 kg/m    Wt Readings from Last 3 Encounters:  05/01/21 218 lb (98.9 kg)  11/15/20 222 lb 12.8 oz (101.1 kg)  10/03/20 211 lb (95.7 kg)    Health Maintenance Due  Topic Date Due   TETANUS/TDAP  Never done   COVID-19 Vaccine (3 - Moderna risk series) 09/06/2019   COLONOSCOPY (Pts 45-41yrs Insurance coverage will need to be confirmed)  Never done       Lab Results  Component Value Date   TSH 1.550 11/15/2020   Lab Results  Component Value Date   WBC 6.9 11/15/2020   HGB 12.4 11/15/2020   HCT 40.5 11/15/2020   MCV 79 11/15/2020   PLT 334 11/15/2020   Lab Results  Component Value Date   NA 138 11/15/2020   K 5.2 11/15/2020   CO2 23 11/15/2020   GLUCOSE 130 (H) 11/15/2020   BUN 13 11/15/2020   CREATININE 1.10 (H) 11/15/2020   BILITOT 0.4 11/15/2020   ALKPHOS 109 11/15/2020   AST 13 11/15/2020   ALT 18 11/15/2020   PROT 6.5 11/15/2020   ALBUMIN 4.1 11/15/2020   CALCIUM 8.9 11/15/2020   EGFR 63  11/15/2020   Lab Results  Component Value Date   CHOL 143 11/15/2020   Lab Results  Component Value Date   HDL 52 11/15/2020   Lab Results  Component Value Date   LDLCALC 79 11/15/2020   Lab Results  Component Value Date   TRIG 60 11/15/2020   Lab Results  Component Value Date   CHOLHDL 2.8 11/15/2020   Lab Results  Component Value Date   HGBA1C 6.2 (H) 11/15/2020       Assessment & Plan:   1. Poison ivy dermatitis - triamcinolone acetonide (KENALOG-40) injection 60 mg - triamcinolone cream (KENALOG) 0.1 %; Apply 1 application topically 2 (two) times daily.  Dispense: 30 g; Refill: 0   Kenalog injection given in office Apply Triamcinolone cream to rash Follow-up as needed Recommend counseling for anxiety [facility] [phone]   Follow-up: PRN  I, Janie Morning, NP, have reviewed all documentation for this visit. The documentation on 05/01/21 for the exam, diagnosis, procedures, and orders are all accurate and complete.   Signed, Janie Morning, NP 05/01/21 at 1:28 PM

## 2021-05-02 ENCOUNTER — Other Ambulatory Visit: Payer: Self-pay | Admitting: Family Medicine

## 2021-05-02 MED ORDER — DULOXETINE HCL 30 MG PO CPEP: 30.0000 mg | ORAL_CAPSULE | Freq: Two times a day (BID) | ORAL | 0 refills | Status: DC

## 2021-05-05 ENCOUNTER — Other Ambulatory Visit: Payer: Self-pay | Admitting: Family Medicine

## 2021-05-07 MED ORDER — HYDROXYZINE HCL 25 MG PO TABS: 25.0000 mg | ORAL_TABLET | Freq: Three times a day (TID) | ORAL | 3 refills | Status: DC | PRN

## 2021-05-08 ENCOUNTER — Encounter: Payer: Self-pay | Admitting: Family Medicine

## 2021-05-14 ENCOUNTER — Encounter: Payer: Self-pay | Admitting: Family Medicine

## 2021-05-14 ENCOUNTER — Encounter: Payer: Self-pay | Admitting: Physical Medicine and Rehabilitation

## 2021-05-14 ENCOUNTER — Other Ambulatory Visit: Payer: Self-pay

## 2021-05-14 ENCOUNTER — Encounter
Payer: Medicare Other | Attending: Physical Medicine and Rehabilitation | Admitting: Physical Medicine and Rehabilitation

## 2021-05-14 VITALS — BP 137/94 | HR 92 | Temp 99.0°F | Ht 65.0 in | Wt 219.4 lb

## 2021-05-14 DIAGNOSIS — M5416 Radiculopathy, lumbar region: Secondary | ICD-10-CM | POA: Insufficient documentation

## 2021-05-14 DIAGNOSIS — S134XXS Sprain of ligaments of cervical spine, sequela: Secondary | ICD-10-CM | POA: Diagnosis not present

## 2021-05-14 MED ORDER — TOPIRAMATE 25 MG PO TABS: 25.0000 mg | ORAL_TABLET | Freq: Every evening | ORAL | 3 refills | Status: DC
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
## 2021-05-14 NOTE — Patient Instructions (Addendum)
Insomnia: -Try to go outside near sunrise -Get exercise during the day.  -continue melatonin -Turn off all devices an hour before bedtime.  -Teas that can benefit: chamomile, valerian root, Brahmi (Bacopa) -Can consider over the counter melatonin, magnesium, and/or L-theanine. Melatonin is an anti-oxidant with multiple health benefits. Magnesium is involved in greater than 300 enzymatic reactions in the body and most of [place] are deficient as our soil is often depleted. There are 7 different types of magnesium- Bioptemizer's is a supplement with all 7 types, and each has unique benefits. Magnesium can also help with constipation and anxiety.  -Pistachios naturally increase the production of melatonin -Cozy Earth bamboo bed sheets are free from toxic chemicals.  -Tart cherry juice or a tart cherry supplement can improve sleep and soreness post-workout  Foods that may reduce pain: 1) Ginger (especially studied for arthritis)- reduce leukotriene production to decrease inflammation 2) Blueberries- high in phytonutrients that decrease inflammation 3) Salmon- marine omega-3s reduce joint swelling and pain 4) Pumpkin seeds- reduce inflammation 5) dark chocolate- reduces inflammation 6) turmeric- reduces inflammation 7) tart cherries - reduce pain and stiffness 8) extra virgin olive oil - its compound olecanthal helps to block prostaglandins  9) chili peppers- can be eaten or applied topically via capsaicin 10) mint- helpful for headache, muscle aches, joint pain, and itching 11) garlic- reduces inflammation  Link to further information on diet for chronic pain: [email or web address]

## 2021-05-14 NOTE — Addendum Note (Signed)
Addended by: Horton Chin on: 05/14/2021 11:01 AM   Modules accepted: Orders

## 2021-05-15 ENCOUNTER — Other Ambulatory Visit: Payer: Self-pay | Admitting: Family Medicine

## 2021-05-15 ENCOUNTER — Encounter: Payer: Self-pay | Admitting: Physical Medicine and Rehabilitation

## 2021-05-15 NOTE — Telephone Encounter (Signed)
Refill sent to pharmacy.   

## 2021-05-16 ENCOUNTER — Encounter: Payer: Self-pay | Admitting: Family Medicine

## 2021-05-30 ENCOUNTER — Other Ambulatory Visit: Payer: Self-pay | Admitting: Physical Medicine and Rehabilitation

## 2021-05-30 MED ORDER — TOPIRAMATE 50 MG PO TABS: 50.0000 mg | ORAL_TABLET | Freq: Every evening | ORAL | 2 refills | Status: DC | PRN

## 2021-05-31 ENCOUNTER — Other Ambulatory Visit: Payer: Self-pay | Admitting: Family Medicine

## 2021-06-01 ENCOUNTER — Other Ambulatory Visit: Payer: Self-pay | Admitting: Family Medicine

## 2021-06-01 ENCOUNTER — Other Ambulatory Visit: Payer: Self-pay | Admitting: Cardiology

## 2021-06-03 ENCOUNTER — Other Ambulatory Visit: Payer: Self-pay

## 2021-06-04 ENCOUNTER — Other Ambulatory Visit: Payer: Self-pay

## 2021-06-06 ENCOUNTER — Ambulatory Visit (INDEPENDENT_AMBULATORY_CARE_PROVIDER_SITE_OTHER): Payer: Medicare Other | Admitting: Cardiology

## 2021-06-06 ENCOUNTER — Encounter: Payer: Self-pay | Admitting: Cardiology

## 2021-06-06 ENCOUNTER — Other Ambulatory Visit: Payer: Self-pay

## 2021-06-06 VITALS — BP 134/84 | HR 100 | Ht 65.0 in | Wt 216.4 lb

## 2021-06-06 DIAGNOSIS — E785 Hyperlipidemia, unspecified: Secondary | ICD-10-CM

## 2021-06-06 DIAGNOSIS — I2510 Atherosclerotic heart disease of native coronary artery without angina pectoris: Secondary | ICD-10-CM

## 2021-06-06 DIAGNOSIS — I10 Essential (primary) hypertension: Secondary | ICD-10-CM | POA: Diagnosis not present

## 2021-06-06 DIAGNOSIS — E669 Obesity, unspecified: Secondary | ICD-10-CM

## 2021-06-06 DIAGNOSIS — E559 Vitamin D deficiency, unspecified: Secondary | ICD-10-CM

## 2021-06-06 DIAGNOSIS — E782 Mixed hyperlipidemia: Secondary | ICD-10-CM

## 2021-06-06 DIAGNOSIS — Z79899 Other long term (current) drug therapy: Secondary | ICD-10-CM

## 2021-06-06 NOTE — Patient Instructions (Addendum)
Medication Instructions:  ? ?Your physician recommends that you continue on your current medications as directed. Please refer to the Current Medication list given to you today. ? ?*If you need a refill on your cardiac medications before your next appointment, please call your pharmacy* ? ? ?Lab Work:   BMET CBC TSH LFT HGBA1C ? ? ?If you have labs (blood work) drawn today and your tests are completely normal, you will receive your results only by: ?MyChart Message (if you have MyChart) OR ?A paper copy in the mail ?If you have any lab test that is abnormal or we need to change your treatment, we will call you to review the results. ? ? ?Testing/Procedures: NONE ORDERED  TODAY ? ? ? ?Follow-Up: ?At [facility], you and your health needs are our priority.  As part of our continuing mission to provide you with exceptional heart care, we have created designated Provider Care Teams.  These Care Teams include your primary Cardiologist (physician) and Advanced Practice Providers (APPs -  Physician Assistants and Nurse Practitioners) who all work together to provide you with the care you need, when you need it. ? ?We recommend signing up for the patient portal called "MyChart".  Sign up information is provided on this After Visit Summary.  MyChart is used to connect with patients for Virtual Visits (Telemedicine).  Patients are able to view lab/test results, encounter notes, upcoming appointments, etc.  Non-urgent messages can be sent to your provider as well.   ?To learn more about what you can do with MyChart, go to [email or web address].   ? ?Your next appointment:   ?9 month(s) ? ?The format for your next appointment:   ?In Person ? ?Provider:   ?Belva Crome, MD ? ? ?Other Instructions ? ?

## 2021-06-06 NOTE — Progress Notes (Signed)
Cardiology Office Note:    Date:  06/06/2021   ID:  Renee Brady, DOB January 02, 1976, MRN [id number]  PCP:  Blane Ohara, MD  Cardiologist:  Garwin Brothers, MD   Referring MD: Blane Ohara, MD    ASSESSMENT:    1. Coronary artery disease involving native coronary artery of native heart without angina pectoris   2. Hyperlipidemia, unspecified hyperlipidemia type   3. Essential hypertension   4. Obesity (BMI 35.0-39.9 without comorbidity)    PLAN:    In order of problems listed above:  Coronary artery disease: Secondary prevention stressed to the patient.  Importance of compliance with diet medication stressed and she vocalized understanding.  She was advised to do aerobic exercises to the best of her ability and she promises to do so. Essential hypertension: Blood pressure stable and diet was emphasized.  Lifestyle modification urged. Mixed dyslipidemia: Lipids were reviewed.  She is fasting and will have complete blood work done today. I also will do hemoglobin A1c and vitamin D per patient request.  She mentions history of vitamin D deficiency. Patient will be seen in follow-up appointment in 6 months or earlier if the patient has any concerns   Medication Adjustments/Labs and Tests Ordered: Current medicines are reviewed at length with the patient today.  Concerns regarding medicines are outlined above.  No orders of the defined types were placed in this encounter.  No orders of the defined types were placed in this encounter.    No chief complaint on file.    History of Present Illness:    Renee Brady is a 46 y.o. female.  Patient has past medical history of coronary artery disease, essential hypertension, dyslipidemia and obesity.  She leads a sedentary lifestyle because of orthopedic issues.  She denies any chest pain orthopnea or PND.  At the time of my evaluation, the patient is alert awake oriented and in no distress.  Past Medical History:  Diagnosis Date    ANA positive 06/26/2014   Formatting of this note might be different from the original. Overview:  Evaluated by Rheumatology Dr. Herma Carson - dx fibromyalgia given.   Angina pectoris (HCC) 05/25/2020   Ankle pain, left 08/27/2014   Formatting of this note might be different from the original. Overview:  Fell on this day with injury to Lt foot and ankle.   Anxiety and depression 04/03/2015   Asthma    CAD (coronary artery disease) 07/03/2020   Cervical radiculitis 10/23/2016   Chronic pain of left knee 09/18/2014   Formatting of this note might be different from the original. Overview:  Evaluated by Ortho.   Dysesthesia 01/26/2019   Epidural fibrosis 06/15/2019   Essential hypertension    Fibromyalgia muscle pain    GERD (gastroesophageal reflux disease)    Herniated nucleus pulposus, cervical 10/23/2016   High risk medication use 01/29/2018   Hyperlipidemia    Hypertension    Iron deficiency anemia    Lumbar radiculopathy 01/26/2019   Mixed dyslipidemia 05/25/2020   Multiple sclerosis (HCC) 12/22/2014   Overview:  JC virus antibody positive with index 2.67- checked 12/2014   Nausea 08/23/2018   Numbness and tingling 04/11/2014   Obesity (BMI 35.0-39.9 without comorbidity) 05/25/2020   Other headache syndrome 01/29/2018   Pes anserinus bursitis of right knee 07/24/2014   Formatting of this note might be different from the original. PT, Life-style modification, and NSAID PRN.   Raynaud's disease    Spinal stenosis in cervical region 10/23/2016   Trochanteric bursitis of  right hip 07/13/2014   Formatting of this note might be different from the original. Evaluated by Ortho.  Tx with CSI and PT.   Urinary urgency 01/29/2018   Vision abnormalities    Vitamin D deficiency 07/28/2014   Formatting of this note might be different from the original. Daily supplement.    Past Surgical History:  Procedure Laterality Date   ABDOMINAL HYSTERECTOMY     BACK SURGERY  2018   lumbar laminectomy/Microdiskectomy     CESAREAN SECTION  x 2   LEFT HEART CATH AND CORONARY ANGIOGRAPHY N/A 06/08/2020   Procedure: LEFT HEART CATH AND CORONARY ANGIOGRAPHY;  Surgeon: Kathleene Hazel, MD;  Location: [facility];  Service: Cardiovascular;  Laterality: N/A;   lumbar laminectomy Left 10/03/2016   TONSILLECTOMY     WISDOM TOOTH EXTRACTION      Current Medications: Current Meds  Medication Sig   albuterol (VENTOLIN HFA) 108 (90 Base) MCG/ACT inhaler Inhale 2 puffs into the lungs as needed for wheezing or shortness of breath.   ALPRAZolam (XANAX) 0.5 MG tablet Take 0.5 mg by mouth as needed for anxiety.   amitriptyline (ELAVIL) 50 MG tablet TAKE 1 TABLET BY MOUTH EVERYDAY AT BEDTIME   aspirin EC 81 MG tablet Take 1 tablet (81 mg total) by mouth daily. Swallow whole.   atorvastatin (LIPITOR) 20 MG tablet Take 1 tablet (20 mg total) by mouth daily.   cyclobenzaprine (FLEXERIL) 5 MG tablet Take 5 mg by mouth as needed for muscle spasms.   diclofenac (VOLTAREN) 25 MG EC tablet TAKE 1 TABLET (25 MG TOTAL) BY MOUTH DAILY AS NEEDED FOR MILD PAIN.   hydrOXYzine (ATARAX) 25 MG tablet TAKE 1 TABLET BY MOUTH 3 TIMES DAILY AS NEEDED FOR ITCHING.   ipratropium-albuterol (DUONEB) 0.5-2.5 (3) MG/3ML SOLN TAKE 3 MLS BY NEBULIZATION EVERY 6 (SIX) HOURS AS NEEDED (WHEEZING).   isosorbide mononitrate (IMDUR) 30 MG 24 hr tablet Take 1 tablet (30 mg total) by mouth daily.   Melatonin 10 MG CAPS Take 10 mg by mouth at bedtime.   methocarbamol (ROBAXIN) 500 MG tablet Take 1,000 mg by mouth 2 (two) times daily.   metoprolol succinate (TOPROL XL) 25 MG 24 hr tablet Take 1 tablet (25 mg total) by mouth daily.   montelukast (SINGULAIR) 10 MG tablet TAKE 1 TABLET BY MOUTH EVERYDAY AT BEDTIME   NIFEdipine (PROCARDIA XL/NIFEDICAL XL) 60 MG 24 hr tablet TAKE 1 TABLET BY MOUTH EVERY DAY   nitroGLYCERIN (NITROSTAT) 0.4 MG SL tablet Place 0.4 mg under the tongue every 5 (five) minutes as needed for chest pain.   topiramate (TOPAMAX) 50  MG tablet Take 1 tablet (50 mg total) by mouth at bedtime as needed.   triamcinolone cream (KENALOG) 0.1 % Apply 1 application topically 2 (two) times daily.     Allergies:   Gabapentin, Latex, Meloxicam, Oxycodone hcl, Percocet [oxycodone-acetaminophen], Prozac [fluoxetine hcl], Tecfidera [dimethyl fumarate], Tramadol, Zoloft [sertraline], Buspirone hcl, Hydrochlorothiazide, and Penicillins   Social History   Socioeconomic History   Marital status: Married    Spouse name: Not on file   Number of children: Not on file   Years of education: Not on file   Highest education level: Not on file  Occupational History   Not on file  Tobacco Use   Smoking status: Former    Types: Cigarettes    Quit date: 2005    Years since quitting: 18.1   Smokeless tobacco: Never  Vaping Use   Vaping Use: Never  used  Substance and Sexual Activity   Alcohol use: No   Drug use: Not Currently    Types: Marijuana   Sexual activity: Yes    Birth control/protection: Surgical  Other Topics Concern   Not on file  Social History Narrative   Not on file   Social Determinants of Health   Financial Resource Strain: Not on file  Food Insecurity: Not on file  Transportation Needs: Not on file  Physical Activity: Not on file  Stress: Not on file  Social Connections: Not on file     Family History: The patient's family history includes ADD / ADHD in her father; Cardiomyopathy in her mother; Depression in her father; Diabetes in her brother and mother; Hypertension in her father and mother; Stroke in her mother.  ROS:   Please see the history of present illness.    All other systems reviewed and are negative.  EKGs/Labs/Other Studies Reviewed:    The following studies were reviewed today: EKG revealed sinus rhythm and nonspecific ST-T changes   Recent Labs: 11/15/2020: ALT 18; BUN 13; Creatinine, Ser 1.10; Hemoglobin 12.4; Platelets 334; Potassium 5.2; Sodium 138; TSH 1.550  Recent Lipid Panel     Component Value Date/Time   CHOL 143 11/15/2020 0913   TRIG 60 11/15/2020 0913   HDL 52 11/15/2020 0913   CHOLHDL 2.8 11/15/2020 0913   LDLCALC 79 11/15/2020 0913    Physical Exam:    VS:  BP 134/84    Pulse 100    Ht 5\' 5"  (1.651 m)    Wt 216 lb 6.4 oz (98.2 kg)    SpO2 99%    BMI 36.01 kg/m     Wt Readings from Last 3 Encounters:  06/06/21 216 lb 6.4 oz (98.2 kg)  05/14/21 219 lb 6.4 oz (99.5 kg)  05/01/21 218 lb (98.9 kg)     GEN: Patient is in no acute distress HEENT: Normal NECK: No JVD; No carotid bruits LYMPHATICS: No lymphadenopathy CARDIAC: Hear sounds regular, 2/6 systolic murmur at the apex. RESPIRATORY:  Clear to auscultation without rales, wheezing or rhonchi  ABDOMEN: Soft, non-tender, non-distended MUSCULOSKELETAL:  No edema; No deformity  SKIN: Warm and dry NEUROLOGIC:  Alert and oriented x 3 PSYCHIATRIC:  Normal affect   Signed, Garwin Brothers, MD  06/06/2021 8:55 AM    [facility]

## 2021-06-06 NOTE — Addendum Note (Signed)
Addended by: Velma Agnes, Elmarie Shiley L on: 06/06/2021 09:03 AM ? ? Modules accepted: Orders ? ?

## 2021-06-07 LAB — CBC
Hematocrit: 37.1 % (ref 34.0–46.6)
Hemoglobin: 12.4 g/dL (ref 11.1–15.9)
MCH: 25.2 pg — ABNORMAL LOW (ref 26.6–33.0)
MCHC: 33.4 g/dL (ref 31.5–35.7)
MCV: 75 fL — ABNORMAL LOW (ref 79–97)
Platelets: 317 10*3/uL (ref 150–450)
RBC: 4.92 x10E6/uL (ref 3.77–5.28)
RDW: 13.2 % (ref 11.7–15.4)
WBC: 7.4 10*3/uL (ref 3.4–10.8)

## 2021-06-07 LAB — BASIC METABOLIC PANEL
BUN/Creatinine Ratio: 17 (ref 9–23)
BUN: 18 mg/dL (ref 6–24)
CO2: 21 mmol/L (ref 20–29)
Calcium: 9.3 mg/dL (ref 8.7–10.2)
Chloride: 105 mmol/L (ref 96–106)
Creatinine, Ser: 1.03 mg/dL — ABNORMAL HIGH (ref 0.57–1.00)
Glucose: 132 mg/dL — ABNORMAL HIGH (ref 70–99)
Potassium: 4.1 mmol/L (ref 3.5–5.2)
Sodium: 138 mmol/L (ref 134–144)
eGFR: 68 mL/min/{1.73_m2} (ref >59)

## 2021-06-07 LAB — LIPID PANEL
Chol/HDL Ratio: 2.2 ratio (ref 0.0–4.4)
Cholesterol, Total: 121 mg/dL (ref 100–199)
HDL: 54 mg/dL (ref >39)
LDL Chol Calc (NIH): 56 mg/dL (ref 0–99)
Triglycerides: 46 mg/dL (ref 0–149)
VLDL Cholesterol Cal: 11 mg/dL (ref 5–40)

## 2021-06-07 LAB — HEPATIC FUNCTION PANEL
ALT: 24 IU/L (ref 0–32)
AST: 17 IU/L (ref 0–40)
Albumin: 4.2 g/dL (ref 3.8–4.8)
Alkaline Phosphatase: 103 IU/L (ref 44–121)
Bilirubin Total: 0.4 mg/dL (ref 0.0–1.2)
Bilirubin, Direct: 0.11 mg/dL (ref 0.00–0.40)
Total Protein: 6.6 g/dL (ref 6.0–8.5)

## 2021-06-07 LAB — HEMOGLOBIN A1C
Est. average glucose Bld gHb Est-mCnc: 131 mg/dL
Hgb A1c MFr Bld: 6.2 % — ABNORMAL HIGH (ref 4.8–5.6)

## 2021-06-07 LAB — TSH: TSH: 1.24 u[IU]/mL (ref 0.450–4.500)

## 2021-06-07 LAB — VITAMIN D 25 HYDROXY (VIT D DEFICIENCY, FRACTURES): Vit D, 25-Hydroxy: 35.2 ng/mL (ref 30.0–100.0)

## 2021-06-17 ENCOUNTER — Encounter: Payer: Self-pay | Admitting: Family Medicine

## 2021-06-18 ENCOUNTER — Encounter: Payer: Self-pay | Admitting: Physical Medicine and Rehabilitation

## 2021-06-18 ENCOUNTER — Ambulatory Visit: Payer: Medicare Other | Admitting: Nurse Practitioner

## 2021-06-18 ENCOUNTER — Encounter
Payer: Medicare Other | Attending: Physical Medicine and Rehabilitation | Admitting: Physical Medicine and Rehabilitation

## 2021-06-18 ENCOUNTER — Other Ambulatory Visit: Payer: Self-pay

## 2021-06-18 DIAGNOSIS — B3749 Other urogenital candidiasis: Secondary | ICD-10-CM | POA: Diagnosis not present

## 2021-06-18 DIAGNOSIS — M25559 Pain in unspecified hip: Secondary | ICD-10-CM | POA: Insufficient documentation

## 2021-06-18 MED ORDER — FLUCONAZOLE 100 MG PO TABS: 100.0000 mg | ORAL_TABLET | Freq: Every day | ORAL | 0 refills | Status: DC

## 2021-06-18 MED ORDER — TOPIRAMATE 50 MG PO TABS: 50.0000 mg | ORAL_TABLET | Freq: Every evening | ORAL | 2 refills | Status: DC | PRN
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
## 2021-07-03 ENCOUNTER — Ambulatory Visit (INDEPENDENT_AMBULATORY_CARE_PROVIDER_SITE_OTHER): Payer: Medicare Other

## 2021-07-03 DIAGNOSIS — Z0000 Encounter for general adult medical examination without abnormal findings: Secondary | ICD-10-CM | POA: Diagnosis not present

## 2021-07-03 NOTE — Patient Instructions (Signed)
Fall Prevention in the Home, Adult ?Falls can cause injuries and can happen to people of all ages. There are many things you can do to make your home safe and to help prevent falls. Ask for help when making these changes. ?What actions can I take to prevent falls? ?General Instructions ?Use good lighting in all rooms. Replace any light bulbs that burn out. ?Turn on the lights in dark areas. Use night-lights. ?Keep items that you use often in easy-to-reach places. Lower the shelves around your home if needed. ?Set up your furniture so you have a clear path. Avoid moving your furniture around. ?Do not have throw rugs or other things on the floor that can make you trip. ?Avoid walking on wet floors. ?If any of your floors are uneven, fix them. ?Add color or contrast paint or tape to clearly mark and help you see: ?Grab bars or handrails. ?First and last steps of staircases. ?Where the edge of each step is. ?If you use a stepladder: ?Make sure that it is fully opened. Do not climb a closed stepladder. ?Make sure the sides of the stepladder are locked in place. ?Ask someone to hold the stepladder while you use it. ?Know where your pets are when moving through your home. ?What can I do in the bathroom? ?  ?Keep the floor dry. Clean up any water on the floor right away. ?Remove soap buildup in the tub or shower. ?Use nonskid mats or decals on the floor of the tub or shower. ?Attach bath mats securely with double-sided, nonslip rug tape. ?If you need to sit down in the shower, use a plastic, nonslip stool. ?Install grab bars by the toilet and in the tub and shower. Do not use towel bars as grab bars. ?What can I do in the bedroom? ?Make sure that you have a light by your bed that is easy to reach. ?Do not use any sheets or blankets for your bed that hang to the floor. ?Have a firm chair with side arms that you can use for support when you get dressed. ?What can I do in the kitchen? ?Clean up any spills right away. ?If you  need to reach something above you, use a step stool with a grab bar. ?Keep electrical cords out of the way. ?Do not use floor polish or wax that makes floors slippery. ?What can I do with my stairs? ?Do not leave any items on the stairs. ?Make sure that you have a light switch at the top and the bottom of the stairs. ?Make sure that there are handrails on both sides of the stairs. Fix handrails that are broken or loose. ?Install nonslip stair treads on all your stairs. ?Avoid having throw rugs at the top or bottom of the stairs. ?Choose a carpet that does not hide the edge of the steps on the stairs. ?Check carpeting to make sure that it is firmly attached to the stairs. Fix carpet that is loose or worn. ?What can I do on the outside of my home? ?Use bright outdoor lighting. ?Fix the edges of walkways and driveways and fix any cracks. ?Remove anything that might make you trip as you walk through a door, such as a raised step or threshold. ?Trim any bushes or trees on paths to your home. ?Check to see if handrails are loose or broken and that both sides of all steps have handrails. ?Install guardrails along the edges of any raised decks and porches. ?Clear paths of anything that can   make you trip, such as tools or rocks. ?Have leaves, snow, or ice cleared regularly. ?Use sand or salt on paths during winter. ?Clean up any spills in your garage right away. This includes grease or oil spills. ?What other actions can I take? ?Wear shoes that: ?Have a low heel. Do not wear high heels. ?Have rubber bottoms. ?Feel good on your feet and fit well. ?Are closed at the toe. Do not wear open-toe sandals. ?Use tools that help you move around if needed. These include: ?Canes. ?Walkers. ?Scooters. ?Crutches. ?Review your medicines with your doctor. Some medicines can make you feel dizzy. This can increase your chance of falling. ?Ask your doctor what else you can do to help prevent falls. ?Where to find more information ?Centers for  Disease Control and Prevention, [facility]: www.cdc.gov ?National Institute on Aging: www.nia.nih.gov ?Contact a doctor if: ?You are afraid of falling at home. ?You feel weak, drowsy, or dizzy at home. ?You fall at home. ?Summary ?There are many simple things that you can do to make your home safe and to help prevent falls. ?Ways to make your home safe include removing things that can make you trip and installing grab bars in the bathroom. ?Ask for help when making these changes in your home. ?This information is not intended to replace advice given to you by your health care provider. Make sure you discuss any questions you have with your health care provider. ?Document Revised: 10/26/2019 Document Reviewed: 10/26/2019 ?Elsevier Patient Education ? 2022 [employer] ? ?Health Maintenance, Female ?Adopting a healthy lifestyle and getting preventive care are important in promoting health and wellness. Ask your health care provider about: ?The right schedule for you to have regular tests and exams. ?Things you can do on your own to prevent diseases and keep yourself healthy. ?What should I know about diet, weight, and exercise? ?Eat a healthy diet ? ?Eat a diet that includes plenty of vegetables, fruits, low-fat dairy products, and lean protein. ?Do not eat a lot of foods that are high in solid fats, added sugars, or sodium. ?Maintain a healthy weight ?Body mass index (BMI) is used to identify weight problems. It estimates body fat based on height and weight. Your health care provider can help determine your BMI and help you achieve or maintain a healthy weight. ?Get regular exercise ?Get regular exercise. This is one of the most important things you can do for your health. Most adults should: ?Exercise for at least 150 minutes each week. The exercise should increase your heart rate and make you sweat (moderate-intensity exercise). ?Do strengthening exercises at least twice a week. This is in addition to the  moderate-intensity exercise. ?Spend less time sitting. Even light physical activity can be beneficial. ?Watch cholesterol and blood lipids ?Have your blood tested for lipids and cholesterol at 46 years of age, then have this test every 5 years. ?Have your cholesterol levels checked more often if: ?Your lipid or cholesterol levels are high. ?You are older than 46 years of age. ?You are at high risk for heart disease. ?What should I know about cancer screening? ?Depending on your health history and family history, you may need to have cancer screening at various ages. This may include screening for: ?Breast cancer. ?Cervical cancer. ?Colorectal cancer. ?Skin cancer. ?Lung cancer. ?What should I know about heart disease, diabetes, and high blood pressure? ?Blood pressure and heart disease ?High blood pressure causes heart disease and increases the risk of stroke. This is more likely to develop in   people who have high blood pressure readings or are overweight. ?Have your blood pressure checked: ?Every 3-5 years if you are 18-39 years of age. ?Every year if you are 40 years old or older. ?Diabetes ?Have regular diabetes screenings. This checks your fasting blood sugar level. Have the screening done: ?Once every three years after age 40 if you are at a normal weight and have a low risk for diabetes. ?More often and at a younger age if you are overweight or have a high risk for diabetes. ?What should I know about preventing infection? ?Hepatitis B ?If you have a higher risk for hepatitis B, you should be screened for this virus. Talk with your health care provider to find out if you are at risk for hepatitis B infection. ?Hepatitis C ?Testing is recommended for: ?Everyone born from 1945 through 1965. ?Anyone with known risk factors for hepatitis C. ?Sexually transmitted infections (STIs) ?Get screened for STIs, including gonorrhea and chlamydia, if: ?You are sexually active and are younger than 46 years of age. ?You are  older than 46 years of age and your health care provider tells you that you are at risk for this type of infection. ?Your sexual activity has changed since you were last screened, and you are at increased risk for

## 2021-07-03 NOTE — Progress Notes (Addendum)
? ?Subjective:  ? Renee Brady is a 46 y.o. female who presents for Medicare Annual (Subsequent) preventive examination. ? ?I connected with  [name] on 07/03/21 by a audio enabled telemedicine application and verified that I am speaking with the correct person using two identifiers. ? ?Patient Location: Home ? ?Provider Location: Office/Clinic ? ?I discussed the limitations of evaluation and management by telemedicine. The patient expressed understanding and agreed to proceed. ? ?Cardiac Risk Factors include: obesity (BMI >30kg/m2) ? ?   ?Objective:  ?  ?There were no vitals filed for this visit. ?There is no height or weight on file to calculate BMI. ? ? ?  06/08/2020  ?  9:02 AM 11/29/2014  ? 10:38 AM  ?Advanced Directives  ?Does Patient Have a Medical Advance Directive? No No  ?Would patient like information on creating a medical advance directive? Yes (ED - Information included in AVS) No - patient declined information  ? ? ?Current Medications (verified) ?Outpatient Encounter Medications as of 07/03/2021  ?Medication Sig  ? albuterol (VENTOLIN HFA) 108 (90 Base) MCG/ACT inhaler Inhale 2 puffs into the lungs as needed for wheezing or shortness of breath.  ? ALPRAZolam (XANAX) 0.5 MG tablet Take 0.5 mg by mouth as needed for anxiety.  ? amitriptyline (ELAVIL) 50 MG tablet TAKE 1 TABLET BY MOUTH EVERYDAY AT BEDTIME  ? aspirin EC 81 MG tablet Take 1 tablet (81 mg total) by mouth daily. Swallow whole.  ? atorvastatin (LIPITOR) 20 MG tablet Take 1 tablet (20 mg total) by mouth daily.  ? cyclobenzaprine (FLEXERIL) 5 MG tablet Take 5 mg by mouth as needed for muscle spasms.  ? diclofenac (VOLTAREN) 25 MG EC tablet TAKE 1 TABLET (25 MG TOTAL) BY MOUTH DAILY AS NEEDED FOR MILD PAIN.  ? fluconazole (DIFLUCAN) 100 MG tablet Take 1 tablet (100 mg total) by mouth daily.  ? hydrOXYzine (ATARAX) 25 MG tablet TAKE 1 TABLET BY MOUTH 3 TIMES DAILY AS NEEDED FOR ITCHING.  ? ipratropium-albuterol (DUONEB) 0.5-2.5 (3) MG/3ML  SOLN TAKE 3 MLS BY NEBULIZATION EVERY 6 (SIX) HOURS AS NEEDED (WHEEZING).  ? isosorbide mononitrate (IMDUR) 30 MG 24 hr tablet Take 1 tablet (30 mg total) by mouth daily.  ? Melatonin 10 MG CAPS Take 10 mg by mouth at bedtime.  ? methocarbamol (ROBAXIN) 500 MG tablet Take 1,000 mg by mouth 2 (two) times daily.  ? metoprolol succinate (TOPROL XL) 25 MG 24 hr tablet Take 1 tablet (25 mg total) by mouth daily.  ? montelukast (SINGULAIR) 10 MG tablet TAKE 1 TABLET BY MOUTH EVERYDAY AT BEDTIME  ? NIFEdipine (PROCARDIA XL/NIFEDICAL XL) 60 MG 24 hr tablet TAKE 1 TABLET BY MOUTH EVERY DAY  ? nitroGLYCERIN (NITROSTAT) 0.4 MG SL tablet Place 0.4 mg under the tongue every 5 (five) minutes as needed for chest pain.  ? topiramate (TOPAMAX) 50 MG tablet Take 1 tablet (50 mg total) by mouth at bedtime as needed.  ? triamcinolone cream (KENALOG) 0.1 % Apply 1 application topically 2 (two) times daily.  ? ?No facility-administered encounter medications on file as of 07/03/2021.  ? ? ?Allergies (verified) ?Gabapentin, Latex, Meloxicam, Oxycodone hcl, Percocet [oxycodone-acetaminophen], Prozac [fluoxetine hcl], Tecfidera [dimethyl fumarate], Tramadol, Zoloft [sertraline], Buspirone hcl, Hydrochlorothiazide, and Penicillins  ? ?History: ?Past Medical History:  ?Diagnosis Date  ? ANA positive 06/26/2014  ? Formatting of this note might be different from the original. Overview:  Evaluated by Rheumatology Dr. Herma Carson - dx fibromyalgia given.  ? Angina pectoris (HCC) 05/25/2020  ? Ankle  pain, left 08/27/2014  ? Formatting of this note might be different from the original. Overview:  Fell on this day with injury to Lt foot and ankle.  ? Anxiety and depression 04/03/2015  ? Asthma   ? CAD (coronary artery disease) 07/03/2020  ? Cervical radiculitis 10/23/2016  ? Chronic pain of left knee 09/18/2014  ? Formatting of this note might be different from the original. Overview:  Evaluated by Ortho.  ? Dysesthesia 01/26/2019  ? Epidural fibrosis  06/15/2019  ? Essential hypertension   ? Fibromyalgia muscle pain 05/2014  ? GERD (gastroesophageal reflux disease)   ? Herniated nucleus pulposus, cervical 10/23/2016  ? High risk medication use 01/29/2018  ? Hyperlipidemia   ? Hypertension   ? Iron deficiency anemia   ? Lumbar radiculopathy 01/26/2019  ? Mixed dyslipidemia 05/25/2020  ? Multiple sclerosis (HCC) 12/22/2014  ? Overview:  JC virus antibody positive with index 2.67- checked 12/2014  ? Nausea 08/23/2018  ? Numbness and tingling 04/11/2014  ? Obesity (BMI 35.0-39.9 without comorbidity) 05/25/2020  ? Other headache syndrome 01/29/2018  ? Pes anserinus bursitis of right knee 07/24/2014  ? Formatting of this note might be different from the original. PT, Life-style modification, and NSAID PRN.  ? Raynaud's disease   ? Spinal stenosis in cervical region 10/23/2016  ? Trochanteric bursitis of right hip 07/13/2014  ? Formatting of this note might be different from the original. Evaluated by Ortho.  Tx with CSI and PT.  ? Urinary urgency 01/29/2018  ? Vision abnormalities   ? Vitamin D deficiency 07/28/2014  ? Formatting of this note might be different from the original. Daily supplement.  ? ?Past Surgical History:  ?Procedure Laterality Date  ? ABDOMINAL HYSTERECTOMY    ? uterus and cervix removed but still has ovaries  ? BACK SURGERY  2018  ? lumbar laminectomy/Microdiskectomy   ? CESAREAN SECTION  x 2  ? LEFT HEART CATH AND CORONARY ANGIOGRAPHY N/A 06/08/2020  ? Procedure: LEFT HEART CATH AND CORONARY ANGIOGRAPHY;  Surgeon: Kathleene Hazel, MD;  Location: [facility];  Service: Cardiovascular;  Laterality: N/A;  ? lumbar laminectomy Left 10/03/2016  ? TONSILLECTOMY    ? WISDOM TOOTH EXTRACTION    ? ?Family History  ?Problem Relation Age of Onset  ? Stroke Mother   ? Cardiomyopathy Mother   ? Hypertension Mother   ? Diabetes Mother   ? Hypertension Father   ? ADD / ADHD Father   ? Depression Father   ? Diabetes Brother   ? ?Social History   ? ?Socioeconomic History  ? Marital status: Married  ? Number of children: 2  ? Highest education level: Associate degree: academic program  ?Tobacco Use  ? Smoking status: Former  ?  Types: Cigarettes  ?  Quit date: 2005  ?  Years since quitting: 18.2  ? Smokeless tobacco: Never  ?Vaping Use  ? Vaping Use: Never used  ?Substance and Sexual Activity  ? Alcohol use: No  ? Drug use: Not Currently  ?  Types: Marijuana  ? Sexual activity: Yes  ?  Birth control/protection: Surgical  ?Social History Narrative  ? One son, one daughter  ? Associates degree in business  ? ?Social Determinants of Health  ? ?Financial Resource Strain: Not on file  ?Food Insecurity: Not on file  ?Transportation Needs: Not on file  ?Physical Activity: Not on file  ?Stress: Not on file  ?Social Connections: Not on file  ? ? ?Tobacco Counseling ?Counseling given:  Patient does not use tobacco products ? ? ?Clinical Intake: ? ?Pre-visit preparation completed: Yes ?Pain : No/denies pain ?  ?BMI - recorded: 36.01 ?Nutritional Status: BMI > 30  Obese ?Nutritional Risks: None ?Diabetes: No ?How often do you need to have someone help you when you read instructions, pamphlets, or other written materials from your doctor or pharmacy?: 1 - Never ?Interpreter Needed?: No ?  ? ? ?Activities of Daily Living ? ?  07/03/2021  ?  3:30 PM  ?In your present state of health, do you have any difficulty performing the following activities:  ?Hearing? 0  ?Vision? 0  ?Difficulty concentrating or making decisions? 0  ?Walking or climbing stairs? 1  ?Dressing or bathing? 0  ?Doing errands, shopping? 0  ?Preparing Food and eating ? N  ?Using the Toilet? N  ?In the past six months, have you accidently leaked urine? N  ?Do you have problems with loss of bowel control? N  ?Managing your Medications? N  ?Managing your Finances? N  ?Housekeeping or managing your Housekeeping? N  ? ? ?Patient Care Team: ?CoxFritzi Mandes, MD as PCP - General (Family Medicine) ?Sater, Pearletha Furl, MD  (Neurology) ?Revankar, Aundra Dubin, MD as Consulting Physician (Cardiology) ?Horton Chin, MD as Consulting Physician (Physical Medicine and Rehabilitation) ? ?   ?Assessment:  ? This is a routine wellness examina

## 2021-07-17 ENCOUNTER — Encounter: Payer: Self-pay | Admitting: Family Medicine

## 2021-07-22 ENCOUNTER — Other Ambulatory Visit: Payer: Self-pay | Admitting: Physical Medicine and Rehabilitation

## 2021-07-22 MED ORDER — FLUCONAZOLE 100 MG PO TABS: 100.0000 mg | ORAL_TABLET | Freq: Every day | ORAL | 0 refills | Status: DC

## 2021-07-31 ENCOUNTER — Other Ambulatory Visit: Payer: Self-pay | Admitting: Family Medicine

## 2021-08-01 NOTE — Telephone Encounter (Signed)
Refill sent to pharmacy.   

## 2021-08-02 ENCOUNTER — Encounter: Payer: Self-pay | Admitting: Physical Medicine and Rehabilitation

## 2021-08-02 ENCOUNTER — Encounter: Payer: Self-pay | Admitting: Family Medicine

## 2021-08-03 ENCOUNTER — Other Ambulatory Visit: Payer: Self-pay | Admitting: Family Medicine

## 2021-08-21 ENCOUNTER — Ambulatory Visit: Payer: Medicare Other | Admitting: Family Medicine

## 2021-08-27 ENCOUNTER — Encounter
Payer: Medicare Other | Attending: Physical Medicine and Rehabilitation | Admitting: Physical Medicine and Rehabilitation

## 2021-08-27 DIAGNOSIS — B3749 Other urogenital candidiasis: Secondary | ICD-10-CM | POA: Diagnosis not present

## 2021-08-27 MED ORDER — FLUCONAZOLE 100 MG PO TABS: 100.0000 mg | ORAL_TABLET | Freq: Every day | ORAL | 0 refills | Status: DC
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
## 2021-10-01 ENCOUNTER — Other Ambulatory Visit: Payer: Self-pay | Admitting: Physical Medicine and Rehabilitation

## 2021-10-01 ENCOUNTER — Telehealth: Payer: Self-pay

## 2021-10-01 ENCOUNTER — Other Ambulatory Visit: Payer: Self-pay | Admitting: Family Medicine

## 2021-10-01 ENCOUNTER — Encounter: Payer: Self-pay | Admitting: Physical Medicine and Rehabilitation

## 2021-10-01 MED ORDER — TOPIRAMATE 25 MG PO TABS: 25.0000 mg | ORAL_TABLET | Freq: Every day | ORAL | 3 refills | Status: DC | PRN

## 2021-10-03 ENCOUNTER — Other Ambulatory Visit: Payer: Self-pay | Admitting: Physical Medicine and Rehabilitation

## 2021-10-03 MED ORDER — TOPIRAMATE 25 MG PO TABS: 25.0000 mg | ORAL_TABLET | Freq: Every day | ORAL | 3 refills | Status: DC | PRN

## 2021-10-15 ENCOUNTER — Encounter: Payer: Self-pay | Admitting: Family Medicine

## 2021-10-15 ENCOUNTER — Ambulatory Visit (INDEPENDENT_AMBULATORY_CARE_PROVIDER_SITE_OTHER): Payer: Medicare Other | Admitting: Family Medicine

## 2021-10-15 VITALS — BP 110/70 | HR 88 | Temp 97.2°F | Resp 16 | Ht 65.0 in | Wt 211.0 lb

## 2021-10-15 DIAGNOSIS — E782 Mixed hyperlipidemia: Secondary | ICD-10-CM

## 2021-10-15 DIAGNOSIS — R7303 Prediabetes: Secondary | ICD-10-CM | POA: Diagnosis not present

## 2021-10-15 DIAGNOSIS — M545 Low back pain, unspecified: Secondary | ICD-10-CM | POA: Diagnosis not present

## 2021-10-15 DIAGNOSIS — I10 Essential (primary) hypertension: Secondary | ICD-10-CM

## 2021-10-15 MED ORDER — CYCLOBENZAPRINE HCL 5 MG PO TABS
5.0000 mg | ORAL_TABLET | Freq: Every day | ORAL | 2 refills | Status: DC | PRN
Start: 2021-10-15 — End: 2022-07-14

## 2021-10-15 MED ORDER — MONTELUKAST SODIUM 10 MG PO TABS: ORAL_TABLET | ORAL | 0 refills | Status: DC

## 2021-10-15 NOTE — Progress Notes (Unsigned)
Subjective:  Patient ID: Renee Brady, female    DOB: [birth year]  Age: 46 y.o. MRN: [id number]  Chief Complaint  Patient presents with   Hyperlipidemia    HPI Hyperlipidemia: on lipitor 20 mg daily.   CORONARY ARTERY DISEASE: lipitor,imdur,ntg, aspirin, toprol xl,nifedipine xl.  Pain clinic: put her on topamax 25 mg as needed. Causing mental issues. Pain doctor to take prn. TAKES DICLOFENAC. On Methocarbamol 500 mg 2 twice a day.  On Cyclobenzaprine 5 mg qhs as needed, but ran out. If she takes it, she skips flexeril  Insomnia: melatonin and ambien working.  Multiple sclerosis: Does not see neurology because she does not want to  be on medicines.   Current Outpatient Medications on File Prior to Visit  Medication Sig Dispense Refill   albuterol (VENTOLIN HFA) 108 (90 Base) MCG/ACT inhaler INHALE 2 PUFFS INTO THE LUNGS AS NEEDED FOR WHEEZING OR SHORTNESS OF BREATH. 6.7 each 2   ALPRAZolam (XANAX) 0.5 MG tablet Take 0.5 mg by mouth as needed for anxiety.     amitriptyline (ELAVIL) 50 MG tablet TAKE 1 TABLET BY MOUTH EVERYDAY AT BEDTIME 90 tablet 3   aspirin EC 81 MG tablet Take 1 tablet (81 mg total) by mouth daily. Swallow whole. 90 tablet 3   atorvastatin (LIPITOR) 20 MG tablet Take 1 tablet (20 mg total) by mouth daily. 90 tablet 3   diclofenac (VOLTAREN) 25 MG EC tablet TAKE 1 TABLET (25 MG TOTAL) BY MOUTH DAILY AS NEEDED FOR MILD PAIN. 90 tablet 0   hydrOXYzine (ATARAX) 25 MG tablet TAKE 1 TABLET BY MOUTH 3 TIMES DAILY AS NEEDED FOR ITCHING. 270 tablet 2   ipratropium-albuterol (DUONEB) 0.5-2.5 (3) MG/3ML SOLN TAKE 3 MLS BY NEBULIZATION EVERY 6 (SIX) HOURS AS NEEDED (WHEEZING). 360 mL 0   isosorbide mononitrate (IMDUR) 30 MG 24 hr tablet Take 1 tablet (30 mg total) by mouth daily. 90 tablet 1   Melatonin 10 MG CAPS Take 10 mg by mouth at bedtime.     methocarbamol (ROBAXIN) 500 MG tablet Take 1,000 mg by mouth 2 (two) times daily.     metoprolol succinate (TOPROL XL) 25 MG 24  hr tablet Take 1 tablet (25 mg total) by mouth daily. 90 tablet 3   NIFEdipine (PROCARDIA XL/NIFEDICAL XL) 60 MG 24 hr tablet TAKE 1 TABLET BY MOUTH EVERY DAY 90 tablet 1   nitroGLYCERIN (NITROSTAT) 0.4 MG SL tablet Place 0.4 mg under the tongue every 5 (five) minutes as needed for chest pain.     topiramate (TOPAMAX) 25 MG tablet Take 1 tablet (25 mg total) by mouth daily as needed. 90 tablet 3   triamcinolone cream (KENALOG) 0.1 % Apply 1 application topically 2 (two) times daily. 30 g 0   zolpidem (AMBIEN) 10 MG tablet Take 10 mg by mouth at bedtime.     No current facility-administered medications on file prior to visit.   Past Medical History:  Diagnosis Date   ANA positive 06/26/2014   Formatting of this note might be different from the original. Overview:  Evaluated by Rheumatology Dr. Herma Carson - dx fibromyalgia given.   Angina pectoris (HCC) 05/25/2020   Ankle pain, left 08/27/2014   Formatting of this note might be different from the original. Overview:  Fell on this day with injury to Lt foot and ankle.   Anxiety and depression 04/03/2015   Asthma    CAD (coronary artery disease) 07/03/2020   Cervical radiculitis 10/23/2016   Chronic pain of left knee  09/18/2014   Formatting of this note might be different from the original. Overview:  Evaluated by Ortho.   Dysesthesia 01/26/2019   Epidural fibrosis 06/15/2019   Essential hypertension    Fibromyalgia muscle pain 05/2014   GERD (gastroesophageal reflux disease)    Herniated nucleus pulposus, cervical 10/23/2016   High risk medication use 01/29/2018   Hyperlipidemia    Hypertension    Iron deficiency anemia    Lumbar radiculopathy 01/26/2019   Mixed dyslipidemia 05/25/2020   Multiple sclerosis (HCC) 12/22/2014   Overview:  JC virus antibody positive with index 2.67- checked 12/2014   Nausea 08/23/2018   Numbness and tingling 04/11/2014   Obesity (BMI 35.0-39.9 without comorbidity) 05/25/2020   Other headache syndrome 01/29/2018    Pes anserinus bursitis of right knee 07/24/2014   Formatting of this note might be different from the original. PT, Life-style modification, and NSAID PRN.   Raynaud's disease    Spinal stenosis in cervical region 10/23/2016   Trochanteric bursitis of right hip 07/13/2014   Formatting of this note might be different from the original. Evaluated by Ortho.  Tx with CSI and PT.   Urinary urgency 01/29/2018   Vision abnormalities    Vitamin D deficiency 07/28/2014   Formatting of this note might be different from the original. Daily supplement.   Past Surgical History:  Procedure Laterality Date   ABDOMINAL HYSTERECTOMY     uterus and cervix removed but still has ovaries   BACK SURGERY  2018   lumbar laminectomy/Microdiskectomy    CESAREAN SECTION  x 2   LEFT HEART CATH AND CORONARY ANGIOGRAPHY N/A 06/08/2020   Procedure: LEFT HEART CATH AND CORONARY ANGIOGRAPHY;  Surgeon: Kathleene Hazel, MD;  Location: [facility];  Service: Cardiovascular;  Laterality: N/A;   lumbar laminectomy Left 10/03/2016   TONSILLECTOMY     WISDOM TOOTH EXTRACTION      Family History  Problem Relation Age of Onset   Stroke Mother    Cardiomyopathy Mother    Hypertension Mother    Diabetes Mother    Hypertension Father    ADD / ADHD Father    Depression Father    Diabetes Brother    Social History   Socioeconomic History   Marital status: Married    Spouse name: Not on file   Number of children: 2   Years of education: Not on file   Highest education level: Associate degree: academic program  Occupational History   Not on file  Tobacco Use   Smoking status: Former    Types: Cigarettes    Quit date: 2005    Years since quitting: 18.5   Smokeless tobacco: Never  Vaping Use   Vaping Use: Never used  Substance and Sexual Activity   Alcohol use: No   Drug use: Not Currently    Types: Marijuana   Sexual activity: Yes    Birth control/protection: Surgical  Other Topics Concern    Not on file  Social History Narrative   One son, one daughter   Associates degree in business   Social Determinants of Health   Financial Resource Strain: Not on file  Food Insecurity: Not on file  Transportation Needs: Not on file  Physical Activity: Not on file  Stress: Not on file  Social Connections: Not on file    Review of Systems  Constitutional:  Positive for fatigue. Negative for chills and fever.  HENT:  Negative for congestion, rhinorrhea and sore throat.   Respiratory:  Negative for cough and shortness of breath.   Cardiovascular:  Negative for chest pain.  Gastrointestinal:  Negative for abdominal pain, constipation, diarrhea, nausea and vomiting.  Genitourinary:  Negative for dysuria and urgency.  Musculoskeletal:  Positive for back pain, myalgias and neck pain.  Neurological:  Positive for dizziness, weakness and headaches. Negative for light-headedness.  Psychiatric/Behavioral:  Positive for dysphoric mood. The patient is not nervous/anxious.      Objective:  BP 110/70   Pulse 88   Temp (!) 97.2 F (36.2 C)   Resp 16   Ht 5\' 5"  (1.651 m)   Wt 211 lb (95.7 kg)   BMI 35.11 kg/m      10/15/2021    7:49 AM 06/06/2021    8:18 AM 05/14/2021   10:12 AM  BP/Weight  Systolic BP 110 134 137  Diastolic BP 70 84 94  Wt. (Lbs) 211 216.4 219.4  BMI 35.11 kg/m2 36.01 kg/m2 36.51 kg/m2    Physical Exam Vitals reviewed.  Constitutional:      Appearance: Normal appearance. She is obese.  Neck:     Vascular: No carotid bruit.  Cardiovascular:     Rate and Rhythm: Normal rate and regular rhythm.     Heart sounds: Normal heart sounds.  Pulmonary:     Effort: Pulmonary effort is normal. No respiratory distress.     Breath sounds: Normal breath sounds.  Abdominal:     General: Abdomen is flat. Bowel sounds are normal.     Palpations: Abdomen is soft.     Tenderness: There is no abdominal tenderness.  Neurological:     Mental Status: She is alert and oriented  to person, place, and time.  Psychiatric:        Mood and Affect: Mood normal.        Behavior: Behavior normal.     Diabetic Foot Exam - Simple   No data filed      Lab Results  Component Value Date   WBC 6.6 10/15/2021   HGB 11.9 10/15/2021   HCT 38.5 10/15/2021   PLT 284 10/15/2021   GLUCOSE 120 (H) 10/15/2021   CHOL 118 10/15/2021   TRIG 104 10/15/2021   HDL 45 10/15/2021   LDLCALC 54 10/15/2021   ALT 19 10/15/2021   AST 13 10/15/2021   NA 140 10/15/2021   K 4.1 10/15/2021   CL 104 10/15/2021   CREATININE 0.91 10/15/2021   BUN 12 10/15/2021   CO2 23 10/15/2021   TSH 1.240 06/06/2021   HGBA1C 5.7 (H) 10/15/2021      Assessment & Plan:   Problem List Items Addressed This Visit       Cardiovascular and Mediastinum   Hypertension    Well controlled.  No changes to medicines.  Continue to work on eating a healthy diet and exercise.  Labs drawn today.       Relevant Orders   CBC with Differential/Platelet (Completed)   Comprehensive metabolic panel (Completed)     Other   Mixed dyslipidemia    Well controlled.  No changes to medicines.  Continue to work on eating a healthy diet and exercise.  Labs drawn today.       Relevant Orders   Lipid panel (Completed)   Prediabetes    Hemoglobin A1c 5.7%, 3 month avg of blood sugars, is in prediabetic range.  In order to prevent progression to diabetes, recommend low carb diet and regular exercise      Relevant Orders  Hemoglobin A1c (Completed)   Lumbar back pain - Primary    The current medical regimen is effective;  continue present plan and medications. Currently taking Flexeril 5 mg daily prn      Relevant Medications   cyclobenzaprine (FLEXERIL) 5 MG tablet  .  Meds ordered this encounter  Medications   cyclobenzaprine (FLEXERIL) 5 MG tablet    Sig: Take 1 tablet (5 mg total) by mouth daily as needed for muscle spasms.    Dispense:  30 tablet    Refill:  2   montelukast (SINGULAIR) 10 MG  tablet    Sig: TAKE 1 TABLET BY MOUTH EVERYDAY AT BEDTIME    Dispense:  90 tablet    Refill:  0    Orders Placed This Encounter  Procedures   CBC with Differential/Platelet   Comprehensive metabolic panel   Lipid panel   Hemoglobin A1c   Cardiovascular Risk Assessment    Follow-up: Return in about 3 months (around 01/15/2022) for chronic fasting.  An After Visit Summary was printed and given to the patient.  Blane Ohara, MD [facility] [phone]

## 2021-10-16 LAB — CBC WITH DIFFERENTIAL/PLATELET
Basophils Absolute: 0.1 10*3/uL (ref 0.0–0.2)
Basos: 1 %
EOS (ABSOLUTE): 0.1 10*3/uL (ref 0.0–0.4)
Eos: 1 %
Hematocrit: 38.5 % (ref 34.0–46.6)
Hemoglobin: 11.9 g/dL (ref 11.1–15.9)
Immature Grans (Abs): 0 10*3/uL (ref 0.0–0.1)
Immature Granulocytes: 0 %
Lymphocytes Absolute: 2.6 10*3/uL (ref 0.7–3.1)
Lymphs: 39 %
MCH: 24.7 pg — ABNORMAL LOW (ref 26.6–33.0)
MCHC: 30.9 g/dL — ABNORMAL LOW (ref 31.5–35.7)
MCV: 80 fL (ref 79–97)
Monocytes Absolute: 0.6 10*3/uL (ref 0.1–0.9)
Monocytes: 9 %
Neutrophils Absolute: 3.4 10*3/uL (ref 1.4–7.0)
Neutrophils: 50 %
Platelets: 284 10*3/uL (ref 150–450)
RBC: 4.81 x10E6/uL (ref 3.77–5.28)
RDW: 13.5 % (ref 11.7–15.4)
WBC: 6.6 10*3/uL (ref 3.4–10.8)

## 2021-10-16 LAB — COMPREHENSIVE METABOLIC PANEL
ALT: 19 IU/L (ref 0–32)
AST: 13 IU/L (ref 0–40)
Albumin/Globulin Ratio: 1.9 (ref 1.2–2.2)
Albumin: 4.1 g/dL (ref 3.9–4.9)
Alkaline Phosphatase: 99 IU/L (ref 44–121)
BUN/Creatinine Ratio: 13 (ref 9–23)
BUN: 12 mg/dL (ref 6–24)
Bilirubin Total: 0.7 mg/dL (ref 0.0–1.2)
CO2: 23 mmol/L (ref 20–29)
Calcium: 9.3 mg/dL (ref 8.7–10.2)
Chloride: 104 mmol/L (ref 96–106)
Creatinine, Ser: 0.91 mg/dL (ref 0.57–1.00)
Globulin, Total: 2.2 g/dL (ref 1.5–4.5)
Glucose: 120 mg/dL — ABNORMAL HIGH (ref 70–99)
Potassium: 4.1 mmol/L (ref 3.5–5.2)
Sodium: 140 mmol/L (ref 134–144)
Total Protein: 6.3 g/dL (ref 6.0–8.5)
eGFR: 79 mL/min/{1.73_m2} (ref >59)

## 2021-10-16 LAB — LIPID PANEL
Chol/HDL Ratio: 2.6 ratio (ref 0.0–4.4)
Cholesterol, Total: 118 mg/dL (ref 100–199)
HDL: 45 mg/dL (ref >39)
LDL Chol Calc (NIH): 54 mg/dL (ref 0–99)
Triglycerides: 104 mg/dL (ref 0–149)
VLDL Cholesterol Cal: 19 mg/dL (ref 5–40)

## 2021-10-16 LAB — HEMOGLOBIN A1C
Est. average glucose Bld gHb Est-mCnc: 117 mg/dL
Hgb A1c MFr Bld: 5.7 % — ABNORMAL HIGH (ref 4.8–5.6)

## 2021-10-18 DIAGNOSIS — R7303 Prediabetes: Secondary | ICD-10-CM | POA: Insufficient documentation

## 2021-10-18 DIAGNOSIS — M545 Low back pain, unspecified: Secondary | ICD-10-CM | POA: Insufficient documentation

## 2021-10-18 HISTORY — DX: Prediabetes: R73.03

## 2021-10-18 HISTORY — DX: Low back pain, unspecified: M54.50

## 2021-10-18 NOTE — Assessment & Plan Note (Signed)
Well controlled.  ?No changes to medicines.  ?Continue to work on eating a healthy diet and exercise.  ?Labs drawn today.  ?

## 2021-10-18 NOTE — Assessment & Plan Note (Signed)
Hemoglobin A1c 5.7%, 3 month avg of blood sugars, is in prediabetic range.  In order to prevent progression to diabetes, recommend low carb diet and regular exercise  

## 2021-10-18 NOTE — Assessment & Plan Note (Signed)
The current medical regimen is effective;  continue present plan and medications. Currently taking Flexeril 5 mg daily prn

## 2021-11-09 ENCOUNTER — Other Ambulatory Visit: Payer: Self-pay | Admitting: Family Medicine

## 2021-11-12 ENCOUNTER — Other Ambulatory Visit: Payer: Self-pay | Admitting: Cardiology

## 2021-11-12 ENCOUNTER — Other Ambulatory Visit: Payer: Self-pay | Admitting: Family Medicine

## 2021-11-28 ENCOUNTER — Ambulatory Visit: Payer: Medicare Other | Admitting: Physical Medicine and Rehabilitation

## 2021-11-29 ENCOUNTER — Other Ambulatory Visit: Payer: Self-pay | Admitting: Family Medicine

## 2021-11-29 ENCOUNTER — Telehealth: Payer: Self-pay | Admitting: Cardiology

## 2021-11-29 ENCOUNTER — Other Ambulatory Visit: Payer: Self-pay | Admitting: Cardiology

## 2021-11-29 MED ORDER — METOPROLOL SUCCINATE ER 25 MG PO TB24: 25.0000 mg | ORAL_TABLET | Freq: Every day | ORAL | 2 refills | Status: DC

## 2021-11-29 NOTE — Telephone Encounter (Signed)
Pt c/o medication issue:  1. Name of Medication: metoprolol succinate (TOPROL-XL) 25 MG 24 hr tablet  2. How are you currently taking this medication (dosage and times per day)?   3. Are you having a reaction (difficulty breathing--STAT)?   4. What is your medication issue? Pt was suppose to receive 90 day for this due to insurance, but only 30 was called in. Requesting change.

## 2021-12-02 ENCOUNTER — Ambulatory Visit: Payer: Medicare Other | Admitting: Physical Medicine and Rehabilitation

## 2022-01-01 ENCOUNTER — Other Ambulatory Visit: Payer: Self-pay | Admitting: Family Medicine

## 2022-01-17 ENCOUNTER — Ambulatory Visit (INDEPENDENT_AMBULATORY_CARE_PROVIDER_SITE_OTHER): Payer: Medicare Other | Admitting: Podiatry

## 2022-01-17 ENCOUNTER — Ambulatory Visit (INDEPENDENT_AMBULATORY_CARE_PROVIDER_SITE_OTHER): Payer: Medicare Other

## 2022-01-17 ENCOUNTER — Other Ambulatory Visit: Payer: Self-pay | Admitting: Podiatry

## 2022-01-17 DIAGNOSIS — S93401A Sprain of unspecified ligament of right ankle, initial encounter: Secondary | ICD-10-CM

## 2022-01-17 DIAGNOSIS — M79671 Pain in right foot: Secondary | ICD-10-CM

## 2022-01-17 NOTE — Progress Notes (Unsigned)
Subjective:  Patient ID: Renee Brady, female    DOB: [birth year],  MRN: [id number]  Chief Complaint  Patient presents with   Foot Injury    Pt states she fell down some steps last Tuesday, she landed on her right foot . She states she did have some swelling but that has gone down. She states she is also has a lot of tenderness     46 y.o. female presents acute right ankle and foot pain after and injury earlier this week. Fell down some steps and twisted her foot/ankle. She is presenting for XR of the right ankle and ensure nothing is broken. She has been walking since the injury. Thinks pain is decreasing. Initially had bursing and swelling though this is now decreased.   Past Medical History:  Diagnosis Date   ANA positive 06/26/2014   Formatting of this note might be different from the original. Overview:  Evaluated by Rheumatology Dr. Herma Carson - dx fibromyalgia given.   Angina pectoris (HCC) 05/25/2020   Ankle pain, left 08/27/2014   Formatting of this note might be different from the original. Overview:  Fell on this day with injury to Lt foot and ankle.   Anxiety and depression 04/03/2015   Asthma    CAD (coronary artery disease) 07/03/2020   Cervical radiculitis 10/23/2016   Chronic pain of left knee 09/18/2014   Formatting of this note might be different from the original. Overview:  Evaluated by Ortho.   Dysesthesia 01/26/2019   Epidural fibrosis 06/15/2019   Essential hypertension    Fibromyalgia muscle pain 05/2014   GERD (gastroesophageal reflux disease)    Herniated nucleus pulposus, cervical 10/23/2016   High risk medication use 01/29/2018   Hyperlipidemia    Hypertension    Iron deficiency anemia    Lumbar radiculopathy 01/26/2019   Mixed dyslipidemia 05/25/2020   Multiple sclerosis (HCC) 12/22/2014   Overview:  JC virus antibody positive with index 2.67- checked 12/2014   Nausea 08/23/2018   Numbness and tingling 04/11/2014   Obesity (BMI 35.0-39.9 without  comorbidity) 05/25/2020   Other headache syndrome 01/29/2018   Pes anserinus bursitis of right knee 07/24/2014   Formatting of this note might be different from the original. PT, Life-style modification, and NSAID PRN.   Raynaud's disease    Spinal stenosis in cervical region 10/23/2016   Trochanteric bursitis of right hip 07/13/2014   Formatting of this note might be different from the original. Evaluated by Ortho.  Tx with CSI and PT.   Urinary urgency 01/29/2018   Vision abnormalities    Vitamin D deficiency 07/28/2014   Formatting of this note might be different from the original. Daily supplement.    Allergies  Allergen Reactions   Gabapentin Other (See Comments)    Change in Mental attitude   Latex Itching    Power   Meloxicam Nausea Only   Oxycodone Hcl Itching and Nausea Only   Percocet [Oxycodone-Acetaminophen] Nausea Only   Prozac [Fluoxetine Hcl] Other (See Comments)    Mental Changes   Tecfidera [Dimethyl Fumarate] Diarrhea    Body shut down   Tramadol Itching   Zoloft [Sertraline] Other (See Comments)    Caused Heart Porblems   Buspirone Hcl Palpitations   Hydrochlorothiazide Palpitations   Penicillins Itching and Rash    Reaction: In her 20's    ROS: Negative except as per HPI above  Objective:  General: AAO x3, NAD  Dermatological: With inspection and palpation of the right and left lower extremities there  are no open sores, no preulcerative lesions, no rash or signs of infection present. Nails are of normal length thickness and coloration.   Vascular:  Dorsalis Pedis artery and Posterior Tibial artery pedal pulses are 2/4 bilateral.  Capillary fill time < 3 sec to all digits.   Neruologic: Grossly intact via light touch bilateral. Protective threshold intact to all sites bilateral.   Musculoskeletal: Mild to moderate pain with palpation right ankle especially laterally at the sinus tarsi and course of atfl/cfl. Mild edema of the right ankle. Able to bear  weight. No ecchymosis present.  Gait: Unassisted, Nonantalgic.   No images are attached to the encounter.  Radiographs:  Date: 01/17/22 XR the right foot and ankle Weightbearing AP/Lateral/Oblique   Findings: no fracture, dislocation, swelling or degenerative changes noted Assessment:   1. Moderate right ankle sprain, initial encounter   2. Right foot pain      Plan:  Patient was evaluated and treated and all questions answered.  #Moderate right ankle sprain, initial encounter -Discussed with the patient she likely sustained a right ankle sprain as result of her recent injury - XR as above no evidence of fracture - Recommend immobilization and support of the right ankle with Tri lock ankle brace. Patient agreeable to brace - Offered referral to PT, patient declined - Believe this injury will continue to improve with rest ice elevation and compression in addition to ankle brace RLE.   Return in about 2 months (around 03/19/2022) for follow up right ankle sprain.          Corinna Gab, DPM [facility] / [facility]

## 2022-01-19 NOTE — Progress Notes (Unsigned)
Subjective:  Patient ID: Renee Brady, female    DOB: 1975-05-01  Age: 46 y.o. MRN: [id number]  Chief Complaint  Patient presents with   Hypertension   Hyperlipidemia    HPI Hyperlipidemia: Taking Atorvastatin 20 mg daily  Hypertension: Taking Nifedipine 60 mg daily, Metoprolol 25 mg daily, Isosorbide 30 mg everyday, Aspirin 81 mg daily. Patient sees Dr. Tomie China.  Anxiety: Alprazolam 0.5 mg daily as needed.  Psychiatrist: Corinda Gubler.   Fell while walking down the steps and missed a step and hurt right ankle. Hit her knee, but that is better. Patient saw [facility], Dr. Annamary Rummage.  Recommended RICE therapy and ankle brace. Has diclofenac 25 mg EC daily as needed.   MS: Dr. Epimenio Foot. Has not seen him in a year. Not currently on medicines.   GERD: burping a lot.    Current Outpatient Medications on File Prior to Visit  Medication Sig Dispense Refill   albuterol (VENTOLIN HFA) 108 (90 Base) MCG/ACT inhaler INHALE 2 PUFFS INTO THE LUNGS AS NEEDED FOR WHEEZING OR SHORTNESS OF BREATH. 6.7 each 2   ALPRAZolam (XANAX) 0.5 MG tablet Take 0.5 mg by mouth as needed for anxiety.     amitriptyline (ELAVIL) 50 MG tablet TAKE 1 TABLET BY MOUTH EVERYDAY AT BEDTIME 90 tablet 3   aspirin EC 81 MG tablet Take 1 tablet (81 mg total) by mouth daily. Swallow whole. 90 tablet 3   cyclobenzaprine (FLEXERIL) 5 MG tablet Take 1 tablet (5 mg total) by mouth daily as needed for muscle spasms. 30 tablet 2   diclofenac (VOLTAREN) 25 MG EC tablet TAKE 1 TABLET (25 MG TOTAL) BY MOUTH DAILY AS NEEDED FOR MILD PAIN. 90 tablet 0   hydrOXYzine (ATARAX) 25 MG tablet TAKE 1 TABLET BY MOUTH 3 TIMES DAILY AS NEEDED FOR ITCHING. 270 tablet 2   ipratropium-albuterol (DUONEB) 0.5-2.5 (3) MG/3ML SOLN TAKE 3 MLS BY NEBULIZATION EVERY 6 (SIX) HOURS AS NEEDED (WHEEZING). 360 mL 0   isosorbide mononitrate (IMDUR) 30 MG 24 hr tablet TAKE 1 TABLET BY MOUTH EVERY DAY 90 tablet 1   Melatonin 10 MG CAPS Take 10 mg by mouth at bedtime.      methocarbamol (ROBAXIN) 500 MG tablet TAKE 1 TABLET BY MOUTH EVERY 6 HOURS AS NEEDED FOR MUSCLE SPASMS. 120 tablet 3   metoprolol succinate (TOPROL-XL) 25 MG 24 hr tablet Take 1 tablet (25 mg total) by mouth daily. 90 tablet 2   montelukast (SINGULAIR) 10 MG tablet TAKE 1 TABLET BY MOUTH EVERYDAY AT BEDTIME 90 tablet 0   nitroGLYCERIN (NITROSTAT) 0.4 MG SL tablet Place 0.4 mg under the tongue every 5 (five) minutes as needed for chest pain.     topiramate (TOPAMAX) 25 MG tablet Take 1 tablet (25 mg total) by mouth daily as needed. 90 tablet 3   triamcinolone cream (KENALOG) 0.1 % Apply 1 application topically 2 (two) times daily. 30 g 0   zolpidem (AMBIEN) 10 MG tablet Take 10 mg by mouth at bedtime.     No current facility-administered medications on file prior to visit.   Past Medical History:  Diagnosis Date   Allergic conjunctivitis    ANA positive 06/26/2014   Formatting of this note might be different from the original. Overview:  Evaluated by Rheumatology Dr. Herma Carson - dx fibromyalgia given.   Angina pectoris (HCC) 05/25/2020   Ankle pain, left 08/27/2014   Formatting of this note might be different from the original. Overview:  Fell on this day with injury to [facility]  foot and ankle.   Anxiety and depression 04/03/2015   Asthma    Bipolar 2 disorder, major depressive episode (HCC)    suspected due to being on SSRIs and causing irritability   CAD (coronary artery disease) 07/03/2020   Cervical radiculitis 10/23/2016   Chronic pain of left knee 09/18/2014   Formatting of this note might be different from the original. Overview:  Evaluated by Ortho.   Chronic pain syndrome 02/16/2014   trauma   Dysesthesia 01/26/2019   Epidural fibrosis 06/15/2019   Essential hypertension    Fibromyalgia muscle pain 05/2014   GERD (gastroesophageal reflux disease) 02/2014   Herniated nucleus pulposus, cervical 10/23/2016   High risk medication use 01/29/2018   Hyperlipidemia    Hypertension    Iron  deficiency anemia    Lumbar back pain    Lumbar radiculopathy 01/26/2019   Mixed dyslipidemia 05/25/2020   Multiple sclerosis (HCC) 12/22/2014   Overview:  JC virus antibody positive with index 2.67- checked 12/2014   Muscle spasm    Nausea 08/23/2018   Numbness and tingling 04/11/2014   Obesity (BMI 35.0-39.9 without comorbidity) 05/25/2020   Other headache syndrome 01/29/2018   Pes anserinus bursitis of right knee 07/24/2014   Formatting of this note might be different from the original. PT, Life-style modification, and NSAID PRN.   PTSD (post-traumatic stress disorder)    Raynaud's disease    Raynaud's disease 06/05/2014   RBC microcytosis    Spinal stenosis in cervical region 10/23/2016   Trochanteric bursitis of right hip 07/13/2014   Formatting of this note might be different from the original. Evaluated by Ortho.  Tx with CSI and PT.   Urinary urgency 01/29/2018   Vision abnormalities    Vitamin D deficiency 07/28/2014   Formatting of this note might be different from the original. Daily supplement.   Past Surgical History:  Procedure Laterality Date   ABDOMINAL HYSTERECTOMY     uterus and cervix removed but still has ovaries   BACK SURGERY  2018   lumbar laminectomy/Microdiskectomy    CESAREAN SECTION  x 2   LEFT HEART CATH AND CORONARY ANGIOGRAPHY N/A 06/08/2020   Procedure: LEFT HEART CATH AND CORONARY ANGIOGRAPHY;  Surgeon: Kathleene Hazel, MD;  Location: [facility];  Service: Cardiovascular;  Laterality: N/A;   lumbar laminectomy Left 10/03/2016   TONSILLECTOMY     WISDOM TOOTH EXTRACTION      Family History  Problem Relation Age of Onset   Stroke Mother    Cardiomyopathy Mother    Hypertension Mother    Diabetes Mother    Hypertension Father    ADD / ADHD Father    Depression Father    Diabetes Brother    Social History   Socioeconomic History   Marital status: Married    Spouse name: Not on file   Number of children: 2   Years of  education: Not on file   Highest education level: Associate degree: academic program  Occupational History   Not on file  Tobacco Use   Smoking status: Former    Types: Cigarettes    Quit date: 2005    Years since quitting: 18.8   Smokeless tobacco: Never  Vaping Use   Vaping Use: Never used  Substance and Sexual Activity   Alcohol use: No   Drug use: Not Currently    Types: Marijuana   Sexual activity: Yes    Birth control/protection: Surgical  Other Topics Concern   Not on  file  Social History Narrative   One son, one daughter   Associates degree in business   Social Determinants of Health   Financial Resource Strain: Not on file  Food Insecurity: Not on file  Transportation Needs: Not on file  Physical Activity: Not on file  Stress: Not on file  Social Connections: Not on file    Review of Systems  Constitutional:  Negative for chills, fatigue and fever.  HENT:  Negative for congestion, rhinorrhea and sore throat.   Respiratory:  Negative for cough and shortness of breath.   Cardiovascular:  Negative for chest pain.  Gastrointestinal:  Positive for constipation. Negative for abdominal pain, diarrhea, nausea and vomiting.  Genitourinary:  Negative for dysuria and urgency.  Musculoskeletal:  Positive for arthralgias (right ankle pain). Negative for back pain and myalgias.  Neurological:  Negative for dizziness, weakness, light-headedness and headaches.  Psychiatric/Behavioral:  Negative for dysphoric mood. The patient is not nervous/anxious.      Objective:  BP (!) 104/56   Pulse 96   Temp (!) 97 F (36.1 C)   Resp 16   Ht 5\' 5"  (1.651 m)   Wt 208 lb 6.4 oz (94.5 kg)   BMI 34.68 kg/m      01/20/2022    7:32 AM 10/15/2021    7:49 AM 06/06/2021    8:18 AM  BP/Weight  Systolic BP 104 110 134  Diastolic BP 56 70 84  Wt. (Lbs) 208.4 211 216.4  BMI 34.68 kg/m2 35.11 kg/m2 36.01 kg/m2    Physical Exam Vitals reviewed.  Constitutional:      Appearance:  Normal appearance.  Neck:     Vascular: No carotid bruit.  Cardiovascular:     Rate and Rhythm: Normal rate and regular rhythm.     Heart sounds: Normal heart sounds.  Pulmonary:     Effort: Pulmonary effort is normal. No respiratory distress.     Breath sounds: Normal breath sounds.  Abdominal:     General: Abdomen is flat. Bowel sounds are normal.     Palpations: Abdomen is soft.     Tenderness: There is no abdominal tenderness.  Musculoskeletal:     Comments: Right ankle brace on.  Neurological:     Mental Status: She is alert and oriented to person, place, and time.  Psychiatric:        Mood and Affect: Mood normal.        Behavior: Behavior normal.     Diabetic Foot Exam - Simple   No data filed      Lab Results  Component Value Date   WBC 6.9 01/20/2022   HGB 12.7 01/20/2022   HCT 41.1 01/20/2022   PLT 317 01/20/2022   GLUCOSE 146 (H) 01/20/2022   CHOL 138 01/20/2022   TRIG 77 01/20/2022   HDL 56 01/20/2022   LDLCALC 67 01/20/2022   ALT 11 01/20/2022   AST 14 01/20/2022   NA 136 01/20/2022   K 4.7 01/20/2022   CL 98 01/20/2022   CREATININE 1.09 (H) 01/20/2022   BUN 16 01/20/2022   CO2 24 01/20/2022   TSH 1.240 06/06/2021   HGBA1C 5.8 (H) 01/20/2022      Assessment & Plan:   Problem List Items Addressed This Visit       Cardiovascular and Mediastinum   Essential hypertension - Primary    Decrease nifedipine er 30 mg daily  Check bp at home.         Relevant Orders  Comprehensive metabolic panel (Completed)   CBC with Differential/Platelet (Completed)   CAD (coronary artery disease)    Management per specialist. The current medical regimen is effective;  continue present plan and medications. Taking Atorvastatin 20 mg daily, aspirin 81 mg daily.        Digestive   GERD (gastroesophageal reflux disease)    Recommend pepcid 20 mg one daily.         Nervous and Auditory   Multiple sclerosis (HCC)    Dr. Epimenio Foot. Has not seen him in a  year. Not currently on medicines.        Other   Anxiety and depression    The current medical regimen is effective;  continue present plan and medications. Management per specialist. Alprazolam 0.5 mg daily as needed.       Vitamin D deficiency    Check labs.      Relevant Orders   VITAMIN D 25 Hydroxy (Vit-D Deficiency, Fractures) (Completed)   Mixed dyslipidemia    Well controlled.  No changes to medicines. Taking Atorvastatin 20 mg daily. Continue to work on eating a healthy diet and exercise.  Labs drawn today.       Relevant Orders   Lipid panel (Completed)   Prediabetes    Hemoglobin A1c 5.8%, 3 month avg of blood sugars, is in prediabetic range.  In order to prevent progression to diabetes, recommend low carb diet and regular exercise      Relevant Orders   Hemoglobin A1c (Completed)   Other Visit Diagnoses     Visit for screening mammogram       Relevant Orders   MM DIGITAL SCREENING BILATERAL     .  No orders of the defined types were placed in this encounter.   Orders Placed This Encounter  Procedures   MM DIGITAL SCREENING BILATERAL   Comprehensive metabolic panel   Hemoglobin A1c   Lipid panel   CBC with Differential/Platelet   VITAMIN D 25 Hydroxy (Vit-D Deficiency, Fractures)   Cardiovascular Risk Assessment   Total time spent on today's visit was greater than 30 minutes, including both face-to-face time and nonface-to-face time personally spent on review of chart (labs and imaging), discussing labs and goals, discussing further work-up, treatment options, referrals to specialist if needed, reviewing outside records of pertinent, answering patient's questions, and coordinating care.   Follow-up: Return in about 4 months (around 05/23/2022) for chronic fasting.  Milana Na Borjas,acting as a Neurosurgeon for Blane Ohara, MD.,have documented all relevant documentation on the behalf of Blane Ohara, MD,as directed by  Blane Ohara, MD while in the  presence of Blane Ohara, MD.   An After Visit Summary was printed and given to the patient.  Blane Ohara, MD [facility] [phone]

## 2022-01-20 ENCOUNTER — Ambulatory Visit (INDEPENDENT_AMBULATORY_CARE_PROVIDER_SITE_OTHER): Payer: Medicare Other | Admitting: Family Medicine

## 2022-01-20 ENCOUNTER — Encounter: Payer: Self-pay | Admitting: Family Medicine

## 2022-01-20 VITALS — BP 104/56 | HR 96 | Temp 97.0°F | Resp 16 | Ht 65.0 in | Wt 208.4 lb

## 2022-01-20 DIAGNOSIS — I2510 Atherosclerotic heart disease of native coronary artery without angina pectoris: Secondary | ICD-10-CM

## 2022-01-20 DIAGNOSIS — Z1231 Encounter for screening mammogram for malignant neoplasm of breast: Secondary | ICD-10-CM

## 2022-01-20 DIAGNOSIS — G35 Multiple sclerosis: Secondary | ICD-10-CM

## 2022-01-20 DIAGNOSIS — K219 Gastro-esophageal reflux disease without esophagitis: Secondary | ICD-10-CM | POA: Diagnosis not present

## 2022-01-20 DIAGNOSIS — I10 Essential (primary) hypertension: Secondary | ICD-10-CM

## 2022-01-20 DIAGNOSIS — E782 Mixed hyperlipidemia: Secondary | ICD-10-CM

## 2022-01-20 DIAGNOSIS — E559 Vitamin D deficiency, unspecified: Secondary | ICD-10-CM

## 2022-01-20 DIAGNOSIS — R7303 Prediabetes: Secondary | ICD-10-CM

## 2022-01-20 DIAGNOSIS — F419 Anxiety disorder, unspecified: Secondary | ICD-10-CM

## 2022-01-20 DIAGNOSIS — F32A Depression, unspecified: Secondary | ICD-10-CM

## 2022-01-20 NOTE — Patient Instructions (Addendum)
Decrease nifedipine er 30 mg daily  Check bp at home.   GERD: recommend pepcid 20 mg one daily.

## 2022-01-21 ENCOUNTER — Other Ambulatory Visit: Payer: Self-pay | Admitting: Family Medicine

## 2022-01-21 ENCOUNTER — Encounter: Payer: Self-pay | Admitting: Family Medicine

## 2022-01-21 ENCOUNTER — Other Ambulatory Visit: Payer: Self-pay | Admitting: Cardiology

## 2022-01-21 DIAGNOSIS — E782 Mixed hyperlipidemia: Secondary | ICD-10-CM

## 2022-01-21 LAB — CBC WITH DIFFERENTIAL/PLATELET
Basophils Absolute: 0.1 10*3/uL (ref 0.0–0.2)
Basos: 1 %
EOS (ABSOLUTE): 0.1 10*3/uL (ref 0.0–0.4)
Eos: 2 %
Hematocrit: 41.1 % (ref 34.0–46.6)
Hemoglobin: 12.7 g/dL (ref 11.1–15.9)
Immature Grans (Abs): 0 10*3/uL (ref 0.0–0.1)
Immature Granulocytes: 0 %
Lymphocytes Absolute: 2.6 10*3/uL (ref 0.7–3.1)
Lymphs: 38 %
MCH: 24.7 pg — ABNORMAL LOW (ref 26.6–33.0)
MCHC: 30.9 g/dL — ABNORMAL LOW (ref 31.5–35.7)
MCV: 80 fL (ref 79–97)
Monocytes Absolute: 0.6 10*3/uL (ref 0.1–0.9)
Monocytes: 9 %
Neutrophils Absolute: 3.4 10*3/uL (ref 1.4–7.0)
Neutrophils: 50 %
Platelets: 317 10*3/uL (ref 150–450)
RBC: 5.15 x10E6/uL (ref 3.77–5.28)
RDW: 13.9 % (ref 11.7–15.4)
WBC: 6.9 10*3/uL (ref 3.4–10.8)

## 2022-01-21 LAB — LIPID PANEL
Chol/HDL Ratio: 2.5 ratio (ref 0.0–4.4)
Cholesterol, Total: 138 mg/dL (ref 100–199)
HDL: 56 mg/dL (ref >39)
LDL Chol Calc (NIH): 67 mg/dL (ref 0–99)
Triglycerides: 77 mg/dL (ref 0–149)
VLDL Cholesterol Cal: 15 mg/dL (ref 5–40)

## 2022-01-21 LAB — COMPREHENSIVE METABOLIC PANEL
ALT: 11 IU/L (ref 0–32)
AST: 14 IU/L (ref 0–40)
Albumin/Globulin Ratio: 1.7 (ref 1.2–2.2)
Albumin: 4.5 g/dL (ref 3.9–4.9)
Alkaline Phosphatase: 120 IU/L (ref 44–121)
BUN/Creatinine Ratio: 15 (ref 9–23)
BUN: 16 mg/dL (ref 6–24)
Bilirubin Total: 0.6 mg/dL (ref 0.0–1.2)
CO2: 24 mmol/L (ref 20–29)
Calcium: 9.6 mg/dL (ref 8.7–10.2)
Chloride: 98 mmol/L (ref 96–106)
Creatinine, Ser: 1.09 mg/dL — ABNORMAL HIGH (ref 0.57–1.00)
Globulin, Total: 2.7 g/dL (ref 1.5–4.5)
Glucose: 146 mg/dL — ABNORMAL HIGH (ref 70–99)
Potassium: 4.7 mmol/L (ref 3.5–5.2)
Sodium: 136 mmol/L (ref 134–144)
Total Protein: 7.2 g/dL (ref 6.0–8.5)
eGFR: 63 mL/min/{1.73_m2} (ref >59)

## 2022-01-21 LAB — VITAMIN D 25 HYDROXY (VIT D DEFICIENCY, FRACTURES): Vit D, 25-Hydroxy: 45.6 ng/mL (ref 30.0–100.0)

## 2022-01-21 LAB — CARDIOVASCULAR RISK ASSESSMENT

## 2022-01-21 LAB — HEMOGLOBIN A1C
Est. average glucose Bld gHb Est-mCnc: 120 mg/dL
Hgb A1c MFr Bld: 5.8 % — ABNORMAL HIGH (ref 4.8–5.6)

## 2022-01-21 MED ORDER — NIFEDIPINE ER OSMOTIC RELEASE 30 MG PO TB24: 30.0000 mg | ORAL_TABLET | Freq: Every day | ORAL | 0 refills | Status: DC

## 2022-01-21 MED ORDER — NIFEDIPINE ER OSMOTIC RELEASE 30 MG PO TB24: 30.0000 mg | ORAL_TABLET | Freq: Every day | ORAL | 2 refills | Status: DC

## 2022-01-21 NOTE — Progress Notes (Signed)
Blood count normal.  Liver function normal.  Kidney function slightly abnormal.  Cholesterol: good HBA1C: 5.8. Vitamin D normal

## 2022-01-22 ENCOUNTER — Encounter: Payer: Self-pay | Admitting: Physical Medicine and Rehabilitation

## 2022-01-22 NOTE — Telephone Encounter (Signed)
Rx refill sent to pharmacy. 

## 2022-01-26 NOTE — Assessment & Plan Note (Signed)
Dr. Felecia Shelling. Has not seen him in a year. Not currently on medicines.

## 2022-01-26 NOTE — Assessment & Plan Note (Addendum)
Well controlled.  No changes to medicines. Taking Atorvastatin 20 mg daily. Continue to work on eating a healthy diet and exercise.  Labs drawn today.   

## 2022-01-26 NOTE — Assessment & Plan Note (Signed)
Management per specialist. The current medical regimen is effective;  continue present plan and medications. Taking Atorvastatin 20 mg daily, aspirin 81 mg daily.

## 2022-01-26 NOTE — Assessment & Plan Note (Addendum)
Hemoglobin A1c 5.8%, 3 month avg of blood sugars, is in prediabetic range.  In order to prevent progression to diabetes, recommend low carb diet and regular exercise  

## 2022-01-26 NOTE — Assessment & Plan Note (Signed)
Decrease nifedipine er 30 mg daily  Check bp at home.

## 2022-01-26 NOTE — Assessment & Plan Note (Signed)
Check labs 

## 2022-01-26 NOTE — Assessment & Plan Note (Signed)
>>  ASSESSMENT AND PLAN FOR ESSENTIAL HYPERTENSION WRITTEN ON 01/26/2022  9:37 AM BY LEAL-BORJAS, MARLA I, CMA  Decrease nifedipine er 30 mg daily  Check bp at home.

## 2022-01-26 NOTE — Assessment & Plan Note (Signed)
Recommend pepcid 20 mg one daily.

## 2022-01-26 NOTE — Assessment & Plan Note (Signed)
The current medical regimen is effective;  continue present plan and medications. Management per specialist. Alprazolam 0.5 mg daily as needed.

## 2022-01-28 ENCOUNTER — Encounter: Payer: Self-pay | Admitting: Family Medicine

## 2022-02-11 ENCOUNTER — Ambulatory Visit (INDEPENDENT_AMBULATORY_CARE_PROVIDER_SITE_OTHER): Payer: Medicare Other | Admitting: Neurology

## 2022-02-11 ENCOUNTER — Telehealth: Payer: Self-pay | Admitting: Neurology

## 2022-02-11 ENCOUNTER — Encounter: Payer: Self-pay | Admitting: Neurology

## 2022-02-11 VITALS — BP 135/93 | HR 81 | Ht 65.0 in | Wt 213.0 lb

## 2022-02-11 DIAGNOSIS — R269 Unspecified abnormalities of gait and mobility: Secondary | ICD-10-CM | POA: Insufficient documentation

## 2022-02-11 DIAGNOSIS — R110 Nausea: Secondary | ICD-10-CM | POA: Diagnosis not present

## 2022-02-11 DIAGNOSIS — G35 Multiple sclerosis: Secondary | ICD-10-CM | POA: Diagnosis not present

## 2022-02-11 DIAGNOSIS — I2510 Atherosclerotic heart disease of native coronary artery without angina pectoris: Secondary | ICD-10-CM | POA: Diagnosis not present

## 2022-02-11 DIAGNOSIS — G96198 Other disorders of meninges, not elsewhere classified: Secondary | ICD-10-CM

## 2022-02-11 DIAGNOSIS — R3915 Urgency of urination: Secondary | ICD-10-CM

## 2022-02-11 HISTORY — DX: Unspecified abnormalities of gait and mobility: R26.9

## 2022-02-11 MED ORDER — TROSPIUM CHLORIDE 20 MG PO TABS: 20.0000 mg | ORAL_TABLET | Freq: Two times a day (BID) | ORAL | 11 refills | Status: DC

## 2022-02-11 MED ORDER — ONDANSETRON HCL 4 MG PO TABS: 4.0000 mg | ORAL_TABLET | Freq: Every day | ORAL | 2 refills | Status: DC | PRN

## 2022-02-11 NOTE — Telephone Encounter (Signed)
[employer] sent to GI they obtain Renee Brady [facility] [phone]
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
## 2022-02-25 ENCOUNTER — Other Ambulatory Visit: Payer: Self-pay | Admitting: *Deleted

## 2022-02-25 ENCOUNTER — Encounter: Payer: Self-pay | Admitting: Neurology

## 2022-02-25 MED ORDER — METHYLPREDNISOLONE 4 MG PO TBPK: ORAL_TABLET | ORAL | 0 refills | Status: DC

## 2022-02-27 ENCOUNTER — Other Ambulatory Visit: Payer: Self-pay | Admitting: Family Medicine

## 2022-03-02 ENCOUNTER — Other Ambulatory Visit: Payer: Medicare Other

## 2022-03-02 ENCOUNTER — Other Ambulatory Visit: Payer: Self-pay | Admitting: Family Medicine

## 2022-03-02 MED ORDER — MONTELUKAST SODIUM 10 MG PO TABS: ORAL_TABLET | ORAL | 3 refills | Status: DC

## 2022-03-03 ENCOUNTER — Other Ambulatory Visit: Payer: Self-pay

## 2022-03-03 MED ORDER — MONTELUKAST SODIUM 10 MG PO TABS: ORAL_TABLET | ORAL | 0 refills | Status: DC

## 2022-03-05 ENCOUNTER — Ambulatory Visit
Admission: RE | Admit: 2022-03-05 | Discharge: 2022-03-05 | Disposition: A | Discharge status: Home / Self Care | Payer: Medicare Other | Source: Ambulatory Visit | Attending: Family Medicine | Admitting: Family Medicine

## 2022-03-05 DIAGNOSIS — Z1231 Encounter for screening mammogram for malignant neoplasm of breast: Secondary | ICD-10-CM

## 2022-03-13 ENCOUNTER — Encounter: Payer: Self-pay | Admitting: Neurology

## 2022-03-14 ENCOUNTER — Ambulatory Visit: Payer: Medicare Other | Admitting: Podiatry

## 2022-04-02 ENCOUNTER — Ambulatory Visit
Admission: RE | Admit: 2022-04-02 | Discharge: 2022-04-02 | Disposition: A | Discharge status: Home / Self Care | Payer: Medicare Other | Source: Ambulatory Visit | Attending: Neurology | Admitting: Neurology

## 2022-04-02 DIAGNOSIS — R269 Unspecified abnormalities of gait and mobility: Secondary | ICD-10-CM

## 2022-04-02 DIAGNOSIS — G35 Multiple sclerosis: Secondary | ICD-10-CM

## 2022-04-02 MED ORDER — GADOPICLENOL 0.5 MMOL/ML IV SOLN
10.0000 mL | Freq: Once | INTRAVENOUS | Status: AC | PRN
  Administered 2022-04-02: 10 mL via INTRAVENOUS

## 2022-04-10 ENCOUNTER — Ambulatory Visit (INDEPENDENT_AMBULATORY_CARE_PROVIDER_SITE_OTHER): Payer: Medicare Other | Admitting: Family Medicine

## 2022-04-10 ENCOUNTER — Encounter: Payer: Self-pay | Admitting: Family Medicine

## 2022-04-10 VITALS — BP 124/78 | HR 76 | Temp 97.2°F | Resp 14 | Ht 65.0 in | Wt 210.0 lb

## 2022-04-10 DIAGNOSIS — R222 Localized swelling, mass and lump, trunk: Secondary | ICD-10-CM | POA: Diagnosis not present

## 2022-04-10 DIAGNOSIS — F3181 Bipolar II disorder: Secondary | ICD-10-CM

## 2022-04-10 DIAGNOSIS — F4310 Post-traumatic stress disorder, unspecified: Secondary | ICD-10-CM | POA: Diagnosis not present

## 2022-04-10 DIAGNOSIS — G35 Multiple sclerosis: Secondary | ICD-10-CM

## 2022-04-10 DIAGNOSIS — I25118 Atherosclerotic heart disease of native coronary artery with other forms of angina pectoris: Secondary | ICD-10-CM

## 2022-04-10 MED ORDER — MONTELUKAST SODIUM 10 MG PO TABS: ORAL_TABLET | ORAL | 3 refills | Status: DC

## 2022-04-10 MED ORDER — NIFEDIPINE ER OSMOTIC RELEASE 30 MG PO TB24: 30.0000 mg | ORAL_TABLET | Freq: Every day | ORAL | 1 refills | Status: DC

## 2022-04-10 NOTE — Progress Notes (Unsigned)
Acute Office Visit  Subjective:    Patient ID: Renee Brady, female    DOB: [birth year], 47 y.o.   MRN: [id number]  Chief Complaint  Patient presents with   chest nodule    HPI: Patient is in today for bb size nodule right chest.  Mammogram was done 03/05/2022 and was normal. Nontender.   MS: Saw Dr. Epimenio Foot. 3 new lesions in brain. Patient is having some diffuse symptoms and it is unclear if these are related to the 3 new lesions. She is having shoulder discomfort, right arm pain and weakness. It hurts to write or type. Dr. Epimenio Foot has recommended a new MS medicine. Mrs.Troendle wishes to see her cardiologist prior to starting any new MS medicines.   Patient has had a difficult few months as her husband developed cardiac issues. He has improved.  CORONARY ARTERY DISEASE with Angina, hypertension, and hyperlipidemia:  Patient reported chest pain after leaving the office via my chart.  She is currently on imdur 30mg  daily, aspirin 81 mg daily, lipitor 20 mg before bed, toprol xl 25 mg daily, and procardia xl 30 mg daily. She has ntg.   LHC 06/2020 1st Diag lesion is 80% stenosed. Prox LAD lesion is 30% stenosed. The left ventricular systolic function is normal. LV end diastolic pressure is normal. The left ventricular ejection fraction is 55-65% by visual estimate. There is no mitral valve regurgitation.   1. No left main disease 2. Mild mid LAD stenosis at the takeoff of the small first diagonal branch. The diagonal branch is small in caliber and has diffuse severe stenosis from the ostium through the mid segment.  3. No disease noted in the large caliber Circumflex artery which supplies a large obtuse marginal branch 4. The RCA is a large dominant vessel with no obstructive disease.    Recommendations: Her major epicardial vessels have no obstructive disease. The small caliber diagonal branch that supplies a small segment of myocardium has severe, diffuse ostial/proximal and mid  disease. This vessel is too small for PCI. Medical management of CAD.  I think she would benefit from anti-anginal therapy. We will add Imdur 30 mg daily. If she does not tolerate this, I would consider adding Ranexa.    Past Medical History:  Diagnosis Date   Allergic conjunctivitis    ANA positive 06/26/2014   Formatting of this note might be different from the original. Overview:  Evaluated by Rheumatology Dr. Herma Carson - dx fibromyalgia given.   Angina pectoris (HCC) 05/25/2020   Ankle pain, left 08/27/2014   Formatting of this note might be different from the original. Overview:  Fell on this day with injury to Lt foot and ankle.   Anxiety and depression 04/03/2015   Asthma    Bipolar 2 disorder, major depressive episode (HCC)    suspected due to being on SSRIs and causing irritability   CAD (coronary artery disease) 07/03/2020   Cervical radiculitis 10/23/2016   Chronic pain of left knee 09/18/2014   Formatting of this note might be different from the original. Overview:  Evaluated by Ortho.   Chronic pain syndrome 02/16/2014   trauma   Dysesthesia 01/26/2019   Epidural fibrosis 06/15/2019   Essential hypertension    Fibromyalgia muscle pain 05/2014   Gait disturbance 02/11/2022   GERD (gastroesophageal reflux disease) 02/2014   Herniated nucleus pulposus, cervical 10/23/2016   High risk medication use 01/29/2018   Hyperlipidemia    Hypertension    Iron deficiency anemia    Lumbar  back pain    Lumbar radiculopathy 01/26/2019   Mixed dyslipidemia 05/25/2020   Multiple sclerosis (HCC) 12/22/2014   Overview:  JC virus antibody positive with index 2.67- checked 12/2014   Muscle spasm    Nausea 08/23/2018   Numbness and tingling 04/11/2014   Obesity (BMI 35.0-39.9 without comorbidity) 05/25/2020   Other headache syndrome 01/29/2018   Pes anserinus bursitis of right knee 07/24/2014   Formatting of this note might be different from the original. PT, Life-style modification, and NSAID  PRN.   Prediabetes 10/18/2021   PTSD (post-traumatic stress disorder)    Raynaud's disease    Raynaud's disease 06/05/2014   RBC microcytosis    Spinal stenosis in cervical region 10/23/2016   Trochanteric bursitis of right hip 07/13/2014   Formatting of this note might be different from the original. Evaluated by Ortho.  Tx with CSI and PT.   Urinary urgency 01/29/2018   Vision abnormalities    Vitamin D deficiency 07/28/2014   Formatting of this note might be different from the original. Daily supplement.    Past Surgical History:  Procedure Laterality Date   ABDOMINAL HYSTERECTOMY     uterus and cervix removed but still has ovaries   CESAREAN SECTION  x 2   LEFT HEART CATH AND CORONARY ANGIOGRAPHY N/A 06/08/2020   Procedure: LEFT HEART CATH AND CORONARY ANGIOGRAPHY;  Surgeon: Kathleene Hazel, MD;  Location: [facility];  Service: Cardiovascular;  Laterality: N/A;   lumbar laminectomy Left 11/02/2016   TONSILLECTOMY     WISDOM TOOTH EXTRACTION      Family History  Problem Relation Age of Onset   Stroke Mother    Cardiomyopathy Mother    Hypertension Mother    Diabetes Mother    Hypertension Father    ADD / ADHD Father    Depression Father    Kidney disease Father        Stage 3   Diabetes Brother     Social History   Socioeconomic History   Marital status: Married    Spouse name: Not on file   Number of children: 2   Years of education: Not on file   Highest education level: Associate degree: academic program  Occupational History   Not on file  Tobacco Use   Smoking status: Former    Types: Cigarettes    Quit date: 2005    Years since quitting: 19.0   Smokeless tobacco: Never  Vaping Use   Vaping Use: Never used  Substance and Sexual Activity   Alcohol use: No   Drug use: Not Currently    Types: Marijuana   Sexual activity: Yes    Birth control/protection: Surgical  Other Topics Concern   Not on file  Social History Narrative   One  son, one daughter   Associates degree in business   Social Determinants of Health   Financial Resource Strain: Not on file  Food Insecurity: Not on file  Transportation Needs: Not on file  Physical Activity: Not on file  Stress: Not on file  Social Connections: Not on file  Intimate Partner Violence: Not on file    Outpatient Medications Prior to Visit  Medication Sig Dispense Refill   methylPREDNISolone (MEDROL DOSEPAK) 4 MG TBPK tablet Take 6 tablets on day 1 and decrease by 1 tablet each day until finished 21 tablet 0   albuterol (VENTOLIN HFA) 108 (90 Base) MCG/ACT inhaler INHALE 2 PUFFS INTO THE LUNGS AS NEEDED FOR WHEEZING OR SHORTNESS  OF BREATH. 6.7 each 2   ALPRAZolam (XANAX) 0.5 MG tablet Take 0.5 mg by mouth as needed for anxiety.     amitriptyline (ELAVIL) 50 MG tablet TAKE 1 TABLET BY MOUTH EVERYDAY AT BEDTIME 90 tablet 3   aspirin EC 81 MG tablet Take 1 tablet (81 mg total) by mouth daily. Swallow whole. 90 tablet 3   atorvastatin (LIPITOR) 20 MG tablet TAKE 1 TABLET BY MOUTH EVERY DAY 90 tablet 1   cyclobenzaprine (FLEXERIL) 5 MG tablet Take 1 tablet (5 mg total) by mouth daily as needed for muscle spasms. 30 tablet 2   diclofenac (VOLTAREN) 25 MG EC tablet TAKE 1 TABLET (25 MG TOTAL) BY MOUTH DAILY AS NEEDED FOR MILD PAIN. 90 tablet 0   hydrOXYzine (ATARAX) 25 MG tablet TAKE 1 TABLET BY MOUTH 3 TIMES DAILY AS NEEDED FOR ITCHING. 270 tablet 2   ipratropium-albuterol (DUONEB) 0.5-2.5 (3) MG/3ML SOLN TAKE 3 MLS BY NEBULIZATION EVERY 6 (SIX) HOURS AS NEEDED (WHEEZING). 360 mL 0   isosorbide mononitrate (IMDUR) 30 MG 24 hr tablet TAKE 1 TABLET BY MOUTH EVERY DAY 90 tablet 1   methocarbamol (ROBAXIN) 500 MG tablet TAKE 1 TABLET BY MOUTH EVERY 6 HOURS AS NEEDED FOR MUSCLE SPASMS. 120 tablet 3   metoprolol succinate (TOPROL-XL) 25 MG 24 hr tablet Take 1 tablet (25 mg total) by mouth daily. 90 tablet 2   nitroGLYCERIN (NITROSTAT) 0.4 MG SL tablet Place 0.4 mg under the tongue every  5 (five) minutes as needed for chest pain.     ondansetron (ZOFRAN) 4 MG tablet Take 1 tablet (4 mg total) by mouth daily as needed for nausea or vomiting. 30 tablet 2   topiramate (TOPAMAX) 25 MG tablet Take 1 tablet (25 mg total) by mouth daily as needed. 90 tablet 3   triamcinolone cream (KENALOG) 0.1 % Apply 1 application topically 2 (two) times daily. 30 g 0   trospium (SANCTURA) 20 MG tablet Take 1 tablet (20 mg total) by mouth 2 (two) times daily. 60 tablet 11   Melatonin 10 MG CAPS Take 10 mg by mouth at bedtime.     montelukast (SINGULAIR) 10 MG tablet TAKE 1 TABLET BY MOUTH EVERYDAY AT BEDTIME 90 tablet 0   NIFEdipine (PROCARDIA-XL/NIFEDICAL-XL) 30 MG 24 hr tablet Take 1 tablet (30 mg total) by mouth daily. 90 tablet 0   zolpidem (AMBIEN) 10 MG tablet Take 10 mg by mouth at bedtime.     No facility-administered medications prior to visit.    Allergies  Allergen Reactions   Gabapentin Other (See Comments)    Change in Mental attitude   Latex Itching    Power   Meloxicam Nausea Only   Oxycodone Hcl Itching and Nausea Only   Percocet [Oxycodone-Acetaminophen] Nausea Only   Prozac [Fluoxetine Hcl] Other (See Comments)    Mental Changes   Tecfidera [Dimethyl Fumarate] Diarrhea    Body shut down   Tramadol Itching   Zoloft [Sertraline] Other (See Comments)    Caused Heart Porblems   Buspirone Hcl Palpitations   Hydrochlorothiazide Palpitations   Penicillins Itching and Rash    Reaction: In her 20's    Review of Systems  Constitutional:  Positive for fatigue. Negative for chills and fever.  HENT:  Negative for congestion, ear pain and sore throat.   Respiratory:  Negative for cough and shortness of breath.   Cardiovascular:  Positive for chest pain (pt messaged me about this after her appt.).  Gastrointestinal:  Negative for abdominal  pain, constipation, diarrhea, nausea and vomiting.  Musculoskeletal:  Positive for arthralgias (bilateral knee pain and hip pain) and  myalgias.  Neurological:  Negative for weakness.  Psychiatric/Behavioral:  Negative for dysphoric mood. The patient is not nervous/anxious.        Objective:    Physical Exam Vitals reviewed.  Constitutional:      Appearance: Normal appearance. She is obese.  Neck:     Vascular: No carotid bruit.  Cardiovascular:     Rate and Rhythm: Normal rate and regular rhythm.     Heart sounds: Normal heart sounds.  Pulmonary:     Effort: Pulmonary effort is normal. No respiratory distress.     Breath sounds: Normal breath sounds.  Abdominal:     General: Abdomen is flat. Bowel sounds are normal.     Palpations: Abdomen is soft.     Tenderness: There is no abdominal tenderness.  Musculoskeletal:        General: Tenderness (FM trigger points of posterior neck and trapezius muscles.) present.     Comments: Right shoulder discomfort with movement.  Skin:    Findings: Lesion (rt anterior chest wall: approximately 1 cm size irregular lesion. nontender. somewhat mobile.) present.  Neurological:     Mental Status: She is alert and oriented to person, place, and time.  Psychiatric:        Behavior: Behavior normal.     Comments: anxious     BP 124/78   Pulse 76   Temp (!) 97.2 F (36.2 C)   Resp 14   Ht 5\' 5"  (1.651 m)   Wt 210 lb (95.3 kg)   BMI 34.95 kg/m  Wt Readings from Last 3 Encounters:  04/10/22 210 lb (95.3 kg)  02/11/22 213 lb (96.6 kg)  01/20/22 208 lb 6.4 oz (94.5 kg)    Health Maintenance Due  Topic Date Due   COLONOSCOPY (Pts 45-61yrs Insurance coverage will need to be confirmed)  Never done   INFLUENZA VACCINE  11/05/2021   COVID-19 Vaccine (5 - 2023-24 season) 12/06/2021    There are no preventive care reminders to display for this patient.   Lab Results  Component Value Date   TSH 1.240 06/06/2021   Lab Results  Component Value Date   WBC 6.9 01/20/2022   HGB 12.7 01/20/2022   HCT 41.1 01/20/2022   MCV 80 01/20/2022   PLT 317 01/20/2022   Lab  Results  Component Value Date   NA 136 01/20/2022   K 4.7 01/20/2022   CO2 24 01/20/2022   GLUCOSE 146 (H) 01/20/2022   BUN 16 01/20/2022   CREATININE 1.09 (H) 01/20/2022   BILITOT 0.6 01/20/2022   ALKPHOS 120 01/20/2022   AST 14 01/20/2022   ALT 11 01/20/2022   PROT 7.2 01/20/2022   ALBUMIN 4.5 01/20/2022   CALCIUM 9.6 01/20/2022   EGFR 63 01/20/2022   Lab Results  Component Value Date   CHOL 138 01/20/2022   Lab Results  Component Value Date   HDL 56 01/20/2022   Lab Results  Component Value Date   LDLCALC 67 01/20/2022   Lab Results  Component Value Date   TRIG 77 01/20/2022   Lab Results  Component Value Date   CHOLHDL 2.5 01/20/2022   Lab Results  Component Value Date   HGBA1C 5.8 (H) 01/20/2022       Assessment & Plan:   Problem List Items Addressed This Visit       Cardiovascular and Mediastinum   Coronary  artery disease with stable angina pectoris [facility])    Following receiving the patient's my chart message.  I recommended patient call cardiology on Monday and get an appt.  I also recommended if chest pain recurs and does not resolve (with rest or ntg), recommend Call EMS.      Relevant Medications   NIFEdipine (PROCARDIA-XL/NIFEDICAL-XL) 30 MG 24 hr tablet     Nervous and Auditory   Multiple sclerosis (HCC)    Defer management to neurology.  I recommend she follow up soon to start the new MS medicine. Many of her symptoms may be related to her MS.        Other   Bipolar 2 disorder, major depressive episode (HCC)    Management per specialist.   Recommended continue current medications and counseling.       PTSD (post-traumatic stress disorder)    Follow up with psychiatry.       Subcutaneous nodule of chest wall - Primary    Refer to general surgery for excision.      Relevant Orders   Ambulatory referral to General Surgery   Meds ordered this encounter  Medications   montelukast (SINGULAIR) 10 MG tablet    Sig: TAKE 1 TABLET  BY MOUTH EVERYDAY AT BEDTIME    Dispense:  90 tablet    Refill:  3   NIFEdipine (PROCARDIA-XL/NIFEDICAL-XL) 30 MG 24 hr tablet    Sig: Take 1 tablet (30 mg total) by mouth daily.    Dispense:  90 tablet    Refill:  1    Orders Placed This Encounter  Procedures   Ambulatory referral to General Surgery     Follow-up: Return in about 3 months (around 07/10/2022) for chronic fasting.  An After Visit Summary was printed and given to the patient.  Blane Ohara, MD [facility] [phone]

## 2022-04-13 ENCOUNTER — Encounter: Payer: Self-pay | Admitting: Family Medicine

## 2022-04-14 ENCOUNTER — Other Ambulatory Visit: Payer: Self-pay

## 2022-04-14 DIAGNOSIS — R222 Localized swelling, mass and lump, trunk: Secondary | ICD-10-CM

## 2022-04-14 DIAGNOSIS — I10 Essential (primary) hypertension: Secondary | ICD-10-CM | POA: Insufficient documentation

## 2022-04-14 DIAGNOSIS — R718 Other abnormality of red blood cells: Secondary | ICD-10-CM

## 2022-04-14 DIAGNOSIS — H1010 Acute atopic conjunctivitis, unspecified eye: Secondary | ICD-10-CM | POA: Insufficient documentation

## 2022-04-14 DIAGNOSIS — F4310 Post-traumatic stress disorder, unspecified: Secondary | ICD-10-CM | POA: Insufficient documentation

## 2022-04-14 DIAGNOSIS — M62838 Other muscle spasm: Secondary | ICD-10-CM | POA: Insufficient documentation

## 2022-04-14 DIAGNOSIS — F3181 Bipolar II disorder: Secondary | ICD-10-CM

## 2022-04-14 DIAGNOSIS — G894 Chronic pain syndrome: Secondary | ICD-10-CM | POA: Insufficient documentation

## 2022-04-14 DIAGNOSIS — E785 Hyperlipidemia, unspecified: Secondary | ICD-10-CM | POA: Insufficient documentation

## 2022-04-14 HISTORY — DX: Acute atopic conjunctivitis, unspecified eye: H10.10

## 2022-04-14 HISTORY — DX: Localized swelling, mass and lump, trunk: R22.2

## 2022-04-14 HISTORY — DX: Bipolar II disorder: F31.81

## 2022-04-14 HISTORY — DX: Other abnormality of red blood cells: R71.8

## 2022-04-14 NOTE — Assessment & Plan Note (Signed)
Follow-up with psychiatry.

## 2022-04-14 NOTE — Assessment & Plan Note (Signed)
Management per specialist.   Recommended continue current medications and counseling.

## 2022-04-14 NOTE — Assessment & Plan Note (Addendum)
Refer to general surgery for excision.

## 2022-04-14 NOTE — Assessment & Plan Note (Signed)
Following receiving the patient's my chart message.  I recommended patient call cardiology on Monday and get an appt.  I also recommended if chest pain recurs and does not resolve (with rest or ntg), recommend Call EMS.

## 2022-04-14 NOTE — Assessment & Plan Note (Signed)
Defer management to neurology.  I recommend she follow up soon to start the new MS medicine. Many of her symptoms may be related to her MS.

## 2022-04-15 ENCOUNTER — Encounter: Payer: Self-pay | Admitting: Family Medicine

## 2022-04-16 ENCOUNTER — Encounter: Payer: Self-pay | Admitting: Family Medicine

## 2022-04-17 ENCOUNTER — Encounter: Payer: Self-pay | Admitting: Cardiology

## 2022-04-17 ENCOUNTER — Ambulatory Visit: Payer: Medicare Other | Attending: Cardiology | Admitting: Cardiology

## 2022-04-17 ENCOUNTER — Other Ambulatory Visit: Payer: Self-pay | Admitting: Family Medicine

## 2022-04-17 VITALS — BP 130/92 | HR 90 | Ht 65.0 in | Wt 211.4 lb

## 2022-04-17 DIAGNOSIS — I25118 Atherosclerotic heart disease of native coronary artery with other forms of angina pectoris: Secondary | ICD-10-CM | POA: Diagnosis not present

## 2022-04-17 DIAGNOSIS — E782 Mixed hyperlipidemia: Secondary | ICD-10-CM | POA: Insufficient documentation

## 2022-04-17 DIAGNOSIS — E669 Obesity, unspecified: Secondary | ICD-10-CM | POA: Diagnosis not present

## 2022-04-17 DIAGNOSIS — I10 Essential (primary) hypertension: Secondary | ICD-10-CM | POA: Insufficient documentation

## 2022-04-17 MED ORDER — NITROGLYCERIN 0.4 MG SL SUBL: 0.4000 mg | SUBLINGUAL_TABLET | SUBLINGUAL | 6 refills | Status: DC | PRN

## 2022-04-17 NOTE — Patient Instructions (Signed)
Medication Instructions:  Your physician recommends that you continue on your current medications as directed. Please refer to the Current Medication list given to you today.  *If you need a refill on your cardiac medications before your next appointment, please call your pharmacy*   Lab Work: None ordered If you have labs (blood work) drawn today and your tests are completely normal, you will receive your results only by: MyChart Message (if you have MyChart) OR A paper copy in the mail If you have any lab test that is abnormal or we need to change your treatment, we will call you to review the results.   Testing/Procedures: None ordered   Follow-Up: At [facility], you and your health needs are our priority.  As part of our continuing mission to provide you with exceptional heart care, we have created designated Provider Care Teams.  These Care Teams include your primary Cardiologist (physician) and Advanced Practice Providers (APPs -  Physician Assistants and Nurse Practitioners) who all work together to provide you with the care you need, when you need it.  We recommend signing up for the patient portal called "MyChart".  Sign up information is provided on this After Visit Summary.  MyChart is used to connect with patients for Virtual Visits (Telemedicine).  Patients are able to view lab/test results, encounter notes, upcoming appointments, etc.  Non-urgent messages can be sent to your provider as well.   To learn more about what you can do with MyChart, go to [email or web address].    Your next appointment:   9 month(s)  The format for your next appointment:   In Person  Provider:   Rajan Revankar, MD    Other Instructions none  Important Information About Sugar       

## 2022-04-17 NOTE — Progress Notes (Signed)
Cardiology Office Note:    Date:  04/17/2022   ID:  Renee Brady, DOB July 04, 1975, MRN [id number]  PCP:  [name], NP  Cardiologist:  Garwin Brothers, MD   Referring MD: Blane Ohara, MD    ASSESSMENT:    1. Coronary artery disease of native artery of native heart with stable angina pectoris (HCC)   2. Essential hypertension   3. Obesity (BMI 35.0-39.9 without comorbidity)   4. Mixed hyperlipidemia    PLAN:    In order of problems listed above:  Coronary artery disease: Secondary prevention stressed with the patient.  Importance of compliance with diet medication stressed and she vocalized understanding.  She was advised to exercise bicycle at least half an hour a day if possible 5 days a week and she promises to do so. Essential hypertension: Blood pressure stable and diet was emphasized.  Lifestyle modification urged. Mixed dyslipidemia: On lipid-lowering medications followed by primary care.  Lipids were reviewed and discussed with the patient. Obesity: Weight reduction stressed diet emphasized.  Risks of obesity explained and she promises to do better. Patient will be seen in follow-up appointment in 9 months or earlier if the patient has any concerns    Medication Adjustments/Labs and Tests Ordered: Current medicines are reviewed at length with the patient today.  Concerns regarding medicines are outlined above.  No orders of the defined types were placed in this encounter.  No orders of the defined types were placed in this encounter.    No chief complaint on file.    History of Present Illness:    Renee Brady is a 47 y.o. female.  Patient has past medical history of coronary artery disease, essential hypertension, dyslipidemia and obesity.  She tells me that she has issues with multiple sclerosis.  She denies any chest pain orthopnea or PND and takes care of activities of daily living.  She uses exercise bicycle without any problems she tries to do  her best.  She is trying to lose weight.  At the time of my evaluation, the patient is alert awake oriented and in no distress.  Past Medical History:  Diagnosis Date   Allergic conjunctivitis    ANA positive 06/26/2014   Formatting of this note might be different from the original. Overview:  Evaluated by Rheumatology Dr. Herma Carson - dx fibromyalgia given.   Angina pectoris (HCC) 05/25/2020   Ankle pain, left 08/27/2014   Formatting of this note might be different from the original. Overview:  Fell on this day with injury to Lt foot and ankle.   Anxiety and depression 04/03/2015   Asthma    Bipolar 2 disorder, major depressive episode (HCC)    suspected due to being on SSRIs and causing irritability   CAD (coronary artery disease) 07/03/2020   Cervical radiculitis 10/23/2016   Chronic pain of left knee 09/18/2014   Formatting of this note might be different from the original. Overview:  Evaluated by Ortho.   Chronic pain syndrome 02/16/2014   trauma   Dysesthesia 01/26/2019   Epidural fibrosis 06/15/2019   Essential hypertension    Fibromyalgia muscle pain 05/2014   Gait disturbance 02/11/2022   GERD (gastroesophageal reflux disease) 02/2014   Herniated nucleus pulposus, cervical 10/23/2016   High risk medication use 01/29/2018   Hyperlipidemia    Hypertension    Iron deficiency anemia    Lumbar back pain    Lumbar radiculopathy 01/26/2019   Mixed dyslipidemia 05/25/2020   Multiple sclerosis (HCC) 12/22/2014  Overview:  JC virus antibody positive with index 2.67- checked 12/2014   Muscle spasm    Nausea 08/23/2018   Numbness and tingling 04/11/2014   Obesity (BMI 35.0-39.9 without comorbidity) 05/25/2020   Other headache syndrome 01/29/2018   Pes anserinus bursitis of right knee 07/24/2014   Formatting of this note might be different from the original. PT, Life-style modification, and NSAID PRN.   Prediabetes 10/18/2021   PTSD (post-traumatic stress disorder)    Raynaud's  disease    Raynaud's disease 06/05/2014   RBC microcytosis    Spinal stenosis in cervical region 10/23/2016   Trochanteric bursitis of right hip 07/13/2014   Formatting of this note might be different from the original. Evaluated by Ortho.  Tx with CSI and PT.   Urinary urgency 01/29/2018   Vision abnormalities    Vitamin D deficiency 07/28/2014   Formatting of this note might be different from the original. Daily supplement.    Past Surgical History:  Procedure Laterality Date   ABDOMINAL HYSTERECTOMY     uterus and cervix removed but still has ovaries   CESAREAN SECTION  x 2   LEFT HEART CATH AND CORONARY ANGIOGRAPHY N/A 06/08/2020   Procedure: LEFT HEART CATH AND CORONARY ANGIOGRAPHY;  Surgeon: Kathleene Hazel, MD;  Location: [facility];  Service: Cardiovascular;  Laterality: N/A;   lumbar laminectomy Left 11/02/2016   TONSILLECTOMY     WISDOM TOOTH EXTRACTION      Current Medications: Current Meds  Medication Sig   albuterol (VENTOLIN HFA) 108 (90 Base) MCG/ACT inhaler INHALE 2 PUFFS INTO THE LUNGS AS NEEDED FOR WHEEZING OR SHORTNESS OF BREATH.   ALPRAZolam (XANAX) 0.5 MG tablet Take 0.5 mg by mouth as needed for anxiety.   amitriptyline (ELAVIL) 50 MG tablet TAKE 1 TABLET BY MOUTH EVERYDAY AT BEDTIME   aspirin EC 81 MG tablet Take 1 tablet (81 mg total) by mouth daily. Swallow whole.   atorvastatin (LIPITOR) 20 MG tablet TAKE 1 TABLET BY MOUTH EVERY DAY   cyclobenzaprine (FLEXERIL) 5 MG tablet Take 1 tablet (5 mg total) by mouth daily as needed for muscle spasms.   diclofenac (VOLTAREN) 25 MG EC tablet TAKE 1 TABLET (25 MG TOTAL) BY MOUTH DAILY AS NEEDED FOR MILD PAIN.   hydrOXYzine (ATARAX) 25 MG tablet TAKE 1 TABLET BY MOUTH 3 TIMES DAILY AS NEEDED FOR ITCHING.   ipratropium-albuterol (DUONEB) 0.5-2.5 (3) MG/3ML SOLN TAKE 3 MLS BY NEBULIZATION EVERY 6 (SIX) HOURS AS NEEDED (WHEEZING).   isosorbide mononitrate (IMDUR) 30 MG 24 hr tablet TAKE 1 TABLET BY MOUTH  EVERY DAY   methocarbamol (ROBAXIN) 500 MG tablet TAKE 1 TABLET BY MOUTH EVERY 6 HOURS AS NEEDED FOR MUSCLE SPASMS.   methylPREDNISolone (MEDROL DOSEPAK) 4 MG TBPK tablet Take 6 tablets on day 1 and decrease by 1 tablet each day until finished   metoprolol succinate (TOPROL-XL) 25 MG 24 hr tablet Take 1 tablet (25 mg total) by mouth daily.   montelukast (SINGULAIR) 10 MG tablet TAKE 1 TABLET BY MOUTH EVERYDAY AT BEDTIME   NIFEdipine (PROCARDIA-XL/NIFEDICAL-XL) 30 MG 24 hr tablet Take 1 tablet (30 mg total) by mouth daily.   nitroGLYCERIN (NITROSTAT) 0.4 MG SL tablet Place 0.4 mg under the tongue every 5 (five) minutes as needed for chest pain.   ondansetron (ZOFRAN) 4 MG tablet Take 1 tablet (4 mg total) by mouth daily as needed for nausea or vomiting.   topiramate (TOPAMAX) 25 MG tablet Take 1 tablet (25 mg total)  by mouth daily as needed.   triamcinolone cream (KENALOG) 0.1 % Apply 1 application topically 2 (two) times daily.   trospium (SANCTURA) 20 MG tablet Take 1 tablet (20 mg total) by mouth 2 (two) times daily.   zolpidem (AMBIEN) 5 MG tablet Take 5 mg by mouth at bedtime.     Allergies:   Gabapentin, Latex, Meloxicam, Oxycodone hcl, Percocet [oxycodone-acetaminophen], Prozac [fluoxetine hcl], Tecfidera [dimethyl fumarate], Tramadol, Zoloft [sertraline], Buspirone hcl, Hydrochlorothiazide, and Penicillins   Social History   Socioeconomic History   Marital status: Married    Spouse name: Not on file   Number of children: 2   Years of education: Not on file   Highest education level: Associate degree: academic program  Occupational History   Not on file  Tobacco Use   Smoking status: Former    Types: Cigarettes    Quit date: 2005    Years since quitting: 19.0   Smokeless tobacco: Never  Vaping Use   Vaping Use: Never used  Substance and Sexual Activity   Alcohol use: No   Drug use: Not Currently    Types: Marijuana   Sexual activity: Yes    Birth control/protection:  Surgical  Other Topics Concern   Not on file  Social History Narrative   One son, one daughter   Associates degree in business   Social Determinants of Health   Financial Resource Strain: Not on file  Food Insecurity: Not on file  Transportation Needs: Not on file  Physical Activity: Not on file  Stress: Not on file  Social Connections: Not on file     Family History: The patient's family history includes ADD / ADHD in her father; Cardiomyopathy in her mother; Depression in her father; Diabetes in her brother and mother; Hypertension in her father and mother; Kidney disease in her father; Stroke in her mother.  ROS:   Please see the history of present illness.    All other systems reviewed and are negative.  EKGs/Labs/Other Studies Reviewed:    The following studies were reviewed today: LEFT HEART CATH AND CORONARY ANGIOGRAPHY   Conclusion    1st Diag lesion is 80% stenosed. Prox LAD lesion is 30% stenosed. The left ventricular systolic function is normal. LV end diastolic pressure is normal. The left ventricular ejection fraction is 55-65% by visual estimate. There is no mitral valve regurgitation.   1. No left main disease 2. Mild mid LAD stenosis at the takeoff of the small first diagonal branch. The diagonal branch is small in caliber and has diffuse severe stenosis from the ostium through the mid segment.  3. No disease noted in the large caliber Circumflex artery which supplies a large obtuse marginal branch 4. The RCA is a large dominant vessel with no obstructive disease.    Recommendations: Her major epicardial vessels have no obstructive disease. The small caliber diagonal branch that supplies a small segment of myocardium has severe, diffuse ostial/proximal and mid disease. This vessel is too small for PCI. Medical management of CAD.  I think she would benefit from anti-anginal therapy. We will add Imdur 30 mg daily. If she does not tolerate this, I would  consider adding Ranexa.    Recent Labs: 06/06/2021: TSH 1.240 01/20/2022: ALT 11; BUN 16; Creatinine, Ser 1.09; Hemoglobin 12.7; Platelets 317; Potassium 4.7; Sodium 136  Recent Lipid Panel    Component Value Date/Time   CHOL 138 01/20/2022 0843   TRIG 77 01/20/2022 0843   HDL 56 01/20/2022 0843  CHOLHDL 2.5 01/20/2022 0843   LDLCALC 67 01/20/2022 0843    Physical Exam:    VS:  BP (!) 130/92   Pulse 90   Ht 5\' 5"  (1.651 m)   Wt 211 lb 6.4 oz (95.9 kg)   SpO2 98%   BMI 35.18 kg/m     Wt Readings from Last 3 Encounters:  04/17/22 211 lb 6.4 oz (95.9 kg)  04/10/22 210 lb (95.3 kg)  02/11/22 213 lb (96.6 kg)     GEN: Patient is in no acute distress HEENT: Normal NECK: No JVD; No carotid bruits LYMPHATICS: No lymphadenopathy CARDIAC: Hear sounds regular, 2/6 systolic murmur at the apex. RESPIRATORY:  Clear to auscultation without rales, wheezing or rhonchi  ABDOMEN: Soft, non-tender, non-distended MUSCULOSKELETAL:  No edema; No deformity  SKIN: Warm and dry NEUROLOGIC:  Alert and oriented x 3 PSYCHIATRIC:  Normal affect   Signed, Garwin Brothers, MD  04/17/2022 8:17 AM    [facility]

## 2022-04-26 ENCOUNTER — Encounter: Payer: Self-pay | Admitting: Neurology

## 2022-04-29 ENCOUNTER — Encounter: Payer: Self-pay | Admitting: Neurology

## 2022-04-29 ENCOUNTER — Ambulatory Visit (INDEPENDENT_AMBULATORY_CARE_PROVIDER_SITE_OTHER): Payer: Medicare Other | Admitting: Neurology

## 2022-04-29 VITALS — BP 138/83 | HR 83 | Ht 65.0 in | Wt 210.0 lb

## 2022-04-29 DIAGNOSIS — Z79899 Other long term (current) drug therapy: Secondary | ICD-10-CM | POA: Diagnosis not present

## 2022-04-29 DIAGNOSIS — I25118 Atherosclerotic heart disease of native coronary artery with other forms of angina pectoris: Secondary | ICD-10-CM

## 2022-04-29 DIAGNOSIS — R269 Unspecified abnormalities of gait and mobility: Secondary | ICD-10-CM

## 2022-04-29 DIAGNOSIS — G96198 Other disorders of meninges, not elsewhere classified: Secondary | ICD-10-CM

## 2022-04-29 DIAGNOSIS — G35D Multiple sclerosis, unspecified: Secondary | ICD-10-CM

## 2022-04-29 DIAGNOSIS — G35 Multiple sclerosis: Secondary | ICD-10-CM | POA: Diagnosis not present

## 2022-04-29 DIAGNOSIS — M542 Cervicalgia: Secondary | ICD-10-CM

## 2022-04-29 DIAGNOSIS — R3915 Urgency of urination: Secondary | ICD-10-CM

## 2022-04-29 DIAGNOSIS — R208 Other disturbances of skin sensation: Secondary | ICD-10-CM

## 2022-04-29 MED ORDER — LAMOTRIGINE 25 MG PO TABS: ORAL_TABLET | ORAL | 3 refills | Status: DC

## 2022-04-29 NOTE — Patient Instructions (Signed)
The pharmacy has the prescription of lamotrigine 25 mg. Please take it as follows: For 1 week take 1 pill once a day. The second week, take 1 pill twice a day. The third week, take 1 pill three times a day.. The fourth week take 2 pills twice a day.  Most people tolerate lamotrigine very well. Some will get a rash.   If you do get a significant rash stop the medicine immediately and let us know.  
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
## 2022-04-30 ENCOUNTER — Other Ambulatory Visit: Payer: Self-pay | Admitting: *Deleted

## 2022-04-30 MED ORDER — LAMOTRIGINE 25 MG PO TABS: ORAL_TABLET | ORAL | 3 refills | Status: DC

## 2022-05-03 ENCOUNTER — Other Ambulatory Visit: Payer: Self-pay | Admitting: Cardiology

## 2022-05-03 ENCOUNTER — Encounter: Payer: Self-pay | Admitting: Neurology

## 2022-05-05 ENCOUNTER — Other Ambulatory Visit: Payer: Self-pay | Admitting: *Deleted

## 2022-05-05 ENCOUNTER — Encounter: Payer: Self-pay | Admitting: *Deleted

## 2022-05-05 LAB — COMPREHENSIVE METABOLIC PANEL
ALT: 16 IU/L (ref 0–32)
AST: 16 IU/L (ref 0–40)
Albumin/Globulin Ratio: 1.7 (ref 1.2–2.2)
Albumin: 4.3 g/dL (ref 3.9–4.9)
Alkaline Phosphatase: 104 IU/L (ref 44–121)
BUN/Creatinine Ratio: 17 (ref 9–23)
BUN: 17 mg/dL (ref 6–24)
Bilirubin Total: 0.8 mg/dL (ref 0.0–1.2)
CO2: 23 mmol/L (ref 20–29)
Calcium: 9.3 mg/dL (ref 8.7–10.2)
Chloride: 102 mmol/L (ref 96–106)
Creatinine, Ser: 1 mg/dL (ref 0.57–1.00)
Globulin, Total: 2.6 g/dL (ref 1.5–4.5)
Glucose: 118 mg/dL — ABNORMAL HIGH (ref 70–99)
Potassium: 4.4 mmol/L (ref 3.5–5.2)
Sodium: 137 mmol/L (ref 134–144)
Total Protein: 6.9 g/dL (ref 6.0–8.5)
eGFR: 70 mL/min/{1.73_m2} (ref >59)

## 2022-05-05 LAB — QUANTIFERON-TB GOLD PLUS
QuantiFERON Mitogen Value: >10 IU/mL
QuantiFERON Nil Value: 0 IU/mL
QuantiFERON TB1 Ag Value: 0.1 IU/mL
QuantiFERON TB2 Ag Value: 0.06 IU/mL
QuantiFERON-TB Gold Plus: NEGATIVE

## 2022-05-05 LAB — CBC WITH DIFFERENTIAL/PLATELET
Basophils Absolute: 0.1 10*3/uL (ref 0.0–0.2)
Basos: 1 %
EOS (ABSOLUTE): 0.1 10*3/uL (ref 0.0–0.4)
Eos: 2 %
Hematocrit: 40.1 % (ref 34.0–46.6)
Hemoglobin: 12.6 g/dL (ref 11.1–15.9)
Immature Grans (Abs): 0 10*3/uL (ref 0.0–0.1)
Immature Granulocytes: 0 %
Lymphocytes Absolute: 1.8 10*3/uL (ref 0.7–3.1)
Lymphs: 38 %
MCH: 24.8 pg — ABNORMAL LOW (ref 26.6–33.0)
MCHC: 31.4 g/dL — ABNORMAL LOW (ref 31.5–35.7)
MCV: 79 fL (ref 79–97)
Monocytes Absolute: 0.4 10*3/uL (ref 0.1–0.9)
Monocytes: 9 %
Neutrophils Absolute: 2.4 10*3/uL (ref 1.4–7.0)
Neutrophils: 50 %
Platelets: 282 10*3/uL (ref 150–450)
RBC: 5.08 x10E6/uL (ref 3.77–5.28)
RDW: 13.7 % (ref 11.7–15.4)
WBC: 4.8 10*3/uL (ref 3.4–10.8)

## 2022-05-12 ENCOUNTER — Encounter: Payer: Self-pay | Admitting: *Deleted

## 2022-05-13 ENCOUNTER — Telehealth: Payer: Self-pay | Admitting: Neurology

## 2022-05-13 ENCOUNTER — Other Ambulatory Visit: Payer: Self-pay | Admitting: *Deleted

## 2022-05-13 DIAGNOSIS — G35 Multiple sclerosis: Secondary | ICD-10-CM

## 2022-05-13 DIAGNOSIS — G8929 Other chronic pain: Secondary | ICD-10-CM

## 2022-05-13 DIAGNOSIS — R269 Unspecified abnormalities of gait and mobility: Secondary | ICD-10-CM

## 2022-05-13 NOTE — Telephone Encounter (Signed)
Referral for physical therapy to [facility] ([place], [place]). Phone: [phone], Fax: [phone].

## 2022-05-13 NOTE — Telephone Encounter (Signed)
Referral for Orthopedic Surgery fax to [facility]. Phone:L [phone], Fax: [phone].

## 2022-05-26 ENCOUNTER — Encounter: Payer: Self-pay | Admitting: Family

## 2022-05-26 ENCOUNTER — Ambulatory Visit (INDEPENDENT_AMBULATORY_CARE_PROVIDER_SITE_OTHER): Payer: Medicare Other | Admitting: Family

## 2022-05-26 VITALS — BP 122/81 | HR 82 | Temp 97.5°F | Resp 16 | Ht 65.0 in | Wt 214.0 lb

## 2022-05-26 DIAGNOSIS — M25562 Pain in left knee: Secondary | ICD-10-CM | POA: Diagnosis not present

## 2022-05-26 DIAGNOSIS — J4540 Moderate persistent asthma, uncomplicated: Secondary | ICD-10-CM | POA: Diagnosis not present

## 2022-05-26 DIAGNOSIS — K219 Gastro-esophageal reflux disease without esophagitis: Secondary | ICD-10-CM

## 2022-05-26 DIAGNOSIS — G35 Multiple sclerosis: Secondary | ICD-10-CM

## 2022-05-26 DIAGNOSIS — E611 Iron deficiency: Secondary | ICD-10-CM | POA: Diagnosis not present

## 2022-05-26 DIAGNOSIS — I25118 Atherosclerotic heart disease of native coronary artery with other forms of angina pectoris: Secondary | ICD-10-CM

## 2022-05-26 DIAGNOSIS — E785 Hyperlipidemia, unspecified: Secondary | ICD-10-CM

## 2022-05-26 DIAGNOSIS — R7303 Prediabetes: Secondary | ICD-10-CM

## 2022-05-26 DIAGNOSIS — I10 Essential (primary) hypertension: Secondary | ICD-10-CM | POA: Diagnosis not present

## 2022-05-26 DIAGNOSIS — Z1211 Encounter for screening for malignant neoplasm of colon: Secondary | ICD-10-CM

## 2022-05-26 DIAGNOSIS — M797 Fibromyalgia: Secondary | ICD-10-CM

## 2022-05-26 DIAGNOSIS — D509 Iron deficiency anemia, unspecified: Secondary | ICD-10-CM

## 2022-05-26 DIAGNOSIS — E559 Vitamin D deficiency, unspecified: Secondary | ICD-10-CM

## 2022-05-26 DIAGNOSIS — F3181 Bipolar II disorder: Secondary | ICD-10-CM

## 2022-05-26 DIAGNOSIS — G8929 Other chronic pain: Secondary | ICD-10-CM

## 2022-05-26 DIAGNOSIS — F4310 Post-traumatic stress disorder, unspecified: Secondary | ICD-10-CM | POA: Diagnosis not present

## 2022-05-26 LAB — CBC WITH DIFFERENTIAL/PLATELET
Basophils Absolute: 0.1 10*3/uL (ref 0.0–0.1)
Basophils Relative: 0.8 % (ref 0.0–3.0)
Eosinophils Absolute: 0.1 10*3/uL (ref 0.0–0.7)
Eosinophils Relative: 1.4 % (ref 0.0–5.0)
HCT: 38.1 % (ref 36.0–46.0)
Hemoglobin: 12.3 g/dL (ref 12.0–15.0)
Lymphocytes Relative: 34.6 % (ref 12.0–46.0)
Lymphs Abs: 2.2 10*3/uL (ref 0.7–4.0)
MCHC: 32.2 g/dL (ref 30.0–36.0)
MCV: 79.5 fl (ref 78.0–100.0)
Monocytes Absolute: 0.4 10*3/uL (ref 0.1–1.0)
Monocytes Relative: 6.6 % (ref 3.0–12.0)
Neutro Abs: 3.6 10*3/uL (ref 1.4–7.7)
Neutrophils Relative %: 56.6 % (ref 43.0–77.0)
Platelets: 288 10*3/uL (ref 150.0–400.0)
RBC: 4.79 Mil/uL (ref 3.87–5.11)
RDW: 14.2 % (ref 11.5–15.5)
WBC: 6.4 10*3/uL (ref 4.0–10.5)

## 2022-05-26 LAB — COMPREHENSIVE METABOLIC PANEL
ALT: 22 U/L (ref 0–35)
AST: 15 U/L (ref 0–37)
Albumin: 3.8 g/dL (ref 3.5–5.2)
Alkaline Phosphatase: 80 U/L (ref 39–117)
BUN: 18 mg/dL (ref 6–23)
CO2: 30 mEq/L (ref 19–32)
Calcium: 9 mg/dL (ref 8.4–10.5)
Chloride: 101 mEq/L (ref 96–112)
Creatinine, Ser: 1.02 mg/dL (ref 0.40–1.20)
GFR: 65.76 mL/min (ref >60.00)
Glucose, Bld: 117 mg/dL — ABNORMAL HIGH (ref 70–99)
Potassium: 4.3 mEq/L (ref 3.5–5.1)
Sodium: 138 mEq/L (ref 135–145)
Total Bilirubin: 0.7 mg/dL (ref 0.2–1.2)
Total Protein: 6.5 g/dL (ref 6.0–8.3)

## 2022-05-26 LAB — HEMOGLOBIN A1C: Hgb A1c MFr Bld: 6.2 % (ref 4.6–6.5)

## 2022-05-26 NOTE — Assessment & Plan Note (Signed)
Vit D was normal last check. She continues otc vit D.
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
## 2022-05-26 NOTE — Assessment & Plan Note (Signed)
Lab Results  Component Value Date   CHOL 138 01/20/2022   HDL 56 01/20/2022   LDLCALC 67 01/20/2022   TRIG 77 01/20/2022   CHOLHDL 2.5 01/20/2022   Lipids at goal on lipitor 32m. Continue same.

## 2022-05-26 NOTE — Assessment & Plan Note (Signed)
>>  ASSESSMENT AND PLAN FOR ESSENTIAL HYPERTENSION WRITTEN ON 05/26/2022  9:20 AM BY O'SULLIVAN, Darral Rishel, NP  BP Readings from Last 3 Encounters:  05/26/22 122/81  04/29/22 138/83  04/17/22 (!) 130/92   BP is at goal.  Maintained on nifedipine and metoprolol.

## 2022-05-26 NOTE — Assessment & Plan Note (Addendum)
She is followed by Dr. Geraldo Pitter. She is maintained on aspirin/statin.  Has nitroglycerin.

## 2022-05-26 NOTE — Assessment & Plan Note (Signed)
She has seen Dr. Ricci Barker (ortho) who sent her to Dr. Berenice Primas.  Pt had a steroid injection in the left knee which has helped a little bit.

## 2022-05-26 NOTE — Assessment & Plan Note (Signed)
Uses albuterol PRN with good relief.

## 2022-05-26 NOTE — Assessment & Plan Note (Signed)
Lab Results  Component Value Date   HGBA1C 5.8 (H) 01/20/2022   HGBA1C 5.7 (H) 10/15/2021   HGBA1C 6.2 (H) 06/06/2021   Lab Results  Component Value Date   LDLCALC 67 01/20/2022   CREATININE 1.00 04/29/2022

## 2022-05-26 NOTE — Assessment & Plan Note (Signed)
Notes daily pain from her fibromyalgia.

## 2022-05-26 NOTE — Assessment & Plan Note (Signed)
She reports that she goes to behavioral health, declines counseling.

## 2022-05-26 NOTE — Assessment & Plan Note (Signed)
She follows with Matilde Haymaker at [facility]. She treats her anxiety/depression/insomnia.

## 2022-05-26 NOTE — Assessment & Plan Note (Signed)
Was diagnosed at age 47.  She is followed by Dr. Felecia Shelling.

## 2022-05-26 NOTE — Assessment & Plan Note (Signed)
BP Readings from Last 3 Encounters:  05/26/22 122/81  04/29/22 138/83  04/17/22 (!) 130/92   BP is at goal.  Maintained on nifedipine and metoprolol.

## 2022-05-26 NOTE — Assessment & Plan Note (Signed)
Check follow up levels.

## 2022-05-27 ENCOUNTER — Ambulatory Visit: Payer: Medicare Other | Admitting: Family Medicine

## 2022-05-27 LAB — IRON,TIBC AND FERRITIN PANEL
%SAT: 26 % (calc) (ref 16–45)
Ferritin: 69 ng/mL (ref 16–232)
Iron: 84 ug/dL (ref 40–190)
TIBC: 326 mcg/dL (calc) (ref 250–450)

## 2022-05-28 NOTE — Telephone Encounter (Signed)
Rod Holler, do you know how to add a supplement to the list that is not in EPIC?  I tried "non-formulary" but it wouldn't let me.  Please add her iron supplement if you are able.

## 2022-05-30 NOTE — Telephone Encounter (Signed)
Supplement added as reported

## 2022-06-17 ENCOUNTER — Ambulatory Visit: Payer: Medicare Other | Admitting: Neurology
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
## 2022-07-01 ENCOUNTER — Ambulatory Visit (INDEPENDENT_AMBULATORY_CARE_PROVIDER_SITE_OTHER): Payer: Medicare Other | Admitting: Family

## 2022-07-01 VITALS — BP 113/84 | HR 96 | Temp 98.0°F | Resp 16 | Ht 65.0 in | Wt 214.0 lb

## 2022-07-01 DIAGNOSIS — Z0000 Encounter for general adult medical examination without abnormal findings: Secondary | ICD-10-CM

## 2022-07-01 DIAGNOSIS — M2520 Flail joint, unspecified joint: Secondary | ICD-10-CM

## 2022-07-01 HISTORY — DX: Encounter for general adult medical examination without abnormal findings: Z00.00

## 2022-07-01 NOTE — Assessment & Plan Note (Addendum)
Discussed diet/exercise/weight loss.  Mammo up to date.  Refer for colonoscopy.  Immunizations reviewed and up to date with the exception of the latest covid booster. We discussed importance of HCPOA and I gave her the paperwork to complete and return. She is following with behavioral health for her depression.

## 2022-07-05 ENCOUNTER — Other Ambulatory Visit: Payer: Self-pay | Admitting: Family Medicine

## 2022-07-07 ENCOUNTER — Encounter: Payer: Self-pay | Admitting: Gastroenterology

## 2022-07-07 ENCOUNTER — Encounter: Payer: Self-pay | Admitting: Family

## 2022-07-12 ENCOUNTER — Encounter: Payer: Self-pay | Admitting: Family

## 2022-07-12 DIAGNOSIS — M2550 Pain in unspecified joint: Secondary | ICD-10-CM

## 2022-07-14 ENCOUNTER — Other Ambulatory Visit: Payer: Self-pay

## 2022-07-14 MED ORDER — IPRATROPIUM-ALBUTEROL 0.5-2.5 (3) MG/3ML IN SOLN: 3.0000 mL | Freq: Four times a day (QID) | RESPIRATORY_TRACT | 1 refills | Status: DC | PRN

## 2022-07-14 MED ORDER — METHOCARBAMOL 500 MG PO TABS: 500.0000 mg | ORAL_TABLET | Freq: Four times a day (QID) | ORAL | 3 refills | Status: DC | PRN

## 2022-07-14 MED ORDER — ALBUTEROL SULFATE HFA 108 (90 BASE) MCG/ACT IN AERS: 2.0000 | INHALATION_SPRAY | RESPIRATORY_TRACT | 2 refills | Status: DC | PRN

## 2022-07-14 MED ORDER — AMITRIPTYLINE HCL 50 MG PO TABS: ORAL_TABLET | ORAL | 1 refills | Status: DC

## 2022-07-16 ENCOUNTER — Other Ambulatory Visit: Payer: Self-pay | Admitting: Cardiology

## 2022-07-16 DIAGNOSIS — E782 Mixed hyperlipidemia: Secondary | ICD-10-CM

## 2022-07-17 ENCOUNTER — Other Ambulatory Visit: Payer: Self-pay | Admitting: Family

## 2022-07-17 NOTE — Telephone Encounter (Signed)
Rx to pharmacy

## 2022-07-22 ENCOUNTER — Other Ambulatory Visit: Payer: Self-pay | Admitting: Orthopedic Surgery

## 2022-07-22 DIAGNOSIS — M25551 Pain in right hip: Secondary | ICD-10-CM

## 2022-07-22 DIAGNOSIS — M25511 Pain in right shoulder: Secondary | ICD-10-CM

## 2022-07-23 ENCOUNTER — Encounter: Payer: Self-pay | Admitting: Physical Medicine and Rehabilitation

## 2022-07-23 NOTE — Addendum Note (Signed)
Addended by: [name] on: 07/23/2022 12:32 PM   Modules accepted: Orders

## 2022-08-01 ENCOUNTER — Ambulatory Visit: Payer: Medicare Other

## 2022-08-01 ENCOUNTER — Other Ambulatory Visit: Payer: Self-pay | Admitting: Orthopedic Surgery

## 2022-08-01 ENCOUNTER — Ambulatory Visit
Admission: RE | Admit: 2022-08-01 | Discharge: 2022-08-01 | Disposition: A | Discharge status: Home / Self Care | Payer: Medicare Other | Source: Ambulatory Visit | Attending: Orthopedic Surgery | Admitting: Orthopedic Surgery

## 2022-08-01 ENCOUNTER — Ambulatory Visit: Payer: Medicare Other | Admitting: Physical Medicine and Rehabilitation

## 2022-08-01 DIAGNOSIS — M25511 Pain in right shoulder: Secondary | ICD-10-CM

## 2022-08-01 DIAGNOSIS — M25551 Pain in right hip: Secondary | ICD-10-CM

## 2022-08-04 MED ORDER — LEVALBUTEROL TARTRATE 45 MCG/ACT IN AERO: 2.0000 | INHALATION_SPRAY | Freq: Four times a day (QID) | RESPIRATORY_TRACT | 5 refills | Status: DC | PRN

## 2022-08-04 NOTE — Addendum Note (Signed)
Addended byConrad Denmark D on: 08/04/2022 10:51 AM   Modules accepted: Orders

## 2022-08-05 ENCOUNTER — Other Ambulatory Visit: Payer: Self-pay

## 2022-08-08 ENCOUNTER — Encounter: Payer: Self-pay | Admitting: Family

## 2022-08-08 DIAGNOSIS — F909 Attention-deficit hyperactivity disorder, unspecified type: Secondary | ICD-10-CM

## 2022-08-08 DIAGNOSIS — M2550 Pain in unspecified joint: Secondary | ICD-10-CM

## 2022-08-10 ENCOUNTER — Encounter: Payer: Self-pay | Admitting: Family

## 2022-08-10 DIAGNOSIS — F909 Attention-deficit hyperactivity disorder, unspecified type: Secondary | ICD-10-CM

## 2022-08-10 HISTORY — DX: Attention-deficit hyperactivity disorder, unspecified type: F90.9

## 2022-08-11 ENCOUNTER — Encounter: Payer: Self-pay | Admitting: Family

## 2022-08-13 ENCOUNTER — Encounter: Payer: Self-pay | Admitting: Neurology

## 2022-08-13 ENCOUNTER — Ambulatory Visit (INDEPENDENT_AMBULATORY_CARE_PROVIDER_SITE_OTHER): Payer: Medicare Other | Admitting: Neurology

## 2022-08-13 VITALS — BP 104/73 | HR 92 | Ht 65.0 in | Wt 215.2 lb

## 2022-08-13 DIAGNOSIS — Z79899 Other long term (current) drug therapy: Secondary | ICD-10-CM | POA: Diagnosis not present

## 2022-08-13 DIAGNOSIS — G4750 Parasomnia, unspecified: Secondary | ICD-10-CM

## 2022-08-13 DIAGNOSIS — R269 Unspecified abnormalities of gait and mobility: Secondary | ICD-10-CM | POA: Diagnosis not present

## 2022-08-13 DIAGNOSIS — G35 Multiple sclerosis: Secondary | ICD-10-CM

## 2022-08-13 DIAGNOSIS — M25562 Pain in left knee: Secondary | ICD-10-CM | POA: Diagnosis not present

## 2022-08-13 DIAGNOSIS — H04129 Dry eye syndrome of unspecified lacrimal gland: Secondary | ICD-10-CM

## 2022-08-13 DIAGNOSIS — R682 Dry mouth, unspecified: Secondary | ICD-10-CM

## 2022-08-13 DIAGNOSIS — G8929 Other chronic pain: Secondary | ICD-10-CM

## 2022-08-13 DIAGNOSIS — R3915 Urgency of urination: Secondary | ICD-10-CM

## 2022-08-13 DIAGNOSIS — M2550 Pain in unspecified joint: Secondary | ICD-10-CM

## 2022-08-13 MED ORDER — TERIFLUNOMIDE 14 MG PO TABS: ORAL_TABLET | ORAL | 11 refills | Status: DC
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
## 2022-08-14 ENCOUNTER — Telehealth: Payer: Self-pay | Admitting: *Deleted

## 2022-08-14 DIAGNOSIS — G35 Multiple sclerosis: Secondary | ICD-10-CM

## 2022-08-14 LAB — ANA+ENA+DNA/DS+SCL 70+SJOSSA/B
ENA RNP Ab: 1.4 AI — ABNORMAL HIGH (ref 0.0–0.9)
ENA SM Ab Ser-aCnc: <0.2 AI (ref 0.0–0.9)
ENA SSA (RO) Ab: <0.2 AI (ref 0.0–0.9)
ENA SSB (LA) Ab: <0.2 AI (ref 0.0–0.9)

## 2022-08-14 LAB — HEPATIC FUNCTION PANEL
Albumin: 4.2 g/dL (ref 3.9–4.9)
Bilirubin Total: 0.8 mg/dL (ref 0.0–1.2)
Total Protein: 7 g/dL (ref 6.0–8.5)

## 2022-08-14 NOTE — Telephone Encounter (Signed)
Dr. Epimenio Foot sent in generic teriflunomide 14mg  po qd to pt local pharmacy.

## 2022-08-15 ENCOUNTER — Encounter: Payer: Self-pay | Admitting: Family

## 2022-08-15 NOTE — Addendum Note (Signed)
Addended by: [name] on: 08/15/2022 10:15 AM   Modules accepted: Orders

## 2022-08-18 NOTE — Telephone Encounter (Signed)
Received fax from covermymeds that PA needed. Key: [id number]

## 2022-08-18 NOTE — Telephone Encounter (Signed)
PA for teriflunomide completed and sent to [facility] [employer] via CMM. Key: [id number]. Received this message: "Your information has been submitted to [employer]. To check for an updated outcome later, reopen this PA request from your dashboard.  If [employer] has not responded to your request within 24 hours, contact [employer] at [phone]. If you think there may be a problem with your PA request, use our live chat feature at the bottom right."

## 2022-08-18 NOTE — Addendum Note (Signed)
Addended by: [name] on: 08/18/2022 01:01 PM   Modules accepted: Orders

## 2022-08-19 ENCOUNTER — Encounter: Payer: Self-pay | Admitting: Family

## 2022-08-19 ENCOUNTER — Ambulatory Visit (INDEPENDENT_AMBULATORY_CARE_PROVIDER_SITE_OTHER): Payer: Medicare Other | Admitting: Family

## 2022-08-19 ENCOUNTER — Other Ambulatory Visit: Payer: Self-pay | Admitting: Family

## 2022-08-19 VITALS — BP 102/58 | HR 91 | Temp 97.7°F | Resp 16 | Wt 216.0 lb

## 2022-08-19 DIAGNOSIS — M545 Low back pain, unspecified: Secondary | ICD-10-CM

## 2022-08-19 DIAGNOSIS — R3129 Other microscopic hematuria: Secondary | ICD-10-CM

## 2022-08-19 DIAGNOSIS — G35 Multiple sclerosis: Secondary | ICD-10-CM

## 2022-08-19 DIAGNOSIS — G4489 Other headache syndrome: Secondary | ICD-10-CM

## 2022-08-19 DIAGNOSIS — M542 Cervicalgia: Secondary | ICD-10-CM

## 2022-08-19 DIAGNOSIS — L237 Allergic contact dermatitis due to plants, except food: Secondary | ICD-10-CM

## 2022-08-19 HISTORY — DX: Cervicalgia: M54.2

## 2022-08-19 LAB — HEPATIC FUNCTION PANEL
ALT: 30 IU/L (ref 0–32)
AST: 18 IU/L (ref 0–40)
Alkaline Phosphatase: 93 IU/L (ref 44–121)
Bilirubin, Direct: 0.19 mg/dL (ref 0.00–0.40)

## 2022-08-19 LAB — RHEUMATOID FACTOR: Rheumatoid fact SerPl-aCnc: <10 IU/mL (ref <14.0)

## 2022-08-19 LAB — ANA+ENA+DNA/DS+SCL 70+SJOSSA/B
ANA Titer 1: NEGATIVE
Scleroderma (Scl-70) (ENA) Antibody, IgG: <0.2 AI (ref 0.0–0.9)
dsDNA Ab: <1 IU/mL (ref 0–9)

## 2022-08-19 MED ORDER — DICLOFENAC SODIUM 75 MG PO TBEC: 75.0000 mg | DELAYED_RELEASE_TABLET | Freq: Two times a day (BID) | ORAL | 1 refills | Status: DC | PRN

## 2022-08-19 MED ORDER — DICLOFENAC POTASSIUM 25 MG PO TABS: 1.0000 | ORAL_TABLET | Freq: Two times a day (BID) | ORAL | 1 refills | Status: DC

## 2022-08-19 NOTE — Telephone Encounter (Signed)
Alternative Requested:THE PRESCRIBED MEDICATION IS NOT COVERED BY INSURANCE. PLEASE CONSIDER CHANGING TO ONE OF THE SUGGESTED COVERED ALTERNATIVES. 

## 2022-08-19 NOTE — Progress Notes (Signed)
Subjective:   By signing my name below, I, Renee Brady, attest that this documentation has been prepared under the direction and in the presence of Lemont Fillers, NP 08/19/22   Patient ID: Renee Brady, female    DOB: [birth year], 47 y.o.   MRN: [id number]  Chief Complaint  Patient presents with   Back Pain    Complains of lower back pain radiating to both hips   Neck Pain    Complains of neck pain   Abdominal Pain    Complains of abdominal pain for about 2 weeks    HPI Patient is in today for an office visit.   Arthritis:  She has followed up with orthopaedics for her hip and shoulder pain. She recently had an MRI of her right hip and right shoulder. She has received in injection in both her right shoulder and right hip. She notes her hip would pop while walking after her hip injection. She has been going to PT for her knee pain and states she previously also received an injection in her knee. She has 10 PT visits left.  Back/neck pain: She complains of neck and back pain. She also endorses neck spasms. She had a cervical MRI in 03/2022 that revealed arthritis. She previously would manage her pain with 25 mg Diclofenac daily, which was stopped due to abnormal kidney function. She is interested in restarting it.   Of note, she had an MRI of the c spine in December which noted multilevel degenerative changes/mild spinal stenosis.  Headaches: She complains of headaches and reports hearing things that wake her up. She has followed up with her neurologist, Dr. Epimenio Foot regarding her headaches. She is treated with amitriptyline and   Abdominal pain/Constipation: She complains of sharp abdominal pain and constipation. She reports she has been constipated and states she was straining today.   MS: She is following with Dr. Epimenio Foot for her MS and plan is to begin teriflunomide.  Past Medical History:  Diagnosis Date   ADHD    Allergic conjunctivitis    ANA positive 06/26/2014    Formatting of this note might be different from the original. Overview:  Evaluated by Rheumatology Dr. Herma Carson - dx fibromyalgia given.   Angina pectoris (HCC) 05/25/2020   Ankle pain, left 08/27/2014   Formatting of this note might be different from the original. Overview:  Fell on this day with injury to Lt foot and ankle.   Anxiety and depression 04/03/2015   Asthma    Bipolar 2 disorder, major depressive episode (HCC)    suspected due to being on SSRIs and causing irritability   CAD (coronary artery disease) 07/03/2020   Cervical radiculitis 10/23/2016   Chronic allergic conjunctivitis    Chronic pain of left knee 09/18/2014   Formatting of this note might be different from the original. Overview:  Evaluated by Ortho.   Chronic pain syndrome 02/16/2014   trauma   Dysesthesia 01/26/2019   Epidural fibrosis 06/15/2019   Essential hypertension    Fibromyalgia muscle pain 05/2014   Gait disturbance 02/11/2022   GERD (gastroesophageal reflux disease) 02/2014   Herniated nucleus pulposus, cervical 10/23/2016   High risk medication use 01/29/2018   Hyperlipidemia    Hypertension    Iron deficiency anemia    Lumbar back pain    Lumbar radiculopathy 01/26/2019   Mixed dyslipidemia 05/25/2020   Multiple sclerosis (HCC) 12/22/2014   Overview:  JC virus antibody positive with index 2.67- checked 12/2014   Muscle spasm  Nausea 08/23/2018   Numbness and tingling 04/11/2014   Obesity (BMI 35.0-39.9 without comorbidity) 05/25/2020   Other headache syndrome 01/29/2018   Pes anserinus bursitis of right knee 07/24/2014   Formatting of this note might be different from the original. PT, Life-style modification, and NSAID PRN.   Prediabetes 10/18/2021   PTSD (post-traumatic stress disorder)    Raynaud's disease 06/05/2014   RBC microcytosis    Spinal stenosis in cervical region 10/23/2016   Trochanteric bursitis of right hip 07/13/2014   Formatting of this note might be different from the  original. Evaluated by Ortho.  Tx with CSI and PT.   Urinary urgency 01/29/2018   Vision abnormalities    Vitamin D deficiency 07/28/2014   Formatting of this note might be different from the original. Daily supplement.    Past Surgical History:  Procedure Laterality Date   ABDOMINAL HYSTERECTOMY     uterus and cervix removed but still has ovaries   CESAREAN SECTION  x 2   COLONOSCOPY  12/28/2009   Mild sigmoid diverticulosis. Small internal hemorrhoids.   ESOPHAGOGASTRODUODENOSCOPY  12/28/2009   Mild gastroduodenitis. Otherwise normal EGD   LEFT HEART CATH AND CORONARY ANGIOGRAPHY N/A 06/08/2020   Procedure: LEFT HEART CATH AND CORONARY ANGIOGRAPHY;  Surgeon: Kathleene Hazel, MD;  Location: [facility];  Service: Cardiovascular;  Laterality: N/A;   lumbar laminectomy Left 11/02/2016   TONSILLECTOMY     WISDOM TOOTH EXTRACTION      Family History  Problem Relation Age of Onset   Stroke Mother    Cardiomyopathy Mother    Hypertension Mother    Diabetes Mother    Hypertension Father    ADD / ADHD Father    Depression Father    Kidney disease Father        Stage 3   Diabetes Brother     Social History   Socioeconomic History   Marital status: Married    Spouse name: Not on file   Number of children: 2   Years of education: Not on file   Highest education level: Associate degree: academic program  Occupational History   Not on file  Tobacco Use   Smoking status: Former    Types: Cigarettes    Quit date: 2005    Years since quitting: 19.3   Smokeless tobacco: Never  Vaping Use   Vaping Use: Never used  Substance and Sexual Activity   Alcohol use: No   Drug use: Yes    Types: Marijuana   Sexual activity: Yes    Birth control/protection: Surgical  Other Topics Concern   Not on file  Social History Narrative   One son, one daughter   Associates degree in business   One son in the army and one daughter   2 grand daughters   Married   Enjoys,  reading, walking, stationary bike   Social Determinants of Corporate investment banker Strain: Not on file  Food Insecurity: Not on file  Transportation Needs: Not on file  Physical Activity: Not on file  Stress: Not on file  Social Connections: Not on file  Intimate Partner Violence: Not on file    Outpatient Medications Prior to Visit  Medication Sig Dispense Refill   ALPRAZolam (XANAX) 0.5 MG tablet Take 0.5 mg by mouth as needed for anxiety.     amitriptyline (ELAVIL) 50 MG tablet TAKE 1 TABLET BY MOUTH EVERYDAY AT BEDTIME 90 tablet 1   aspirin EC 81 MG tablet Take  1 tablet (81 mg total) by mouth daily. Swallow whole. 90 tablet 3   atorvastatin (LIPITOR) 20 MG tablet TAKE 1 TABLET BY MOUTH EVERY DAY 90 tablet 1   Ferrous Sulfate (IRON PO) Take 1 Dose by mouth daily.     hydrOXYzine (ATARAX) 25 MG tablet TAKE 1 TABLET BY MOUTH 3 TIMES DAILY AS NEEDED FOR ITCHING. 270 tablet 2   ipratropium-albuterol (DUONEB) 0.5-2.5 (3) MG/3ML SOLN Take 3 mLs by nebulization every 6 (six) hours as needed (wheezing). 360 mL 1   isosorbide mononitrate (IMDUR) 30 MG 24 hr tablet Take 1 tablet (30 mg total) by mouth daily. 90 tablet 3   levalbuterol (XOPENEX HFA) 45 MCG/ACT inhaler Inhale 2 puffs into the lungs every 6 (six) hours as needed for wheezing or shortness of breath. 15 g 5   methocarbamol (ROBAXIN) 500 MG tablet Take 1 tablet (500 mg total) by mouth every 6 (six) hours as needed for muscle spasms. 120 tablet 3   metoprolol succinate (TOPROL-XL) 25 MG 24 hr tablet Take 1 tablet (25 mg total) by mouth daily. 90 tablet 2   montelukast (SINGULAIR) 10 MG tablet TAKE 1 TABLET BY MOUTH EVERYDAY AT BEDTIME 90 tablet 3   Multiple Vitamin (MULTIVITAMIN ADULT PO) Take 1 tablet by mouth daily.     NIFEdipine (PROCARDIA-XL/NIFEDICAL-XL) 30 MG 24 hr tablet Take 1 tablet (30 mg total) by mouth daily. 90 tablet 1   nitroGLYCERIN (NITROSTAT) 0.4 MG SL tablet Place 1 tablet (0.4 mg total) under the tongue every  5 (five) minutes as needed for chest pain. 25 tablet 6   ondansetron (ZOFRAN) 4 MG tablet Take 1 tablet (4 mg total) by mouth daily as needed for nausea or vomiting. 30 tablet 2   Teriflunomide 14 MG TABS One po qd 30 tablet 11   topiramate (TOPAMAX) 25 MG tablet Take 25 mg by mouth 2 (two) times daily.     triamcinolone cream (KENALOG) 0.1 % Apply 1 application topically 2 (two) times daily. 30 g 0   trospium (SANCTURA) 20 MG tablet Take 1 tablet (20 mg total) by mouth 2 (two) times daily. 60 tablet 11   zolpidem (AMBIEN) 5 MG tablet Take 5 mg by mouth at bedtime.     No facility-administered medications prior to visit.    Allergies  Allergen Reactions   Gabapentin Other (See Comments)    Change in Mental attitude   Latex Itching    Power   Meloxicam Nausea Only   Oxycodone Hcl Itching and Nausea Only   Percocet [Oxycodone-Acetaminophen] Nausea Only   Prozac [Fluoxetine Hcl] Other (See Comments)    Mental Changes   Tecfidera [Dimethyl Fumarate]     Body shut down, constipation   Tramadol Itching   Zoloft [Sertraline] Other (See Comments)    Caused Heart Porblems   Buspirone Hcl Palpitations   Hydrochlorothiazide Palpitations   Penicillins Itching and Rash    Reaction: In her 20's    ROS    See HPI Objective:    Physical Exam Constitutional:      General: She is not in acute distress.    Appearance: Normal appearance. She is well-developed.  HENT:     Head: Normocephalic and atraumatic.     Right Ear: External ear normal.     Left Ear: External ear normal.  Eyes:     General: No scleral icterus. Neck:     Thyroid: No thyromegaly.  Cardiovascular:     Rate and Rhythm: Normal rate and regular  rhythm.     Heart sounds: Normal heart sounds. No murmur heard. Pulmonary:     Effort: Pulmonary effort is normal. No respiratory distress.     Breath sounds: Normal breath sounds. No wheezing.  Abdominal:     General: Bowel sounds are normal.     Tenderness: There is no  abdominal tenderness.  Musculoskeletal:     Cervical back: Neck supple. Tenderness present.     Thoracic back: Normal.     Lumbar back: Normal.  Skin:    General: Skin is warm and dry.  Neurological:     Mental Status: She is alert and oriented to person, place, and time.  Psychiatric:        Mood and Affect: Mood normal.        Behavior: Behavior normal.        Thought Content: Thought content normal.        Judgment: Judgment normal.     BP (!) 102/58 (BP Location: Left Arm, Patient Position: Sitting, Cuff Size: Large)   Pulse 91   Temp 97.7 F (36.5 C) (Oral)   Resp 16   Wt 216 lb (98 kg)   SpO2 100%   BMI 35.94 kg/m  Wt Readings from Last 3 Encounters:  08/19/22 216 lb (98 kg)  08/13/22 215 lb 3.2 oz (97.6 kg)  07/01/22 214 lb (97.1 kg)       Assessment & Plan:  Neck pain Assessment & Plan: Likely due to degenerative disc disease.  She has had good pain relief in the past from low dose diclofenac.  Her most recent renal function testing was WNL. Will begin diclofenac with plans to monitor renal function carefully.    Midline low back pain, unspecified chronicity, unspecified whether sciatica present Assessment & Plan: Likely has degenerative disc disease in her thoracic and lumbar spine.  I think she may be having some radicular pain wrapping around her abdomen. Offered PT for her neck and back pain. She feels that she is in too much pain to participate at this time. Hopefully she will find some relief with the diclofenac.    Other headache syndrome Assessment & Plan: Defer management to neurology.    Multiple sclerosis (HCC) Assessment & Plan: This is being treated by Neurology- Dr. Epimenio Foot.    Other orders -     Diclofenac Potassium; Take 1 tablet by mouth in the morning and at bedtime.  Dispense: 60 tablet; Refill: 1   40 minutes spent on today's visit. Time was spent counseling patient, reviewing internal and external records and reaching out to  specialist.   I,Renee Brady,acting as a scribe for Lemont Fillers, NP.,have documented all relevant documentation on the behalf of Lemont Fillers, NP,as directed by  Lemont Fillers, NP while in the presence of Lemont Fillers, NP.   I, Lemont Fillers, NP, personally preformed the services described in this documentation.  All medical record entries made by the scribe were at my direction and in my presence.  I have reviewed the chart and discharge instructions (if applicable) and agree that the record reflects my personal performance and is accurate and complete. 08/19/22   Lemont Fillers, NP

## 2022-08-19 NOTE — Assessment & Plan Note (Signed)
This is being treated by Neurology- Dr. Epimenio Foot.

## 2022-08-19 NOTE — Assessment & Plan Note (Signed)
Likely due to degenerative disc disease.  She has had good pain relief in the past from low dose diclofenac.  Her most recent renal function testing was WNL. Will begin diclofenac with plans to monitor renal function carefully.

## 2022-08-19 NOTE — Telephone Encounter (Signed)
Received fax from [employer] that PA approved 08/18/22-08/18/23. PA# Standard Opt out with [employer] [id number]

## 2022-08-19 NOTE — Assessment & Plan Note (Signed)
Likely has degenerative disc disease in her thoracic and lumbar spine.  I think she may be having some radicular pain wrapping around her abdomen. Offered PT for her neck and back pain. She feels that she is in too much pain to participate at this time. Hopefully she will find some relief with the diclofenac.

## 2022-08-19 NOTE — Assessment & Plan Note (Signed)
-   Defer management to neurology

## 2022-08-20 MED ORDER — CELECOXIB 50 MG PO CAPS: 50.0000 mg | ORAL_CAPSULE | Freq: Two times a day (BID) | ORAL | 2 refills | Status: DC

## 2022-08-20 NOTE — Addendum Note (Signed)
Addended by: [name] on: 08/20/2022 11:43 AM   Modules accepted: Orders

## 2022-08-21 ENCOUNTER — Other Ambulatory Visit: Payer: Self-pay | Admitting: Family

## 2022-08-21 ENCOUNTER — Telehealth: Payer: Self-pay | Admitting: Family

## 2022-08-21 MED ORDER — ALBUTEROL SULFATE HFA 108 (90 BASE) MCG/ACT IN AERS: 2.0000 | INHALATION_SPRAY | Freq: Four times a day (QID) | RESPIRATORY_TRACT | 5 refills | Status: DC | PRN

## 2022-08-21 NOTE — Telephone Encounter (Signed)
-----   Message from Asa Lente, MD sent at 08/20/2022  7:48 PM EDT ----- The anti-RNP was borderline positive.  All of the other components including SSA/SSB were negative.  With the results being only borderline positive and the ANA being negative, this is likely a false positive. ----- Message ----- From: [name], NP Sent: 08/19/2022   1:40 PM EDT To: Asa Lente, MD  Dear Dr. Epimenio Foot,  I just wanted to reach out quickly to touch base on our mutual patient.  She again complained to me about dry eye and dry mouth.  I saw that you performed an autoimmune panel and that her ENA RNP Ab was elevated but the remainder of the panel was normal.  What are your thoughts on that result?  I don't have a lot of experience interpreting these panels.  Thanks,  [employer]

## 2022-08-22 ENCOUNTER — Other Ambulatory Visit: Payer: Self-pay | Admitting: Family

## 2022-08-22 MED ORDER — DICLOFENAC SODIUM 25 MG PO TBEC: 50.0000 mg | DELAYED_RELEASE_TABLET | Freq: Two times a day (BID) | ORAL | 1 refills | Status: DC

## 2022-08-27 ENCOUNTER — Other Ambulatory Visit: Payer: Self-pay

## 2022-08-29 MED ORDER — TRIAMCINOLONE ACETONIDE 0.1 % EX CREA
1.0000 | TOPICAL_CREAM | Freq: Two times a day (BID) | CUTANEOUS | 1 refills | Status: DC
Start: 2022-08-29 — End: 2022-11-03

## 2022-08-29 NOTE — Addendum Note (Signed)
Addended by: [name] on: 08/29/2022 12:33 PM   Modules accepted: Orders

## 2022-09-04 NOTE — Telephone Encounter (Signed)
Dr. Epimenio Foot- are you ok with 7mg  dose?

## 2022-09-05 MED ORDER — MONTELUKAST SODIUM 10 MG PO TABS: ORAL_TABLET | ORAL | 1 refills | Status: DC

## 2022-09-05 NOTE — Telephone Encounter (Signed)
Please schedule pt to come to lab to repeat urinalysis and urine culture.   Refill sent for montaleukast.

## 2022-09-05 NOTE — Addendum Note (Signed)
Addended by: [name] on: 09/05/2022 08:11 AM   Modules accepted: Orders

## 2022-09-05 NOTE — Telephone Encounter (Signed)
Patient scheduled to come in 09/09/22 for ua and culture

## 2022-09-05 NOTE — Telephone Encounter (Signed)
Called patient but no answer, left voice mail for patient to call back. She needs lab appointment for ua and culture. Orders entered as future

## 2022-09-08 ENCOUNTER — Encounter: Payer: Self-pay | Admitting: Physical Medicine and Rehabilitation

## 2022-09-08 ENCOUNTER — Encounter
Payer: Medicare Other | Attending: Physical Medicine and Rehabilitation | Admitting: Physical Medicine and Rehabilitation

## 2022-09-08 VITALS — BP 135/92 | HR 83 | Ht 65.0 in | Wt 216.4 lb

## 2022-09-08 DIAGNOSIS — S134XXS Sprain of ligaments of cervical spine, sequela: Secondary | ICD-10-CM

## 2022-09-08 DIAGNOSIS — M2550 Pain in unspecified joint: Secondary | ICD-10-CM

## 2022-09-08 DIAGNOSIS — M7918 Myalgia, other site: Secondary | ICD-10-CM | POA: Diagnosis present

## 2022-09-08 DIAGNOSIS — R29898 Other symptoms and signs involving the musculoskeletal system: Secondary | ICD-10-CM

## 2022-09-08 MED ORDER — TERIFLUNOMIDE 7 MG PO TABS
1.00 | ORAL_TABLET | Freq: Every day | ORAL | 11 refills | Status: DC
Start: 2022-09-08 — End: 2022-10-14

## 2022-09-08 NOTE — Patient Instructions (Signed)
Blue emu oil 
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
## 2022-09-08 NOTE — Addendum Note (Signed)
Addended by: Arther Abbott on: 09/08/2022 07:12 AM   Modules accepted: Orders

## 2022-09-08 NOTE — Addendum Note (Signed)
Addended by: Horton Chin on: 09/08/2022 10:43 AM   Modules accepted: Level of Service

## 2022-09-09 ENCOUNTER — Other Ambulatory Visit (INDEPENDENT_AMBULATORY_CARE_PROVIDER_SITE_OTHER): Payer: Medicare Other

## 2022-09-09 DIAGNOSIS — R3129 Other microscopic hematuria: Secondary | ICD-10-CM | POA: Diagnosis not present

## 2022-09-09 LAB — URINALYSIS, ROUTINE W REFLEX MICROSCOPIC
Bilirubin Urine: NEGATIVE
Hgb urine dipstick: NEGATIVE
Ketones, ur: NEGATIVE
Leukocytes,Ua: NEGATIVE
Nitrite: NEGATIVE
RBC / HPF: NONE SEEN (ref 0–?)
Specific Gravity, Urine: <=1.005 — AB (ref 1.000–1.030)
Total Protein, Urine: NEGATIVE
Urine Glucose: NEGATIVE
Urobilinogen, UA: 0.2 (ref 0.0–1.0)
pH: 6.5 (ref 5.0–8.0)

## 2022-09-10 ENCOUNTER — Encounter: Payer: Self-pay | Admitting: Family

## 2022-09-10 DIAGNOSIS — R768 Other specified abnormal immunological findings in serum: Secondary | ICD-10-CM

## 2022-09-10 LAB — URINE CULTURE
MICRO NUMBER:: 15038619
SPECIMEN QUALITY:: ADEQUATE

## 2022-09-12 ENCOUNTER — Encounter: Payer: Self-pay | Admitting: Neurology

## 2022-09-14 MED ORDER — METOPROLOL SUCCINATE ER 25 MG PO TB24: 25.0000 mg | ORAL_TABLET | Freq: Every day | ORAL | 1 refills | Status: DC

## 2022-09-14 MED ORDER — MONTELUKAST SODIUM 10 MG PO TABS: ORAL_TABLET | ORAL | 1 refills | Status: DC

## 2022-09-14 MED ORDER — AMITRIPTYLINE HCL 50 MG PO TABS: ORAL_TABLET | ORAL | 1 refills | Status: DC

## 2022-09-14 MED ORDER — ALBUTEROL SULFATE HFA 108 (90 BASE) MCG/ACT IN AERS: 2.0000 | INHALATION_SPRAY | Freq: Four times a day (QID) | RESPIRATORY_TRACT | 1 refills | Status: DC | PRN

## 2022-09-14 MED ORDER — NIFEDIPINE ER OSMOTIC RELEASE 30 MG PO TB24: 30.0000 mg | ORAL_TABLET | Freq: Every day | ORAL | 1 refills | Status: DC

## 2022-09-15 ENCOUNTER — Other Ambulatory Visit: Payer: Self-pay

## 2022-09-15 ENCOUNTER — Telehealth: Payer: Self-pay

## 2022-09-15 ENCOUNTER — Ambulatory Visit: Payer: Medicare Other | Admitting: Orthopaedic Surgery

## 2022-09-15 MED ORDER — ONDANSETRON HCL 4 MG PO TABS: 4.0000 mg | ORAL_TABLET | Freq: Every day | ORAL | 2 refills | Status: DC | PRN

## 2022-09-15 MED ORDER — TROSPIUM CHLORIDE 20 MG PO TABS: 20.0000 mg | ORAL_TABLET | Freq: Two times a day (BID) | ORAL | 2 refills | Status: DC

## 2022-09-15 NOTE — Telephone Encounter (Signed)
Pt last seen 08/13/2022 Upcoming Appointment 02/24/2023  Zofran Last Filled 02/11/2022 Trospium Last  Filled 02/11/2022 Escript Both 09/15/2022

## 2022-09-18 ENCOUNTER — Encounter: Payer: Self-pay | Admitting: Family

## 2022-09-18 ENCOUNTER — Other Ambulatory Visit: Payer: Self-pay | Admitting: Family

## 2022-09-18 MED ORDER — HYDROXYZINE HCL 25 MG PO TABS: ORAL_TABLET | ORAL | 2 refills | Status: DC

## 2022-09-19 ENCOUNTER — Other Ambulatory Visit: Payer: Self-pay | Admitting: Family

## 2022-09-19 MED ORDER — METHOCARBAMOL 500 MG PO TABS: 500.0000 mg | ORAL_TABLET | Freq: Four times a day (QID) | ORAL | 0 refills | Status: DC | PRN

## 2022-09-19 MED ORDER — METHOCARBAMOL 500 MG PO TABS: 500.0000 mg | ORAL_TABLET | Freq: Four times a day (QID) | ORAL | 3 refills | Status: DC | PRN

## 2022-09-22 MED ORDER — ALBUTEROL SULFATE HFA 108 (90 BASE) MCG/ACT IN AERS: 2.0000 | INHALATION_SPRAY | Freq: Four times a day (QID) | RESPIRATORY_TRACT | 1 refills | Status: DC | PRN

## 2022-09-22 NOTE — Addendum Note (Signed)
Addended by: Thelma Barge D on: 09/22/2022 09:04 AM   Modules accepted: Orders

## 2022-09-22 NOTE — Telephone Encounter (Signed)
I called patient to discuss if she has started the teriflunomide. No answer, left a message asking her to call me back.

## 2022-09-23 ENCOUNTER — Telehealth: Payer: Self-pay

## 2022-09-23 NOTE — Telephone Encounter (Signed)
Spoke to Pharmacy staff and they stated that because of pt's insurance it will only cover 18 day supply of Zofran. Pharmacist stated that they have informed the pt about this matter before.

## 2022-09-24 ENCOUNTER — Encounter: Payer: Self-pay | Admitting: Gastroenterology

## 2022-09-24 ENCOUNTER — Ambulatory Visit (INDEPENDENT_AMBULATORY_CARE_PROVIDER_SITE_OTHER): Payer: Medicare Other | Admitting: Gastroenterology

## 2022-09-24 ENCOUNTER — Other Ambulatory Visit (INDEPENDENT_AMBULATORY_CARE_PROVIDER_SITE_OTHER): Payer: Medicare Other

## 2022-09-24 ENCOUNTER — Encounter: Payer: Self-pay | Admitting: Family

## 2022-09-24 VITALS — BP 126/76 | HR 103 | Ht 65.0 in | Wt 222.0 lb

## 2022-09-24 DIAGNOSIS — K581 Irritable bowel syndrome with constipation: Secondary | ICD-10-CM

## 2022-09-24 DIAGNOSIS — R1084 Generalized abdominal pain: Secondary | ICD-10-CM

## 2022-09-24 DIAGNOSIS — K219 Gastro-esophageal reflux disease without esophagitis: Secondary | ICD-10-CM

## 2022-09-24 DIAGNOSIS — R1310 Dysphagia, unspecified: Secondary | ICD-10-CM

## 2022-09-24 LAB — CBC WITH DIFFERENTIAL/PLATELET
Basophils Absolute: 0.1 10*3/uL (ref 0.0–0.1)
Basophils Relative: 0.9 % (ref 0.0–3.0)
Eosinophils Absolute: 0 10*3/uL (ref 0.0–0.7)
Eosinophils Relative: 0.7 % (ref 0.0–5.0)
HCT: 38.5 % (ref 36.0–46.0)
Hemoglobin: 12 g/dL (ref 12.0–15.0)
Lymphocytes Relative: 34.5 % (ref 12.0–46.0)
Lymphs Abs: 2.3 10*3/uL (ref 0.7–4.0)
MCHC: 31.2 g/dL (ref 30.0–36.0)
MCV: 79.5 fl (ref 78.0–100.0)
Monocytes Absolute: 0.5 10*3/uL (ref 0.1–1.0)
Monocytes Relative: 6.9 % (ref 3.0–12.0)
Neutro Abs: 3.9 10*3/uL (ref 1.4–7.7)
Neutrophils Relative %: 57 % (ref 43.0–77.0)
Platelets: 297 10*3/uL (ref 150.0–400.0)
RBC: 4.84 Mil/uL (ref 3.87–5.11)
RDW: 14 % (ref 11.5–15.5)
WBC: 6.8 10*3/uL (ref 4.0–10.5)

## 2022-09-24 LAB — COMPREHENSIVE METABOLIC PANEL
ALT: 22 U/L (ref 0–35)
AST: 14 U/L (ref 0–37)
Albumin: 3.9 g/dL (ref 3.5–5.2)
Alkaline Phosphatase: 73 U/L (ref 39–117)
BUN: 13 mg/dL (ref 6–23)
CO2: 28 mEq/L (ref 19–32)
Calcium: 8.9 mg/dL (ref 8.4–10.5)
Chloride: 103 mEq/L (ref 96–112)
Creatinine, Ser: 1.01 mg/dL (ref 0.40–1.20)
GFR: 66.39 mL/min (ref >60.00)
Glucose, Bld: 130 mg/dL — ABNORMAL HIGH (ref 70–99)
Potassium: 3.9 mEq/L (ref 3.5–5.1)
Sodium: 136 mEq/L (ref 135–145)
Total Bilirubin: 0.7 mg/dL (ref 0.2–1.2)
Total Protein: 6.9 g/dL (ref 6.0–8.3)

## 2022-09-24 LAB — LIPASE: Lipase: 12 U/L (ref 11.0–59.0)

## 2022-09-24 MED ORDER — NA SULFATE-K SULFATE-MG SULF 17.5-3.13-1.6 GM/177ML PO SOLN: 1.0000 | Freq: Once | ORAL | 0 refills | Status: AC

## 2022-09-24 MED ORDER — ESOMEPRAZOLE MAGNESIUM 40 MG PO CPDR: 40.0000 mg | DELAYED_RELEASE_CAPSULE | Freq: Every day | ORAL | 4 refills | Status: DC

## 2022-09-24 NOTE — Patient Instructions (Signed)
_______________________________________________________  If your blood pressure at your visit was 140/90 or greater, please contact your primary care physician to follow up on this.  _______________________________________________________  If you are age 47 or older, your body mass index should be between 23-30. Your Body mass index is 36.94 kg/m. If this is out of the aforementioned range listed, please consider follow up with your Primary Care Provider.  If you are age 29 or younger, your body mass index should be between 19-25. Your Body mass index is 36.94 kg/m. If this is out of the aformentioned range listed, please consider follow up with your Primary Care Provider.   ________________________________________________________  The [facility] providers would like to encourage you to use [facility] to communicate with providers for non-urgent requests or questions.  Due to long hold times on the telephone, sending your provider a message by [facility] may be a faster and more efficient way to get a response.  Please allow 48 business hours for a response.  Please remember that this is for non-urgent requests.  _______________________________________________________  Your provider has requested that you go to the basement level for lab work before leaving today. Press "B" on the elevator. The lab is located at the first door on the left as you exit the elevator.  We have sent the following medications to your pharmacy for you to pick up at your convenience: Nexium Suprep  Please purchase the following medications over the counter and take as directed: Miralax 17g daily  Two days before your procedure: Mix 3 packs (or capfuls) of Miralax in 48 ounces of clear liquid and drink at 6pm.  You have been scheduled for an endoscopy and colonoscopy. Please follow the written instructions given to you at your visit today. Please pick up your prep supplies at the pharmacy within the next 1-3 days. If  you use inhalers (even only as needed), please bring them with you on the day of your procedure.   You have been scheduled for a CT scan of the abdomen and pelvis at [facility] ([address] [place], [place], [place] [address]).   You are scheduled on 10-06-2022 at 1pm. You should at 10:45am to check in and drink contrast on site. Please follow the written instructions below on the day of your exam:  WARNING: IF YOU ARE ALLERGIC TO IODINE/X-RAY DYE, PLEASE NOTIFY RADIOLOGY IMMEDIATELY AT [phone]! YOU WILL BE GIVEN A 13 HOUR PREMEDICATION PREP.  1) Do not eat or drink anything after 9am (4 hours prior to your test) 2) Drink 1 bottle of contrast @ 11am (2 hours prior to your exam) Drink 1 bottle of contrast @ 12pm (1 hour prior to your exam)  You may take any medications as prescribed with a small amount of water, if necessary. If you take any of the following medications: METFORMIN, GLUCOPHAGE, GLUCOVANCE, AVANDAMET, RIOMET, FORTAMET, ACTOPLUS MET, JANUMET, GLUMETZA or METAGLIP, you MAY be asked to HOLD this medication 48 hours AFTER the exam.  The purpose of you drinking the oral contrast is to aid in the visualization of your intestinal tract. The contrast solution may cause some diarrhea. Depending on your individual set of symptoms, you may also receive an intravenous injection of x-ray contrast/dye. Plan on being at [facility] for 30 minutes or longer, depending on the type of exam you are having performed.  This test typically takes 30-45 minutes to complete.  If you have any questions regarding your exam or if you need to reschedule, you  may call the CT department at [phone] between the hours of 8:00 am and 5:00 pm, Monday-Friday.  ________________________________________________________________________  Bonita Quin have been scheduled for a Barium Esophogram at [facility] (1st floor of the hospital) on 10-10-2022 at 11am. Please arrive 30 minutes prior to your  appointment for registration. Make certain not to have anything to eat or drink 3 hours prior to your test. If you need to reschedule for any reason, please contact radiology at [phone] to do so. __________________________________________________________________ A barium swallow is an examination that concentrates on views of the esophagus. This tends to be a double contrast exam (barium and two liquids which, when combined, create a gas to distend the wall of the oesophagus) or single contrast (non-ionic iodine based). The study is usually tailored to your symptoms so a good history is essential. Attention is paid during the study to the form, structure and configuration of the esophagus, looking for functional disorders (such as aspiration, dysphagia, achalasia, motility and reflux) EXAMINATION You may be asked to change into a gown, depending on the type of swallow being performed. A radiologist and radiographer will perform the procedure. The radiologist will advise you of the type of contrast selected for your procedure and direct you during the exam. You will be asked to stand, sit or lie in several different positions and to hold a small amount of fluid in your mouth before being asked to swallow while the imaging is performed .In some instances you may be asked to swallow barium coated marshmallows to assess the motility of a solid food bolus. The exam can be recorded as a digital or video fluoroscopy procedure. POST PROCEDURE It will take 1-2 days for the barium to pass through your system. To facilitate this, it is important, unless otherwise directed, to increase your fluids for the next 24-48hrs and to resume your normal diet.  This test typically takes about 30 minutes to perform. __________________________________________________________________________________  Thank you,  Dr. Lynann Bologna
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
## 2022-09-25 ENCOUNTER — Encounter: Payer: Self-pay | Admitting: Gastroenterology

## 2022-09-29 ENCOUNTER — Other Ambulatory Visit: Payer: Self-pay | Admitting: Family

## 2022-09-29 ENCOUNTER — Ambulatory Visit (INDEPENDENT_AMBULATORY_CARE_PROVIDER_SITE_OTHER): Payer: Medicare Other | Admitting: Family

## 2022-09-29 ENCOUNTER — Ambulatory Visit: Payer: Medicare Other | Admitting: Family

## 2022-09-29 ENCOUNTER — Telehealth: Payer: Self-pay | Admitting: Family

## 2022-09-29 VITALS — BP 121/79 | HR 105 | Temp 98.2°F | Wt 221.0 lb

## 2022-09-29 DIAGNOSIS — M2520 Flail joint, unspecified joint: Secondary | ICD-10-CM

## 2022-09-29 DIAGNOSIS — J309 Allergic rhinitis, unspecified: Secondary | ICD-10-CM

## 2022-09-29 DIAGNOSIS — J4540 Moderate persistent asthma, uncomplicated: Secondary | ICD-10-CM

## 2022-09-29 DIAGNOSIS — F419 Anxiety disorder, unspecified: Secondary | ICD-10-CM | POA: Diagnosis not present

## 2022-09-29 DIAGNOSIS — R7303 Prediabetes: Secondary | ICD-10-CM | POA: Diagnosis not present

## 2022-09-29 DIAGNOSIS — D509 Iron deficiency anemia, unspecified: Secondary | ICD-10-CM

## 2022-09-29 DIAGNOSIS — I10 Essential (primary) hypertension: Secondary | ICD-10-CM

## 2022-09-29 DIAGNOSIS — F32A Depression, unspecified: Secondary | ICD-10-CM

## 2022-09-29 HISTORY — DX: Allergic rhinitis, unspecified: J30.9

## 2022-09-29 LAB — HEMOGLOBIN A1C: Hgb A1c MFr Bld: 6 % (ref 4.6–6.5)

## 2022-09-29 MED ORDER — FLUTICASONE-SALMETEROL 115-21 MCG/ACT IN AERO: 2.0000 | INHALATION_SPRAY | Freq: Two times a day (BID) | RESPIRATORY_TRACT | 1 refills | Status: DC

## 2022-09-29 NOTE — Progress Notes (Unsigned)
Subjective:     Patient ID: Renee Brady, female    DOB: 05-Sep-1975, 47 y.o.   MRN: [id number]  No chief complaint on file.   HPI  Discussed the use of AI scribe software for clinical note transcription with the patient, who gave verbal consent to proceed.  History of Present Illness   The patient, with a history of asthma, allergic rhinitis, and bipolar disorder, presents with multiple complaints. The chief complaint is joint pain and hypermobility, which the patient believes is not related to their previous diagnosis of multiple sclerosis (MS). The patient reports having been diagnosed with hypermobility since 2016 and has been experiencing various symptoms related to this condition, including neck pain, back pain, and hip pain. The patient also reports having allergic rhinitis and is currently on montelukast for this condition. However, the patient feels that adding Claritin to their regimen might help manage their symptoms better.  The patient also reports having asthma, which has been requiring daily use of their rescue inhaler. The patient believes that their neck condition might be contributing to their asthma symptoms. The patient also has a history of bipolar disorder but is not currently on any medication for this condition due to previous adverse reactions.  The patient also reports having stomach pain, which they describe as traveling from one location to another.  The patient also reports having hip pain, which they believe is related to their hypermobility. The patient reports that their legs seem to be dislocating more frequently, which they attribute to their hypermobility. The patient also reports having knee pain, which they believe is related to their hypermobility.     Lab Results  Component Value Date   CHOL 138 01/20/2022   HDL 56 01/20/2022   LDLCALC 67 01/20/2022   TRIG 77 01/20/2022   CHOLHDL 2.5 01/20/2022        Health Maintenance Due  Topic Date Due    Colonoscopy  Never done   COVID-19 Vaccine (5 - 2023-24 season) 12/06/2021    Past Medical History:  Diagnosis Date   ADHD    Allergic conjunctivitis    ANA positive 06/26/2014   Formatting of this note might be different from the original. Overview:  Evaluated by Rheumatology Dr. Herma Carson - dx fibromyalgia given.   Angina pectoris (HCC) 05/25/2020   Ankle pain, left 08/27/2014   Formatting of this note might be different from the original. Overview:  Fell on this day with injury to Lt foot and ankle.   Anxiety and depression 04/03/2015   Asthma    Bipolar 2 disorder, major depressive episode (HCC)    suspected due to being on SSRIs and causing irritability   CAD (coronary artery disease) 07/03/2020   Cervical radiculitis 10/23/2016   Chronic allergic conjunctivitis    Chronic pain of left knee 09/18/2014   Formatting of this note might be different from the original. Overview:  Evaluated by Ortho.   Chronic pain syndrome 02/16/2014   trauma   Dysesthesia 01/26/2019   Epidural fibrosis 06/15/2019   Essential hypertension    Fibromyalgia muscle pain 05/2014   Gait disturbance 02/11/2022   GERD (gastroesophageal reflux disease) 02/2014   Herniated nucleus pulposus, cervical 10/23/2016   High risk medication use 01/29/2018   Hyperlipidemia    Hypertension    Iron deficiency anemia    Lumbar back pain    Lumbar radiculopathy 01/26/2019   Mixed dyslipidemia 05/25/2020   Multiple sclerosis (HCC) 12/22/2014   Overview:  JC virus antibody positive  with index 2.67- checked 12/2014   Muscle spasm    Nausea 08/23/2018   Numbness and tingling 04/11/2014   Obesity (BMI 35.0-39.9 without comorbidity) 05/25/2020   Other headache syndrome 01/29/2018   Pes anserinus bursitis of right knee 07/24/2014   Formatting of this note might be different from the original. PT, Life-style modification, and NSAID PRN.   Prediabetes 10/18/2021   PTSD (post-traumatic stress disorder)    Raynaud's disease  06/05/2014   RBC microcytosis    Spinal stenosis in cervical region 10/23/2016   Trochanteric bursitis of right hip 07/13/2014   Formatting of this note might be different from the original. Evaluated by Ortho.  Tx with CSI and PT.   Urinary urgency 01/29/2018   Vision abnormalities    Vitamin D deficiency 07/28/2014   Formatting of this note might be different from the original. Daily supplement.    Past Surgical History:  Procedure Laterality Date   ABDOMINAL HYSTERECTOMY     uterus and cervix removed but still has ovaries   CESAREAN SECTION  x 2   COLONOSCOPY  12/28/2009   Mild sigmoid diverticulosis. Small internal hemorrhoids.   ESOPHAGOGASTRODUODENOSCOPY  12/28/2009   Mild gastroduodenitis. Otherwise normal EGD   LEFT HEART CATH AND CORONARY ANGIOGRAPHY N/A 06/08/2020   Procedure: LEFT HEART CATH AND CORONARY ANGIOGRAPHY;  Surgeon: Kathleene Hazel, MD;  Location: [facility];  Service: Cardiovascular;  Laterality: N/A;   lumbar laminectomy Left 11/02/2016   TONSILLECTOMY     WISDOM TOOTH EXTRACTION      Family History  Problem Relation Age of Onset   Stroke Mother    Cardiomyopathy Mother    Hypertension Mother    Diabetes Mother    Hypertension Father    ADD / ADHD Father    Depression Father    Kidney disease Father        Stage 3   Diabetes Brother     Social History   Socioeconomic History   Marital status: Married    Spouse name: Not on file   Number of children: 2   Years of education: Not on file   Highest education level: Associate degree: academic program  Occupational History   Not on file  Tobacco Use   Smoking status: Former    Types: Cigarettes    Quit date: 2005    Years since quitting: 19.4   Smokeless tobacco: Never  Vaping Use   Vaping Use: Never used  Substance and Sexual Activity   Alcohol use: No   Drug use: Yes    Types: Marijuana   Sexual activity: Yes    Birth control/protection: Surgical  Other Topics  Concern   Not on file  Social History Narrative   One son, one daughter   Associates degree in business   One son in the army and one daughter   2 grand daughters   Married   Enjoys, reading, walking, stationary bike   Social Determinants of Corporate investment banker Strain: Not on file  Food Insecurity: Not on file  Transportation Needs: Not on file  Physical Activity: Not on file  Stress: Not on file  Social Connections: Not on file  Intimate Partner Violence: Not on file    Outpatient Medications Prior to Visit  Medication Sig Dispense Refill   albuterol (VENTOLIN HFA) 108 (90 Base) MCG/ACT inhaler Inhale 2 puffs into the lungs every 6 (six) hours as needed for wheezing or shortness of breath. 54 g 1  ALPRAZolam (XANAX) 0.5 MG tablet Take 0.5 mg by mouth as needed for anxiety.     amitriptyline (ELAVIL) 50 MG tablet TAKE 1 TABLET BY MOUTH EVERYDAY AT BEDTIME 90 tablet 1   aspirin EC 81 MG tablet Take 1 tablet (81 mg total) by mouth daily. Swallow whole. 90 tablet 3   atorvastatin (LIPITOR) 20 MG tablet TAKE 1 TABLET BY MOUTH EVERY DAY 90 tablet 1   esomeprazole (NEXIUM) 40 MG capsule Take 1 capsule (40 mg total) by mouth daily at 12 noon. 90 capsule 4   Ferrous Sulfate (IRON PO) Take 1 Dose by mouth daily.     hydrOXYzine (ATARAX) 25 MG tablet TAKE 1 TABLET BY MOUTH 3 TIMES DAILY AS NEEDED FOR ITCHING. 270 tablet 2   ipratropium-albuterol (DUONEB) 0.5-2.5 (3) MG/3ML SOLN Take 3 mLs by nebulization every 6 (six) hours as needed (wheezing). 360 mL 1   isosorbide mononitrate (IMDUR) 30 MG 24 hr tablet Take 1 tablet (30 mg total) by mouth daily. 90 tablet 3   methocarbamol (ROBAXIN) 500 MG tablet Take 1 tablet (500 mg total) by mouth every 6 (six) hours as needed for muscle spasms. 360 tablet 0   metoprolol succinate (TOPROL-XL) 25 MG 24 hr tablet Take 1 tablet (25 mg total) by mouth daily. 90 tablet 1   montelukast (SINGULAIR) 10 MG tablet TAKE 1 TABLET BY MOUTH EVERYDAY AT  BEDTIME 90 tablet 1   Multiple Vitamin (MULTIVITAMIN ADULT PO) Take 1 tablet by mouth daily.     NIFEdipine (PROCARDIA-XL/NIFEDICAL-XL) 30 MG 24 hr tablet Take 1 tablet (30 mg total) by mouth daily. 90 tablet 1   nitroGLYCERIN (NITROSTAT) 0.4 MG SL tablet Place 1 tablet (0.4 mg total) under the tongue every 5 (five) minutes as needed for chest pain. 25 tablet 6   ondansetron (ZOFRAN) 4 MG tablet Take 1 tablet (4 mg total) by mouth daily as needed for nausea or vomiting. 90 tablet 2   Teriflunomide 7 MG TABS Take 1 tablet by mouth daily. 30 tablet 11   topiramate (TOPAMAX) 25 MG tablet Take 25 mg by mouth 2 (two) times daily.     triamcinolone cream (KENALOG) 0.1 % Apply 1 Application topically 2 (two) times daily. 30 g 1   zolpidem (AMBIEN) 5 MG tablet Take 5 mg by mouth at bedtime.     diclofenac (VOLTAREN) 25 MG EC tablet Take 2 tablets (50 mg total) by mouth 2 (two) times daily. 120 tablet 1   trospium (SANCTURA) 20 MG tablet Take 1 tablet (20 mg total) by mouth 2 (two) times daily. 180 tablet 2   No facility-administered medications prior to visit.    Allergies  Allergen Reactions   Gabapentin Other (See Comments)    Change in Mental attitude   Latex Itching    Power   Meloxicam Nausea Only   Oxycodone Hcl Itching and Nausea Only   Percocet [Oxycodone-Acetaminophen] Nausea Only   Prozac [Fluoxetine Hcl] Other (See Comments)    Mental Changes   Tecfidera [Dimethyl Fumarate]     Body shut down, constipation   Tramadol Itching   Zoloft [Sertraline] Other (See Comments)    Caused Heart Porblems   Buspirone Hcl Palpitations   Hydrochlorothiazide Palpitations   Penicillins Itching and Rash    Reaction: In her 20's    ROS See HPI    Objective:    Physical Exam Constitutional:      Appearance: She is well-developed.  Cardiovascular:     Rate and Rhythm: Normal  rate and regular rhythm.     Heart sounds: Normal heart sounds. No murmur heard. Pulmonary:     Effort: Pulmonary  effort is normal. No respiratory distress.     Breath sounds: Normal breath sounds. No wheezing.  Musculoskeletal:     Comments: Pt demonstrated flexibility of fingers/wrist  Psychiatric:        Speech: Speech is rapid and pressured and tangential.        Behavior: Behavior normal.        Thought Content: Thought content normal.        Judgment: Judgment normal.      BP 121/79 (BP Location: Left Arm, Patient Position: Sitting, Cuff Size: Large)   Pulse (!) 105   Temp 98.2 F (36.8 C) (Oral)   Wt 221 lb (100.2 kg)   SpO2 98%   BMI 36.78 kg/m  Wt Readings from Last 3 Encounters:  09/29/22 221 lb (100.2 kg)  09/24/22 222 lb (100.7 kg)  09/08/22 216 lb 6.4 oz (98.2 kg)       Assessment & Plan:   Problem List Items Addressed This Visit       Unprioritized   Prediabetes    Lab Results  Component Value Date   HGBA1C 6.2 05/26/2022        Joint laxity    Patient reports joint hypermobility diagnosed by Dr. Herma Carson at [facility]. Experiencing dislocations in hips, shoulder, and knee. Concerns about potential Ehlers-Danlos syndrome. -Referred to genetics for further evaluation. -Continue follow-up with orthopedics for joint concerns.      Relevant Orders   Ambulatory referral to Genetics   Iron deficiency anemia    Lab Results  Component Value Date   WBC 6.8 09/24/2022   HGB 12.0 09/24/2022   HCT 38.5 09/24/2022   MCV 79.5 09/24/2022   PLT 297.0 09/24/2022  Blood count stable.       Essential hypertension    BP Readings from Last 3 Encounters:  09/29/22 121/79  09/24/22 126/76  09/08/22 (!) 135/92  BP stable on nifedipine and Toprol xl.       Asthma    Having daily symptoms. Will add advair.  Continue nebs/albuterol.       Anxiety and depression    She has taken multiple medications and they make her more aggitated/hyper. Followed by [facility].        Allergic rhinitis - Primary    Uncontrolled. Add claritin 10mg .  Once daily.       Other  Visit Diagnoses     Borderline diabetes       Relevant Orders   HgB A1c (Completed)       I have discontinued Katelinn Coopman's diclofenac and trospium. I am also having her maintain her aspirin EC, ALPRAZolam, zolpidem, nitroGLYCERIN, isosorbide mononitrate, topiramate, ipratropium-albuterol, atorvastatin, Multiple Vitamin (MULTIVITAMIN ADULT PO), Ferrous Sulfate (IRON PO), triamcinolone cream, Teriflunomide, NIFEdipine, montelukast, metoprolol succinate, amitriptyline, ondansetron, hydrOXYzine, methocarbamol, albuterol, and esomeprazole.  Meds ordered this encounter  Medications   DISCONTD: fluticasone-salmeterol (ADVAIR HFA) 115-21 MCG/ACT inhaler    Sig: Inhale 2 puffs into the lungs 2 (two) times daily.    Dispense:  24 each    Refill:  1    Order Specific Question:   Supervising Provider    Answer:   Danise Edge A [id number]

## 2022-09-29 NOTE — Assessment & Plan Note (Signed)
BP Readings from Last 3 Encounters:  09/29/22 121/79  09/24/22 126/76  09/08/22 (!) 135/92   BP stable on nifedipine and Toprol xl.

## 2022-09-29 NOTE — Assessment & Plan Note (Signed)
Lab Results  Component Value Date   WBC 6.8 09/24/2022   HGB 12.0 09/24/2022   HCT 38.5 09/24/2022   MCV 79.5 09/24/2022   PLT 297.0 09/24/2022   Blood count stable.

## 2022-09-29 NOTE — Assessment & Plan Note (Signed)
Having daily symptoms. Will add advair.  Continue nebs/albuterol.

## 2022-09-29 NOTE — Assessment & Plan Note (Signed)
>>  ASSESSMENT AND PLAN FOR ESSENTIAL HYPERTENSION WRITTEN ON 09/29/2022 12:45 PM BY O'SULLIVAN, Addalynne Golding, NP  BP Readings from Last 3 Encounters:  09/29/22 121/79  09/24/22 126/76  09/08/22 (!) 135/92   BP stable on nifedipine and Toprol xl.

## 2022-09-29 NOTE — Assessment & Plan Note (Signed)
She has taken multiple medications and they make her more aggitated/hyper. Followed by [facility].

## 2022-09-29 NOTE — Assessment & Plan Note (Signed)
Uncontrolled. Add claritin 10mg .  Once daily.

## 2022-09-29 NOTE — Assessment & Plan Note (Signed)
Lab Results  Component Value Date   HGBA1C 6.2 05/26/2022

## 2022-09-29 NOTE — Telephone Encounter (Signed)
[facility] just wanted to inform us they only treat genetic issues related to cancer. So they will not be able to see pt.

## 2022-09-30 ENCOUNTER — Encounter: Payer: Self-pay | Admitting: Family

## 2022-09-30 ENCOUNTER — Other Ambulatory Visit: Payer: Self-pay

## 2022-09-30 DIAGNOSIS — M2520 Flail joint, unspecified joint: Secondary | ICD-10-CM

## 2022-09-30 DIAGNOSIS — L309 Dermatitis, unspecified: Secondary | ICD-10-CM

## 2022-09-30 DIAGNOSIS — L237 Allergic contact dermatitis due to plants, except food: Secondary | ICD-10-CM

## 2022-09-30 HISTORY — DX: Flail joint, unspecified joint: M25.20

## 2022-09-30 MED ORDER — FLUTICASONE-SALMETEROL 250-50 MCG/ACT IN AEPB: 1.0000 | INHALATION_SPRAY | Freq: Two times a day (BID) | RESPIRATORY_TRACT | 1 refills | Status: DC

## 2022-09-30 NOTE — Assessment & Plan Note (Signed)
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 Experiencing dislocations in hips, shoulder, and knee. Concerns about potential Ehlers-Danlos syndrome. -Referred to genetics for further evaluation. -Continue follow-up with orthopedics for joint concerns.

## 2022-09-30 NOTE — Patient Instructions (Signed)
VISIT SUMMARY:  During your visit, we discussed your joint hypermobility, allergic rhinitis, asthma, arthritis, abdominal pain, and bipolar disorder. We also discussed your general health maintenance.  YOUR PLAN:  -JOINT HYPERMOBILITY: This is a condition where your joints move more than they should. We have referred you to a genetics specialist for further evaluation and you should continue to see your orthopedic doctor for your joint concerns.  -ALLERGIC RHINITIS: This is a type of inflammation in the nose which occurs when the immune system overreacts to allergens in the air. We have added Claritin to your daily medication for the next month to help manage your symptoms.  -ASTHMA: This is a condition in which your airways narrow and swell and may produce extra mucus. We added Advair as a maintenance inhaler to reduce your need for a rescue inhaler. Remember to gargle with warm water after each puff to prevent a fungal infection in your mouth.  -ARTHRITIS: This is a condition that causes pain and inflammation in a joint. You should continue to use Diclofenac as needed for your arthritis pain.  -ABDOMINAL PAIN: We are planning for a colonoscopy, endoscopy, CT scan, and barium test with Dr. Chales Abrahams to find the cause of your abdominal pain.  -BIPOLAR DISORDER: This is a disorder associated with episodes of mood swings ranging from depressive lows to manic highs. You should continue to follow up with Behavioral Health.  -GENERAL HEALTH MAINTENANCE: We checked your A1C levels due to borderline results 4 months ago. We also recommend a one-time HIV screening. Please follow up in 4 months.  INSTRUCTIONS:  Please make sure to schedule your appointments with the genetics specialist and Dr. Chales Abrahams. Start taking Claritin daily and consider adding Advair to your asthma treatment. Continue using Diclofenac as needed for arthritis pain. Continue to follow up with Behavioral Health for your bipolar disorder.  We will check your A1C levels today and recommend a one-time HIV screening. Please follow up in 4 months.

## 2022-10-03 ENCOUNTER — Encounter: Payer: Self-pay | Admitting: Family

## 2022-10-06 ENCOUNTER — Ambulatory Visit (HOSPITAL_COMMUNITY)
Admission: RE | Admit: 2022-10-06 | Discharge: 2022-10-06 | Disposition: A | Discharge status: Home / Self Care | Payer: Medicare Other | Source: Ambulatory Visit | Attending: Gastroenterology | Admitting: Gastroenterology

## 2022-10-06 ENCOUNTER — Encounter (HOSPITAL_COMMUNITY): Payer: Self-pay

## 2022-10-06 DIAGNOSIS — K219 Gastro-esophageal reflux disease without esophagitis: Secondary | ICD-10-CM | POA: Insufficient documentation

## 2022-10-06 DIAGNOSIS — R1084 Generalized abdominal pain: Secondary | ICD-10-CM | POA: Insufficient documentation

## 2022-10-06 DIAGNOSIS — K581 Irritable bowel syndrome with constipation: Secondary | ICD-10-CM | POA: Diagnosis present

## 2022-10-06 DIAGNOSIS — R1310 Dysphagia, unspecified: Secondary | ICD-10-CM | POA: Insufficient documentation

## 2022-10-06 MED ORDER — IOHEXOL 9 MG/ML PO SOLN
1000.0000 mL | Freq: Once | ORAL | Status: AC
  Administered 2022-10-06: 1000 mL via ORAL

## 2022-10-06 MED ORDER — IOHEXOL 9 MG/ML PO SOLN
ORAL | Status: AC
  Filled 2022-10-06: qty 1000

## 2022-10-06 MED ORDER — IOHEXOL 300 MG/ML  SOLN
100.0000 mL | Freq: Once | INTRAMUSCULAR | Status: AC | PRN
  Administered 2022-10-06: 100 mL via INTRAVENOUS

## 2022-10-06 MED ORDER — SODIUM CHLORIDE (PF) 0.9 % IJ SOLN
INTRAMUSCULAR | Status: AC
  Filled 2022-10-06: qty 50

## 2022-10-06 NOTE — Telephone Encounter (Signed)
Please continue taking MiraLAX 17 g p.o. daily If it is too strong, then do it every other day. RG

## 2022-10-07 ENCOUNTER — Telehealth: Payer: Self-pay | Admitting: Family

## 2022-10-07 DIAGNOSIS — M2520 Flail joint, unspecified joint: Secondary | ICD-10-CM

## 2022-10-07 NOTE — Telephone Encounter (Signed)
See order.

## 2022-10-08 NOTE — Progress Notes (Signed)
Please inform the patient. No acute inflammatory processes. 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  Appears to be benign Radiology recommends MRI with and without contrast in 3 months Send report to family physician

## 2022-10-10 ENCOUNTER — Ambulatory Visit (HOSPITAL_COMMUNITY)
Admission: RE | Admit: 2022-10-10 | Discharge: 2022-10-10 | Disposition: A | Discharge status: Home / Self Care | Payer: Medicare Other | Source: Ambulatory Visit | Attending: Gastroenterology | Admitting: Gastroenterology

## 2022-10-10 DIAGNOSIS — K219 Gastro-esophageal reflux disease without esophagitis: Secondary | ICD-10-CM | POA: Diagnosis present

## 2022-10-10 DIAGNOSIS — R1310 Dysphagia, unspecified: Secondary | ICD-10-CM

## 2022-10-10 DIAGNOSIS — K581 Irritable bowel syndrome with constipation: Secondary | ICD-10-CM | POA: Insufficient documentation

## 2022-10-10 DIAGNOSIS — R1084 Generalized abdominal pain: Secondary | ICD-10-CM

## 2022-10-12 ENCOUNTER — Encounter: Payer: Self-pay | Admitting: Neurology

## 2022-10-12 NOTE — Progress Notes (Signed)
Please inform the patient. Barium swallow with small hiatal hernia.  Schatzki's ring.  Barium tablet did pass after temporary delay Proceed with EGD with Dil/colonoscopy as per last clinic note Send report to family physician

## 2022-10-13 ENCOUNTER — Telehealth: Payer: Self-pay | Admitting: Gastroenterology

## 2022-10-13 NOTE — Telephone Encounter (Signed)
Inbound call from patient returning phone call regarding recent imaging results. Please advise, thank you.  

## 2022-10-13 NOTE — Telephone Encounter (Signed)
Pt was made aware of recent results and Dr. Gupta recommendations: Pt verbalized understanding with all questions answered.   

## 2022-10-14 ENCOUNTER — Other Ambulatory Visit: Payer: Self-pay

## 2022-10-16 ENCOUNTER — Ambulatory Visit: Payer: Medicare Other | Admitting: Orthopaedic Surgery

## 2022-10-27 ENCOUNTER — Encounter: Payer: Self-pay | Admitting: Neurology

## 2022-10-31 ENCOUNTER — Encounter: Payer: Self-pay | Admitting: Gastroenterology

## 2022-10-31 ENCOUNTER — Ambulatory Visit (AMBULATORY_SURGERY_CENTER): Payer: Medicare Other | Admitting: Gastroenterology

## 2022-10-31 VITALS — BP 108/62 | HR 57 | Temp 98.6°F | Resp 14 | Ht 65.0 in | Wt 222.0 lb

## 2022-10-31 DIAGNOSIS — K222 Esophageal obstruction: Secondary | ICD-10-CM | POA: Diagnosis not present

## 2022-10-31 DIAGNOSIS — D124 Benign neoplasm of descending colon: Secondary | ICD-10-CM | POA: Diagnosis not present

## 2022-10-31 DIAGNOSIS — D123 Benign neoplasm of transverse colon: Secondary | ICD-10-CM | POA: Diagnosis not present

## 2022-10-31 DIAGNOSIS — Z1211 Encounter for screening for malignant neoplasm of colon: Secondary | ICD-10-CM

## 2022-10-31 DIAGNOSIS — K229 Disease of esophagus, unspecified: Secondary | ICD-10-CM | POA: Diagnosis not present

## 2022-10-31 DIAGNOSIS — K219 Gastro-esophageal reflux disease without esophagitis: Secondary | ICD-10-CM

## 2022-10-31 DIAGNOSIS — K2950 Unspecified chronic gastritis without bleeding: Secondary | ICD-10-CM | POA: Diagnosis not present

## 2022-10-31 DIAGNOSIS — K581 Irritable bowel syndrome with constipation: Secondary | ICD-10-CM

## 2022-10-31 MED ORDER — PANTOPRAZOLE SODIUM 40 MG PO TBEC: 40.0000 mg | DELAYED_RELEASE_TABLET | Freq: Every day | ORAL | 3 refills | Status: DC

## 2022-10-31 MED ORDER — SODIUM CHLORIDE 0.9 % IV SOLN
500.0000 mL | INTRAVENOUS | Status: DC
Start: 2022-10-31 — End: 2022-10-31

## 2022-10-31 NOTE — Patient Instructions (Addendum)
Follow dilatation diet ( given to you today) today  Await gastric biopsy results  Continue present medications / nexium d/c'd ,Protonix 40 mg daily ordered per Dr  Chales Abrahams  Handout on colon polyps ,diverticulosis ,& hemorrhoids given to you today  Await pathology results on colon polyp removed & on nodule biopsies done   Continue Miralax  Proceed with MRI of abdomen in October   Minimize non steroidal antiinflammatory medications     YOU HAD AN ENDOSCOPIC PROCEDURE TODAY AT THE [facility]:   Refer to the procedure report that was given to you for any specific questions about what was found during the examination.  If the procedure report does not answer your questions, please call your gastroenterologist to clarify.  If you requested that your care partner not be given the details of your procedure findings, then the procedure report has been included in a sealed envelope for you to review at your convenience later.  YOU SHOULD EXPECT: Some feelings of bloating in the abdomen. Passage of more gas than usual.  Walking can help get rid of the air that was put into your GI tract during the procedure and reduce the bloating. If you had a lower endoscopy (such as a colonoscopy or flexible sigmoidoscopy) you may notice spotting of blood in your stool or on the toilet paper. If you underwent a bowel prep for your procedure, you may not have a normal bowel movement for a few days.  Please Note:  You might notice some irritation and congestion in your nose or some drainage.  This is from the oxygen used during your procedure.  There is no need for concern and it should clear up in a day or so.  SYMPTOMS TO REPORT IMMEDIATELY:  Following lower endoscopy (colonoscopy or flexible sigmoidoscopy):  Excessive amounts of blood in the stool  Significant tenderness or worsening of abdominal pains  Swelling of the abdomen that is new, acute  Fever of 100F or higher  Following upper  endoscopy (EGD)  Vomiting of blood or coffee ground material  New chest pain or pain under the shoulder blades  Painful or persistently difficult swallowing  New shortness of breath  Fever of 100F or higher  Black, tarry-looking stools  For urgent or emergent issues, a gastroenterologist can be reached at any hour by calling [phone]. Do not use MyChart messaging for urgent concerns.    DIET:  We do recommend a small meal at first, but then you may proceed to your regular diet.  Drink plenty of fluids but you should avoid alcoholic beverages for 24 hours.  ACTIVITY:  You should plan to take it easy for the rest of today and you should NOT DRIVE or use heavy machinery until tomorrow (because of the sedation medicines used during the test).    FOLLOW UP: Our staff will call the number listed on your records the next business day following your procedure.  We will call around 7:15- 8:00 am to check on you and address any questions or concerns that you may have regarding the information given to you following your procedure. If we do not reach you, we will leave a message.     If any biopsies were taken you will be contacted by phone or by letter within the next 1-3 weeks.  Please call us at [phone] if you have not heard about the biopsies in 3 weeks.    SIGNATURES/CONFIDENTIALITY: You and/or your care partner have signed  paperwork which will be entered into your electronic medical record.  These signatures attest to the fact that that the information above on your After Visit Summary has been reviewed and is understood.  Full responsibility of the confidentiality of this discharge information lies with you and/or your care-partner.

## 2022-10-31 NOTE — Progress Notes (Signed)
During EGD, patient spo2 decreased. FiO2 increased and oropharynx suctioned. Nothing gastric noted upon suctioning. Transient drop in spo2.   Report to pacu rn. Vss. Care resumed by rn.

## 2022-10-31 NOTE — Op Note (Signed)
[facility] Patient Name: Renee Brady Procedure Date: 10/31/2022 2:00 PM MRN: [id number] Endoscopist: Lynann Bologna , MD, [id number] Age: 47 Referring MD:  Date of Birth: 1975/09/13 Gender: Female Account #: [id number] Procedure:                Colonoscopy Indications:              Screening for colorectal malignant neoplasm Medicines:                Monitored Anesthesia Care Procedure:                Pre-Anesthesia Assessment:                           - Prior to the procedure, a History and Physical                            was performed, and patient medications and                            allergies were reviewed. The patient's tolerance of                            previous anesthesia was also reviewed. The risks                            and benefits of the procedure and the sedation                            options and risks were discussed with the patient.                            All questions were answered, and informed consent                            was obtained. Prior Anticoagulants: The patient has                            taken no anticoagulant or antiplatelet agents. ASA                            Grade Assessment: II - A patient with mild systemic                            disease. After reviewing the risks and benefits,                            the patient was deemed in satisfactory condition to                            undergo the procedure.                           After obtaining informed consent, the colonoscope  was passed under direct vision. Throughout the                            procedure, the patient's blood pressure, pulse, and                            oxygen saturations were monitored continuously. The                            Olympus CF-HQ190L ([id number]) Colonoscope was                            introduced through the anus and advanced to the 4                            cm into the ileum.  The colonoscopy was performed                            without difficulty. The patient tolerated the                            procedure well. The quality of the bowel                            preparation was good. The terminal ileum, ileocecal                            valve, appendiceal orifice, and rectum were                            photographed. Scope In: 2:25:56 PM Scope Out: 2:41:55 PM Scope Withdrawal Time: 0 hours 13 minutes 6 seconds  Total Procedure Duration: 0 hours 15 minutes 59 seconds  Findings:                 A 6 mm polyp was found in the mid descending colon.                            The polyp was sessile. The polyp was removed with a                            cold snare. Resection and retrieval were complete.                           Minimal sigmoid diverticulosis. Non-bleeding                            internal hemorrhoids were found during                            retroflexion. The hemorrhoids were small and Grade                            I (internal hemorrhoids that do not prolapse).  A 6 mm nodule was visualized in the terminal ileum                            ? Importance. This was removed using a cold biopsy                            forceps.                           The exam was otherwise without abnormality on                            direct and retroflexion views. Complications:            No immediate complications. Estimated Blood Loss:     Estimated blood loss: none. Impression:               - One 6 mm polyp in the mid descending colon,                            removed with a cold snare. Resected and retrieved.                           - Minimal sigmoid diverticulosis.                           - Small localized TI nodule s/p removal using a                            biopsy forceps                           - The examination was otherwise normal on direct                            and retroflexion  views. Recommendation:           - Patient has a contact number available for                            emergencies. The signs and symptoms of potential                            delayed complications were discussed with the                            patient. Return to normal activities tomorrow.                            Written discharge instructions were provided to the                            patient.                           - Resume previous diet.                           -  Continue present medications including MiraLAX.                           - Await pathology results.                           - Minimize nonsteroidals.                           - Repeat colonoscopy for surveillance based on                            pathology results.                           - Proceed with MRI [place] with and without contrast                            in October 2024 as suggested by radiology                           - The findings and recommendations were discussed                            with the patient's family. Lynann Bologna, MD 10/31/2022 2:49:41 PM This report has been signed electronically.
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
## 2022-10-31 NOTE — Progress Notes (Signed)
Dr Chales Abrahams ordered to change nexium to Protonix 40 mg /day

## 2022-10-31 NOTE — Op Note (Signed)
[facility] Patient Name: Renee Brady Procedure Date: 10/31/2022 2:06 PM MRN: [id number] Endoscopist: Lynann Bologna , MD, [id number] Age: 47 Referring MD:  Date of Birth: 10-Feb-1976 Gender: Female Account #: [id number] Procedure:                Upper GI endoscopy Indications:              Dysphagia. GERD. Medicines:                Monitored Anesthesia Care Procedure:                Pre-Anesthesia Assessment:                           - Prior to the procedure, a History and Physical                            was performed, and patient medications and                            allergies were reviewed. The patient's tolerance of                            previous anesthesia was also reviewed. The risks                            and benefits of the procedure and the sedation                            options and risks were discussed with the patient.                            All questions were answered, and informed consent                            was obtained. Prior Anticoagulants: The patient has                            taken no anticoagulant or antiplatelet agents. ASA                            Grade Assessment: III - A patient with severe                            systemic disease. After reviewing the risks and                            benefits, the patient was deemed in satisfactory                            condition to undergo the procedure.                           After obtaining informed consent, the endoscope was  passed under direct vision. Throughout the                            procedure, the patient's blood pressure, pulse, and                            oxygen saturations were monitored continuously. The                            GIF [id number] #[id number] was introduced through the                            mouth, and advanced to the second part of duodenum.                            The upper GI endoscopy was  accomplished without                            difficulty. The patient tolerated the procedure                            well. Scope In: Scope Out: Findings:                 A non-obstructing and moderate Schatzki ring was                            found at the gastroesophageal junction, 38 cm from                            the incisors with luminal diameter of approximately                            14mm. Biopsies were taken with a cold forceps for                            histology. The scope was withdrawn. Dilation was                            performed with a Maloney dilator with mild                            resistance at 50 Fr and 52 Fr.                           A small hiatal hernia was present. Gastric mucosa                            was otherwise unremarkable. Multiple biopsies were                            obtained from the antrum and body of the stomach to  rule out H. pylori.                           The examined duodenum was normal. Biopsies for                            histology were taken with a cold forceps for                            evaluation of celiac disease. Complications:            No immediate complications. Estimated Blood Loss:     Estimated blood loss: none. Impression:               - Non-obstructing and moderate Schatzki ring.                            Biopsied. Dilated.                           - Small hiatal hernia.                           - Normal examined duodenum. Biopsied. Recommendation:           - Patient has a contact number available for                            emergencies. The signs and symptoms of potential                            delayed complications were discussed with the                            patient. Return to normal activities tomorrow.                            Written discharge instructions were provided to the                            patient.                            - Post dilatation diet.                           - Continue present medications including Nexium 40                            mg p.o. daily.                           - Await pathology results.                           - The findings and recommendations were discussed  with the patient's family. Lynann Bologna, MD 10/31/2022 2:44:27 PM This report has been signed electronically.

## 2022-11-03 ENCOUNTER — Other Ambulatory Visit: Payer: Self-pay

## 2022-11-03 ENCOUNTER — Ambulatory Visit: Payer: Medicare Other | Admitting: Physical Medicine and Rehabilitation

## 2022-11-03 ENCOUNTER — Telehealth: Payer: Self-pay | Admitting: *Deleted

## 2022-11-03 MED ORDER — TRIAMCINOLONE ACETONIDE 0.1 % EX CREA
1.0000 | TOPICAL_CREAM | Freq: Two times a day (BID) | CUTANEOUS | 1 refills | Status: DC
Start: 2022-11-03 — End: 2024-02-23

## 2022-11-03 NOTE — Telephone Encounter (Signed)
  Follow up Call-     10/31/2022    1:40 PM  Call back number  Post procedure Call Back phone  # [phone]  Permission to leave phone message Yes     Patient questions:  Do you have a fever, pain , or abdominal swelling? No. Pain Score  0 *  Have you tolerated food without any problems? Yes.    Have you been able to return to your normal activities? Yes.    Do you have any questions about your discharge instructions: Diet   No. Medications  No. Follow up visit  No.  Do you have questions or concerns about your Care? No.  Actions: * If pain score is 4 or above: No action needed, pain <4.

## 2022-11-03 NOTE — Addendum Note (Signed)
Addended by: [name] on: 11/03/2022 07:07 AM   Modules accepted: Orders

## 2022-11-03 NOTE — Telephone Encounter (Signed)
Pt was contacted in regard to previous message. Chart was reviewed and noted. Renee Darling, RN 10/08/2022  3:10 PM EDT     Pt was made aware of recent results and Dr. Chales Abrahams recommendations; Reminder was placed in Epic for three months to schedule MRI with and without contrast. Report was sent to pt PCP Pt verbalized understanding with all questions answered.   Renee Bologna, MD 10/08/2022 10:54 AM EDT     Please inform the patient. No acute inflammatory processes. 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  Appears to be benign Radiology recommends MRI with and without contrast in 3 months Send report to family physician   Pt was notified that I will contact her in three months and set up the MRI.  Pt verbalized understanding with all questions answered.

## 2022-11-06 ENCOUNTER — Encounter: Payer: Self-pay | Admitting: Gastroenterology

## 2022-11-07 NOTE — Addendum Note (Signed)
Addended by: [name] on: 11/07/2022 07:12 AM   Modules accepted: Orders

## 2022-11-13 ENCOUNTER — Encounter: Payer: Self-pay | Admitting: Gastroenterology

## 2022-11-14 NOTE — Telephone Encounter (Signed)
Chart updated per patient request

## 2022-12-08 ENCOUNTER — Encounter: Payer: Self-pay | Admitting: Gastroenterology

## 2022-12-10 NOTE — Telephone Encounter (Signed)
PT is calling to get an update on MRI order so she can schedule. She is under the impression it was to be done 10/3. Please advise

## 2022-12-11 ENCOUNTER — Other Ambulatory Visit: Payer: Self-pay

## 2022-12-11 DIAGNOSIS — R9389 Abnormal findings on diagnostic imaging of other specified body structures: Secondary | ICD-10-CM

## 2022-12-11 DIAGNOSIS — R109 Unspecified abdominal pain: Secondary | ICD-10-CM

## 2022-12-15 ENCOUNTER — Other Ambulatory Visit: Payer: Self-pay | Admitting: Cardiology

## 2022-12-15 DIAGNOSIS — E782 Mixed hyperlipidemia: Secondary | ICD-10-CM

## 2022-12-29 ENCOUNTER — Other Ambulatory Visit: Payer: Self-pay | Admitting: Internal Medicine

## 2022-12-29 ENCOUNTER — Encounter: Payer: Self-pay | Admitting: Cardiology

## 2022-12-29 ENCOUNTER — Ambulatory Visit (INDEPENDENT_AMBULATORY_CARE_PROVIDER_SITE_OTHER): Payer: Medicare Other | Admitting: Internal Medicine

## 2022-12-29 ENCOUNTER — Encounter: Payer: Self-pay | Admitting: Internal Medicine

## 2022-12-29 ENCOUNTER — Telehealth: Payer: Self-pay | Admitting: Internal Medicine

## 2022-12-29 VITALS — BP 116/78 | HR 85 | Temp 98.2°F | Resp 16 | Ht 65.0 in | Wt 223.0 lb

## 2022-12-29 DIAGNOSIS — J3089 Other allergic rhinitis: Secondary | ICD-10-CM | POA: Diagnosis not present

## 2022-12-29 DIAGNOSIS — H1045 Other chronic allergic conjunctivitis: Secondary | ICD-10-CM | POA: Diagnosis not present

## 2022-12-29 DIAGNOSIS — J4530 Mild persistent asthma, uncomplicated: Secondary | ICD-10-CM | POA: Diagnosis not present

## 2022-12-29 DIAGNOSIS — J302 Other seasonal allergic rhinitis: Secondary | ICD-10-CM

## 2022-12-29 DIAGNOSIS — W57XXXA Bitten or stung by nonvenomous insect and other nonvenomous arthropods, initial encounter: Secondary | ICD-10-CM

## 2022-12-29 DIAGNOSIS — M2520 Flail joint, unspecified joint: Secondary | ICD-10-CM

## 2022-12-29 MED ORDER — CROMOLYN SODIUM 4 % OP SOLN: 1.0000 [drp] | Freq: Four times a day (QID) | OPHTHALMIC | 1 refills | Status: DC

## 2022-12-29 MED ORDER — BUDESONIDE-FORMOTEROL FUMARATE 80-4.5 MCG/ACT IN AERO: 2.0000 | INHALATION_SPRAY | Freq: Two times a day (BID) | RESPIRATORY_TRACT | 12 refills | Status: DC

## 2022-12-29 MED ORDER — AZELASTINE HCL 0.1 % NA SOLN: 1.0000 | Freq: Two times a day (BID) | NASAL | 5 refills | Status: AC | PRN

## 2022-12-29 MED ORDER — BUDESONIDE-FORMOTEROL FUMARATE 80-4.5 MCG/ACT IN AERO: 2.0000 | INHALATION_SPRAY | Freq: Two times a day (BID) | RESPIRATORY_TRACT | 1 refills | Status: DC

## 2022-12-29 MED ORDER — CROMOLYN SODIUM 4 % OP SOLN: 1.0000 [drp] | Freq: Four times a day (QID) | OPHTHALMIC | 12 refills | Status: DC
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
## 2022-12-29 NOTE — Telephone Encounter (Signed)
Patient called and stated her medications need to be 90 days and requested a call back.

## 2022-12-29 NOTE — Patient Instructions (Signed)
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 - But based on symptoms asthma is not well controlled   PLAN:  - Spacer sample and demonstration provided. - Controller Inhaler: Start Symbicort  1 puff twice a day; This Should Be Used Everyday - Rinse mouth out after use - Rescue Inhaler: Albuterol (Proair/Ventolin) 2 puffs . Use  every 4-6 hours as needed for chest tightness, wheezing, or coughing.  Can also use 15 minutes prior to exercise if you have symptoms with activity. - Asthma is not controlled if:  - Symptoms are occurring >2 times a week OR  - >2 times a month nighttime awakenings  - You are requiring systemic steroids (prednisone/steroid injections) more than once per year  - Your require hospitalization for your asthma.  - Please call the clinic to schedule a follow up if these symptoms arise   Chronic Rhinitis Seasonal and Perennial Allergic with some nonallergic triggers (strong odors): not well controlled  - allergy testing today: positive to grass, weed, tree, mold, dog   - Prevention:  - allergen avoidance when possible - consider allergy shots as long term control of your symptoms by teaching your immune system to be more tolerant of your allergy triggers  - Symptom control: - Start Nasal Steroid Spray: Best results if used daily. - Options include Flonase (fluticasone), Nasocort (triamcinolone), Nasonex (mometasome) 1- 2 sprays in each nostril daily.  - All can be purchased over-the-counter if not covered by insurance. - Start Astelin (azelastine) 1-2 sprays in each nostril twice a day as needed for nasal congestion/itchy nose - Continue Singulair (Montelukast) 10mg  nightly.   - Discontinue if nightmares of behavior changes. - Continue Antihistamine: daily or daily as needed.   -Options include Zyrtec (Cetirizine) 10mg , Claritin (Loratadine) 10mg , Allegra (Fexofenadine) 180mg , or Xyzal (Levocetirinze) 5mg  - Can be  purchased over-the-counter if not covered by insurance.  Allergic Conjunctivitis:  - Start Allergy Eye drops-Cromolyn-1 drop each eye up to 4 times daily as needed and Rewetting Drops such as Systane,TheraTears, etc  -Avoid eye drops that say red eye relief as they may contain medications that dry out your eyes.   Follow up: 3 months   Thank you so much for letting me partake in your care today.  Don't hesitate to reach out if you have any additional concerns!  Ferol Luz, MD  [facility] [place], [place]   Reducing Pollen Exposure  The American Academy of Allergy, Asthma and Immunology suggests the following steps to reduce your exposure to pollen during allergy seasons.    Do not hang sheets or clothing out to dry; pollen may collect on these items. Do not mow lawns or spend time around freshly cut grass; mowing stirs up pollen. Keep windows closed at night.  Keep car windows closed while driving. Minimize morning activities outdoors, a time when pollen counts are usually at their highest. Stay indoors as much as possible when pollen counts or humidity is high and on windy days when pollen tends to remain in the air longer. Use air conditioning when possible.  Many air conditioners have filters that trap the pollen spores. Use a HEPA room air filter to remove pollen form the indoor air you breathe.  Control of Dog or Cat Allergen  Avoidance is the best way to manage a dog or cat allergy. If you have a dog or cat and are allergic to dog or cats, consider removing the dog  or cat from the home. If you have a dog or cat but don't want to find it a new home, or if your family wants a pet even though someone in the household is allergic, here are some strategies that may help keep symptoms at bay:  Keep the pet out of your bedroom and restrict it to only a few rooms. Be advised that keeping the dog or cat in only one room will not limit the allergens to that  room. Don't pet, hug or kiss the dog or cat; if you do, wash your hands with soap and water. High-efficiency particulate air (HEPA) cleaners run continuously in a bedroom or living room can reduce allergen levels over time. Regular use of a high-efficiency vacuum cleaner or a central vacuum can reduce allergen levels. Giving your dog or cat a bath at least once a week can reduce airborne allergen.  Control of Mold Allergen   Mold and fungi can grow on a variety of surfaces provided certain temperature and moisture conditions exist.  Outdoor molds grow on plants, decaying vegetation and soil.  The major outdoor mold, Alternaria and Cladosporium, are found in very high numbers during hot and dry conditions.  Generally, a late Summer - Fall peak is seen for common outdoor fungal spores.  Rain will temporarily lower outdoor mold spore count, but counts rise rapidly when the rainy period ends.  The most important indoor molds are Aspergillus and Penicillium.  Dark, humid and poorly ventilated basements are ideal sites for mold growth.  The next most common sites of mold growth are the bathroom and the kitchen.  Outdoor (Seasonal) Mold Control    Use air conditioning and keep windows closed Avoid exposure to decaying vegetation. Avoid leaf raking. Avoid grain handling. Consider wearing a face mask if working in moldy areas.    Indoor (Perennial) Mold Control   Maintain humidity below 50%. Clean washable surfaces with 5% bleach solution. Remove sources e.g. contaminated carpets.

## 2022-12-29 NOTE — Telephone Encounter (Signed)
Informed pt I would send in 90 days for symbicort and cromolyn and if she had any issues to call us

## 2022-12-30 ENCOUNTER — Other Ambulatory Visit: Payer: Self-pay

## 2022-12-30 ENCOUNTER — Encounter: Payer: Self-pay | Admitting: Internal Medicine

## 2022-12-30 MED ORDER — CROMOLYN SODIUM 4 % OP SOLN: 1.0000 [drp] | Freq: Four times a day (QID) | OPHTHALMIC | 1 refills | Status: DC

## 2022-12-30 MED ORDER — NITROGLYCERIN 0.4 MG SL SUBL: 0.4000 mg | SUBLINGUAL_TABLET | SUBLINGUAL | 3 refills | Status: DC | PRN

## 2022-12-30 MED ORDER — DULERA 100-5 MCG/ACT IN AERO: 2.0000 | INHALATION_SPRAY | Freq: Two times a day (BID) | RESPIRATORY_TRACT | 1 refills | Status: DC

## 2022-12-30 MED ORDER — DULERA 100-5 MCG/ACT IN AERO: 2.0000 | INHALATION_SPRAY | Freq: Two times a day (BID) | RESPIRATORY_TRACT | 5 refills | Status: DC

## 2022-12-30 NOTE — Telephone Encounter (Signed)
Lets do dulera 2 puffs twice daily

## 2023-01-05 ENCOUNTER — Encounter: Payer: Self-pay | Admitting: Family

## 2023-01-05 ENCOUNTER — Telehealth: Payer: Self-pay | Admitting: Internal Medicine

## 2023-01-05 NOTE — Telephone Encounter (Signed)
Pt states the Dulera is $160, she would like a substitute for that if possible. Pt also states she had an allergy test 9/23 and she has what looks like a reaction in that area, she says it looks like ring worm. Pt request a call back.

## 2023-01-05 NOTE — Telephone Encounter (Signed)
According to formulary breo is preferred

## 2023-01-06 NOTE — Telephone Encounter (Signed)
Okay we can do Breo 1 puff daily

## 2023-01-07 MED ORDER — FLUTICASONE FUROATE-VILANTEROL 200-25 MCG/ACT IN AEPB: 1.0000 | INHALATION_SPRAY | Freq: Every day | RESPIRATORY_TRACT | 5 refills | Status: DC

## 2023-01-07 NOTE — Telephone Encounter (Signed)
Sent in breo to [facility]

## 2023-01-07 NOTE — Addendum Note (Signed)
Addended by: Berna Bue on: 01/07/2023 08:19 AM   Modules accepted: Orders

## 2023-01-08 ENCOUNTER — Ambulatory Visit (HOSPITAL_COMMUNITY)
Admission: RE | Admit: 2023-01-08 | Discharge: 2023-01-08 | Disposition: A | Discharge status: Home / Self Care | Payer: Medicare Other | Source: Ambulatory Visit | Attending: Gastroenterology | Admitting: Gastroenterology

## 2023-01-08 DIAGNOSIS — R9389 Abnormal findings on diagnostic imaging of other specified body structures: Secondary | ICD-10-CM | POA: Diagnosis present

## 2023-01-08 DIAGNOSIS — R109 Unspecified abdominal pain: Secondary | ICD-10-CM | POA: Insufficient documentation

## 2023-01-08 MED ORDER — GADOBUTROL 1 MMOL/ML IV SOLN
10.0000 mL | Freq: Once | INTRAVENOUS | Status: AC | PRN
  Administered 2023-01-08: 10 mL via INTRAVENOUS

## 2023-01-12 ENCOUNTER — Other Ambulatory Visit: Payer: Self-pay

## 2023-01-12 ENCOUNTER — Encounter: Payer: Medicare Other | Admitting: Internal Medicine

## 2023-01-12 DIAGNOSIS — R109 Unspecified abdominal pain: Secondary | ICD-10-CM

## 2023-01-12 DIAGNOSIS — D3501 Benign neoplasm of right adrenal gland: Secondary | ICD-10-CM

## 2023-01-12 DIAGNOSIS — K769 Liver disease, unspecified: Secondary | ICD-10-CM

## 2023-01-12 DIAGNOSIS — C259 Malignant neoplasm of pancreas, unspecified: Secondary | ICD-10-CM

## 2023-01-12 DIAGNOSIS — R935 Abnormal findings on diagnostic imaging of other abdominal regions, including retroperitoneum: Secondary | ICD-10-CM

## 2023-01-12 DIAGNOSIS — R9389 Abnormal findings on diagnostic imaging of other specified body structures: Secondary | ICD-10-CM

## 2023-01-12 DIAGNOSIS — C229 Malignant neoplasm of liver, not specified as primary or secondary: Secondary | ICD-10-CM

## 2023-01-12 NOTE — Progress Notes (Signed)
Discussed MRI results with patient in detail I also sent message to Dr. Meridee Score and Dr. Freida Busman for their advice  Plan: -Check CBC, CMP, LDH, CA 19-9, CEA, AFP -Will wait for their recommendations Send report to family physician

## 2023-01-13 ENCOUNTER — Other Ambulatory Visit: Payer: Self-pay

## 2023-01-13 ENCOUNTER — Other Ambulatory Visit (INDEPENDENT_AMBULATORY_CARE_PROVIDER_SITE_OTHER): Payer: Medicare Other

## 2023-01-13 DIAGNOSIS — K769 Liver disease, unspecified: Secondary | ICD-10-CM | POA: Diagnosis not present

## 2023-01-13 DIAGNOSIS — R9389 Abnormal findings on diagnostic imaging of other specified body structures: Secondary | ICD-10-CM

## 2023-01-13 DIAGNOSIS — C229 Malignant neoplasm of liver, not specified as primary or secondary: Secondary | ICD-10-CM

## 2023-01-13 DIAGNOSIS — D3501 Benign neoplasm of right adrenal gland: Secondary | ICD-10-CM

## 2023-01-13 DIAGNOSIS — R109 Unspecified abdominal pain: Secondary | ICD-10-CM

## 2023-01-13 DIAGNOSIS — R935 Abnormal findings on diagnostic imaging of other abdominal regions, including retroperitoneum: Secondary | ICD-10-CM

## 2023-01-13 LAB — COMPREHENSIVE METABOLIC PANEL
ALT: 17 U/L (ref 0–35)
AST: 12 U/L (ref 0–37)
Albumin: 3.8 g/dL (ref 3.5–5.2)
Alkaline Phosphatase: 78 U/L (ref 39–117)
BUN: 11 mg/dL (ref 6–23)
CO2: 26 meq/L (ref 19–32)
Calcium: 8.7 mg/dL (ref 8.4–10.5)
Chloride: 104 meq/L (ref 96–112)
Creatinine, Ser: 0.95 mg/dL (ref 0.40–1.20)
GFR: 71.3 mL/min (ref >60.00)
Glucose, Bld: 130 mg/dL — ABNORMAL HIGH (ref 70–99)
Potassium: 4 meq/L (ref 3.5–5.1)
Sodium: 138 meq/L (ref 135–145)
Total Bilirubin: 0.8 mg/dL (ref 0.2–1.2)
Total Protein: 6.6 g/dL (ref 6.0–8.3)

## 2023-01-13 LAB — CBC WITH DIFFERENTIAL/PLATELET
Basophils Absolute: 0.1 10*3/uL (ref 0.0–0.1)
Basophils Relative: 1.3 % (ref 0.0–3.0)
Eosinophils Absolute: 0.1 10*3/uL (ref 0.0–0.7)
Eosinophils Relative: 1.6 % (ref 0.0–5.0)
HCT: 39.4 % (ref 36.0–46.0)
Hemoglobin: 12.1 g/dL (ref 12.0–15.0)
Lymphocytes Relative: 31.6 % (ref 12.0–46.0)
Lymphs Abs: 1.6 10*3/uL (ref 0.7–4.0)
MCHC: 30.8 g/dL (ref 30.0–36.0)
MCV: 78.5 fL (ref 78.0–100.0)
Monocytes Absolute: 0.3 10*3/uL (ref 0.1–1.0)
Monocytes Relative: 5.9 % (ref 3.0–12.0)
Neutro Abs: 3 10*3/uL (ref 1.4–7.7)
Neutrophils Relative %: 59.6 % (ref 43.0–77.0)
Platelets: 299 10*3/uL (ref 150.0–400.0)
RBC: 5.02 Mil/uL (ref 3.87–5.11)
RDW: 14.6 % (ref 11.5–15.5)
WBC: 5 10*3/uL (ref 4.0–10.5)

## 2023-01-14 LAB — AFP TUMOR MARKER: AFP-Tumor Marker: 2.4 ng/mL

## 2023-01-14 LAB — LACTATE DEHYDROGENASE: LDH: 92 U/L — ABNORMAL LOW (ref 100–200)

## 2023-01-14 LAB — EXTRA SPECIMEN

## 2023-01-14 LAB — CEA: CEA: <2 ng/mL

## 2023-01-14 LAB — CANCER ANTIGEN 19-9: CA 19-9: 9 U/mL (ref <34)

## 2023-01-15 ENCOUNTER — Encounter: Payer: Self-pay | Admitting: Gastroenterology

## 2023-01-16 ENCOUNTER — Telehealth: Payer: Self-pay | Admitting: Gastroenterology

## 2023-01-16 NOTE — Telephone Encounter (Signed)
Inbound call from patient requesting a call to discuss recent lab results. Advised patient results have not yet been reviews. Patient states she is scheduled to see a surgeon on Monday. States she would like to know what is going on. Patient requesting a call. Please advise, thank you.

## 2023-01-16 NOTE — Telephone Encounter (Signed)
Refer to phone note 01/16/23. Results & MD recommendations discussed with patient.

## 2023-01-16 NOTE — Telephone Encounter (Signed)
All tumor markers including CA 19-9, CEA, AFP are normal Remaining labs are all normal or baseline. We will wait for appointment with Dr. Doristine Devoid

## 2023-01-16 NOTE — Telephone Encounter (Signed)
Spoke with patient regarding results & recommendations. She is very anxious and concerned that she may have cancer. Provided support to patient & advised her that she will be able to find out more at her appointment next week. Pt verbalized all understanding.

## 2023-01-19 ENCOUNTER — Encounter: Payer: Self-pay | Admitting: Internal Medicine

## 2023-01-19 ENCOUNTER — Ambulatory Visit: Payer: Self-pay | Admitting: Surgery

## 2023-01-19 DIAGNOSIS — R160 Hepatomegaly, not elsewhere classified: Secondary | ICD-10-CM

## 2023-01-19 NOTE — H&P (Signed)
History of Present Illness: Renee Brady is a 47 y.o. female who is seen today as an office consultation for evaluation of New Consultation (LIVER LESION)   She has been having generalized abdominal pain and has been following with GI. She underwent an abdominal CT for further evaluation, which showed a 2.8cm lesion between the right kidney and the right posterior liver. Interval follow up MRI was completed on 10/3. This showed a 2cm mass originating from the right liver, concerning for a malignancy. She has been referred to discuss surgery. She denies any known history of liver disease, has never had hepatitis, and has never consumed EtOH heavily. She has never used oral contraceptives.   Recent LFTs were normal, and tumor markers were as follows: CA19-9: 6 CEA: <2 AFP: 2.4   Previous abdominal surgeries include two C sections and a laparoscopic-assisted hysterectomy.       Review of Systems: A complete review of systems was obtained from the patient.  I have reviewed this information and discussed as appropriate with the patient.  See HPI as well for other ROS.     Medical History: Past Medical History Past Medical History: Diagnosis Date  Anxiety and depression    Asthma, unspecified asthma severity, unspecified whether complicated, unspecified whether persistent (HHS-HCC)    Hypertension        Problem List Patient Active Problem List Diagnosis  ADHD (attention deficit hyperactivity disorder)  Allergic rhinitis  ANA positive  Ankle pain, left  Asthma (HHS-HCC)  BMI 35.0-35.9,adult  Chronic pain due to trauma  Chronic pain of left knee  Anxiety and depression      Past Surgical History Past Surgical History: Procedure Laterality Date  BACK SURGERY      CESAREAN SECTION      WISDOM TEETH          Allergies Allergies Allergen Reactions  Sertraline Anxiety and Palpitations     Similar rxns to all SSRI's in the past.  Dimethyl Fumarate Other (See  Comments)  Fluoxetine Other (See Comments)  Gabapentin Other (See Comments)     Anger, hostility  Sulfamethoxazole Other (See Comments)     Pt unsure of reaction  Tramadol Itching  Latex, Natural Rubber Rash  Meloxicam Rash  Oxycodone-Acetaminophen Rash  Penicillins Rash      Medications Ordered Prior to Encounter Current Outpatient Medications on File Prior to Visit Medication Sig Dispense Refill  ALPRAZolam (XANAX) 0.5 MG tablet Take 0.5 mg by mouth at bedtime as needed for Sleep      amitriptyline (ELAVIL) 150 MG tablet Take 150 mg by mouth nightly        atorvastatin (LIPITOR) 20 MG tablet Take 20 mg by mouth once daily      azelastine (ASTELIN) 137 mcg nasal spray Place 1-2 sprays into one nostril 2 (two) times daily as needed      cholecalciferol (VITAMIN D3) 1000 unit capsule Take 1,000 Units by mouth once daily      cromolyn (CROLOM) 4 % ophthalmic solution Apply 1 drop to eye 4 (four) times daily      methocarbamol (ROBAXIN) 500 MG tablet Take 500 mg by mouth.      metoprolol succinate (TOPROL-XL) 25 MG XL tablet Take 1 tablet by mouth once daily      montelukast (SINGULAIR) 10 mg tablet Take 10 mg by mouth.      niFEdipine (PROCARDIA-XL) 60 MG (OSM) XL tablet Take 60 mg by mouth.      pantoprazole (PROTONIX) 40 MG DR  tablet Take 40 mg by mouth once daily      albuterol 90 mcg/actuation inhaler Inhale into the lungs. (Patient not taking: Reported on 01/19/2023)      isosorbide mononitrate (IMDUR) 30 MG ER tablet Take 1 tablet (30 mg total) by mouth once daily 30 tablet 11    No current facility-administered medications on file prior to visit.      Family History Family History Problem Relation Age of Onset  High blood pressure (Hypertension) Mother    Diabetes Mother    Stroke Mother    High blood pressure (Hypertension) Father    ADD / ADHD Father    Alzheimer's disease Paternal Grandfather        Tobacco Use History Social History    Tobacco Use Smoking  Status Former  Types: Cigarettes Smokeless Tobacco Never      Social History Social History    Socioeconomic History  Marital status: Married Tobacco Use  Smoking status: Former     Types: Cigarettes  Smokeless tobacco: Never Vaping Use  Vaping status: Never Used Substance and Sexual Activity  Alcohol use: Not Currently  Drug use: Yes     Comment: THC      Objective:   Vitals:   01/19/23 0958 BP: 126/87 Pulse: 81 Temp: 36.8 C (98.2 F) SpO2: 98% Weight: (!) 105.1 kg (231 lb 9.6 oz) Height: 165.1 cm (5\' 5" ) PainSc:   7   Body mass index is 38.54 kg/m.   Physical Exam Vitals reviewed.  Constitutional:      General: She is not in acute distress.    Appearance: Normal appearance.  HENT:     Head: Normocephalic and atraumatic.  Eyes:     General: No scleral icterus.    Conjunctiva/sclera: Conjunctivae normal.  Cardiovascular:     Rate and Rhythm: Normal rate and regular rhythm.     Heart sounds: No murmur heard. Pulmonary:     Effort: Pulmonary effort is normal. No respiratory distress.     Breath sounds: Normal breath sounds. No wheezing.  Abdominal:     General: There is no distension.     Palpations: Abdomen is soft.     Tenderness: There is no abdominal tenderness.     Comments: Well-healed Pfannenstiel incision and port site scars.  Skin:    General: Skin is warm and dry.     Coloration: Skin is not jaundiced.  Neurological:     General: No focal deficit present.     Mental Status: She is alert and oriented to person, place, and time.            Labs, Imaging and Diagnostic Testing: MRI abdomen 01/08/23: IMPRESSION: 1. Exophytic 2 cm segment VI hepatic lesion is concerning for hepatic malignancy with primary differential consideration of intrahepatic cholangiocarcinoma. 2. 7 mm right adrenal adenoma.     Assessment and Plan:   Assessment Diagnoses and all orders for this visit:   Liver mass, right lobe     This is a 47 yo female  with an incidental mass in the right posterior liver. I personally reviewed her labs, imaging and referral notes. She has an approximately 2cm exophytic mass on the periphery of segment 6 in the liver. This is most suspicious for a cholangiocarcinoma. HCC would be less likely in the absence of underlying liver disease. No other liver lesions are noted, and there are no signs of cirrhosis or metastatic disease. I recommended proceeding with resection of this lesion both to obtain a  diagnosis and treat, given the concern for malignancy. This is amenable to a laparoscopic wedge resection. I reviewed the details of this procedure with the patient, including the benefits and risks of bleeding, infection and damage to surrounding structures. I also reviewed the possibility of conversion to a laparotomy, as well as the anticipated recovery course. She expressed understanding and agrees to proceed with surgery. She will be contacted to schedule an elective surgery date.   She has a small right adrenal adenoma noted on MRI. Can plan for follow up imaging in 1 year. We will discuss this further at her postop visit.  Sophronia Simas, MD [facility] Surgery General, Hepatobiliary and Pancreatic Surgery 01/19/23 7:45 PM

## 2023-01-19 NOTE — H&P (View-Only) (Signed)
History of Present Illness: Renee Brady is a 47 y.o. female who is seen today as an office consultation for evaluation of New Consultation (LIVER LESION)   She has been having generalized abdominal pain and has been following with GI. She underwent an abdominal CT for further evaluation, which showed a 2.8cm lesion between the right kidney and the right posterior liver. Interval follow up MRI was completed on 10/3. This showed a 2cm mass originating from the right liver, concerning for a malignancy. She has been referred to discuss surgery. She denies any known history of liver disease, has never had hepatitis, and has never consumed EtOH heavily. She has never used oral contraceptives.   Recent LFTs were normal, and tumor markers were as follows: CA19-9: 6 CEA: <2 AFP: 2.4   Previous abdominal surgeries include two C sections and a laparoscopic-assisted hysterectomy.       Review of Systems: A complete review of systems was obtained from the patient.  I have reviewed this information and discussed as appropriate with the patient.  See HPI as well for other ROS.     Medical History: Past Medical History Past Medical History: Diagnosis Date  Anxiety and depression    Asthma, unspecified asthma severity, unspecified whether complicated, unspecified whether persistent (HHS-HCC)    Hypertension        Problem List Patient Active Problem List Diagnosis  ADHD (attention deficit hyperactivity disorder)  Allergic rhinitis  ANA positive  Ankle pain, left  Asthma (HHS-HCC)  BMI 35.0-35.9,adult  Chronic pain due to trauma  Chronic pain of left knee  Anxiety and depression      Past Surgical History Past Surgical History: Procedure Laterality Date  BACK SURGERY      CESAREAN SECTION      WISDOM TEETH          Allergies Allergies Allergen Reactions  Sertraline Anxiety and Palpitations     Similar rxns to all SSRI's in the past.  Dimethyl Fumarate Other (See  Comments)  Fluoxetine Other (See Comments)  Gabapentin Other (See Comments)     Anger, hostility  Sulfamethoxazole Other (See Comments)     Pt unsure of reaction  Tramadol Itching  Latex, Natural Rubber Rash  Meloxicam Rash  Oxycodone-Acetaminophen Rash  Penicillins Rash      Medications Ordered Prior to Encounter Current Outpatient Medications on File Prior to Visit Medication Sig Dispense Refill  ALPRAZolam (XANAX) 0.5 MG tablet Take 0.5 mg by mouth at bedtime as needed for Sleep      amitriptyline (ELAVIL) 150 MG tablet Take 150 mg by mouth nightly        atorvastatin (LIPITOR) 20 MG tablet Take 20 mg by mouth once daily      azelastine (ASTELIN) 137 mcg nasal spray Place 1-2 sprays into one nostril 2 (two) times daily as needed      cholecalciferol (VITAMIN D3) 1000 unit capsule Take 1,000 Units by mouth once daily      cromolyn (CROLOM) 4 % ophthalmic solution Apply 1 drop to eye 4 (four) times daily      methocarbamol (ROBAXIN) 500 MG tablet Take 500 mg by mouth.      metoprolol succinate (TOPROL-XL) 25 MG XL tablet Take 1 tablet by mouth once daily      montelukast (SINGULAIR) 10 mg tablet Take 10 mg by mouth.      niFEdipine (PROCARDIA-XL) 60 MG (OSM) XL tablet Take 60 mg by mouth.      pantoprazole (PROTONIX) 40 MG DR  tablet Take 40 mg by mouth once daily      albuterol 90 mcg/actuation inhaler Inhale into the lungs. (Patient not taking: Reported on 01/19/2023)      isosorbide mononitrate (IMDUR) 30 MG ER tablet Take 1 tablet (30 mg total) by mouth once daily 30 tablet 11    No current facility-administered medications on file prior to visit.      Family History Family History Problem Relation Age of Onset  High blood pressure (Hypertension) Mother    Diabetes Mother    Stroke Mother    High blood pressure (Hypertension) Father    ADD / ADHD Father    Alzheimer's disease Paternal Grandfather        Tobacco Use History Social History    Tobacco Use Smoking  Status Former  Types: Cigarettes Smokeless Tobacco Never      Social History Social History    Socioeconomic History  Marital status: Married Tobacco Use  Smoking status: Former     Types: Cigarettes  Smokeless tobacco: Never Vaping Use  Vaping status: Never Used Substance and Sexual Activity  Alcohol use: Not Currently  Drug use: Yes     Comment: THC      Objective:   Vitals:   01/19/23 0958 BP: 126/87 Pulse: 81 Temp: 36.8 C (98.2 F) SpO2: 98% Weight: (!) 105.1 kg (231 lb 9.6 oz) Height: 165.1 cm (5\' 5" ) PainSc:   7   Body mass index is 38.54 kg/m.   Physical Exam Vitals reviewed.  Constitutional:      General: She is not in acute distress.    Appearance: Normal appearance.  HENT:     Head: Normocephalic and atraumatic.  Eyes:     General: No scleral icterus.    Conjunctiva/sclera: Conjunctivae normal.  Cardiovascular:     Rate and Rhythm: Normal rate and regular rhythm.     Heart sounds: No murmur heard. Pulmonary:     Effort: Pulmonary effort is normal. No respiratory distress.     Breath sounds: Normal breath sounds. No wheezing.  Abdominal:     General: There is no distension.     Palpations: Abdomen is soft.     Tenderness: There is no abdominal tenderness.     Comments: Well-healed Pfannenstiel incision and port site scars.  Skin:    General: Skin is warm and dry.     Coloration: Skin is not jaundiced.  Neurological:     General: No focal deficit present.     Mental Status: She is alert and oriented to person, place, and time.            Labs, Imaging and Diagnostic Testing: MRI abdomen 01/08/23: IMPRESSION: 1. Exophytic 2 cm segment VI hepatic lesion is concerning for hepatic malignancy with primary differential consideration of intrahepatic cholangiocarcinoma. 2. 7 mm right adrenal adenoma.     Assessment and Plan:   Assessment Diagnoses and all orders for this visit:   Liver mass, right lobe     This is a 47 yo female  with an incidental mass in the right posterior liver. I personally reviewed her labs, imaging and referral notes. She has an approximately 2cm exophytic mass on the periphery of segment 6 in the liver. This is most suspicious for a cholangiocarcinoma. HCC would be less likely in the absence of underlying liver disease. No other liver lesions are noted, and there are no signs of cirrhosis or metastatic disease. I recommended proceeding with resection of this lesion both to obtain a  diagnosis and treat, given the concern for malignancy. This is amenable to a laparoscopic wedge resection. I reviewed the details of this procedure with the patient, including the benefits and risks of bleeding, infection and damage to surrounding structures. I also reviewed the possibility of conversion to a laparotomy, as well as the anticipated recovery course. She expressed understanding and agrees to proceed with surgery. She will be contacted to schedule an elective surgery date.   She has a small right adrenal adenoma noted on MRI. Can plan for follow up imaging in 1 year. We will discuss this further at her postop visit.  Sophronia Simas, MD [facility] Surgery General, Hepatobiliary and Pancreatic Surgery 01/19/23 7:45 PM

## 2023-01-20 ENCOUNTER — Other Ambulatory Visit: Payer: Self-pay

## 2023-01-20 ENCOUNTER — Encounter: Payer: Self-pay | Admitting: Gastroenterology

## 2023-01-20 MED ORDER — FLUTICASONE FUROATE-VILANTEROL 100-25 MCG/ACT IN AEPB: 1.0000 | INHALATION_SPRAY | Freq: Every day | RESPIRATORY_TRACT | 5 refills | Status: DC

## 2023-01-20 NOTE — Telephone Encounter (Signed)
Advair diskus and breo are preferred according to formulary

## 2023-01-20 NOTE — Telephone Encounter (Signed)
Lets do Breo 200mg  1 puff daily. Thanks!

## 2023-01-20 NOTE — Telephone Encounter (Signed)
Is there a cheaper option for copay for the inhaler?  I had her on dulera?  Thanks!

## 2023-01-20 NOTE — Addendum Note (Signed)
Addended by: Berna Bue on: 01/20/2023 09:21 AM   Modules accepted: Orders

## 2023-01-21 ENCOUNTER — Encounter: Payer: Self-pay | Admitting: Family

## 2023-01-23 ENCOUNTER — Encounter: Payer: Self-pay | Admitting: Family

## 2023-01-23 ENCOUNTER — Encounter: Payer: Self-pay | Admitting: Cardiology

## 2023-01-26 ENCOUNTER — Ambulatory Visit (INDEPENDENT_AMBULATORY_CARE_PROVIDER_SITE_OTHER): Payer: Medicare Other | Admitting: Family

## 2023-01-26 ENCOUNTER — Encounter: Payer: Self-pay | Admitting: Family

## 2023-01-26 VITALS — BP 109/79 | HR 84 | Temp 98.1°F | Resp 16 | Ht 65.0 in | Wt 226.0 lb

## 2023-01-26 DIAGNOSIS — I10 Essential (primary) hypertension: Secondary | ICD-10-CM

## 2023-01-26 DIAGNOSIS — R7303 Prediabetes: Secondary | ICD-10-CM

## 2023-01-26 DIAGNOSIS — R222 Localized swelling, mass and lump, trunk: Secondary | ICD-10-CM | POA: Diagnosis not present

## 2023-01-26 DIAGNOSIS — F3181 Bipolar II disorder: Secondary | ICD-10-CM

## 2023-01-26 DIAGNOSIS — E559 Vitamin D deficiency, unspecified: Secondary | ICD-10-CM | POA: Diagnosis not present

## 2023-01-26 DIAGNOSIS — D509 Iron deficiency anemia, unspecified: Secondary | ICD-10-CM

## 2023-01-26 DIAGNOSIS — F4310 Post-traumatic stress disorder, unspecified: Secondary | ICD-10-CM

## 2023-01-26 DIAGNOSIS — E785 Hyperlipidemia, unspecified: Secondary | ICD-10-CM

## 2023-01-26 DIAGNOSIS — J4540 Moderate persistent asthma, uncomplicated: Secondary | ICD-10-CM

## 2023-01-26 DIAGNOSIS — M2520 Flail joint, unspecified joint: Secondary | ICD-10-CM

## 2023-01-26 DIAGNOSIS — G35 Multiple sclerosis: Secondary | ICD-10-CM

## 2023-01-26 DIAGNOSIS — I25118 Atherosclerotic heart disease of native coronary artery with other forms of angina pectoris: Secondary | ICD-10-CM

## 2023-01-26 LAB — LIPID PANEL
Cholesterol: 143 mg/dL (ref 0–200)
HDL: 54.3 mg/dL (ref >39.00)
LDL Cholesterol: 78 mg/dL (ref 0–99)
NonHDL: 88.56
Total CHOL/HDL Ratio: 3
Triglycerides: 54 mg/dL (ref 0.0–149.0)
VLDL: 10.8 mg/dL (ref 0.0–40.0)

## 2023-01-26 LAB — HEMOGLOBIN A1C: Hgb A1c MFr Bld: 6.4 % (ref 4.6–6.5)

## 2023-01-26 NOTE — Assessment & Plan Note (Signed)
Stopped iron supplement due to constipation.

## 2023-01-26 NOTE — Assessment & Plan Note (Signed)
BP Readings from Last 3 Encounters:  01/26/23 109/79  12/29/22 116/78  10/31/22 108/62   BP stable on toprol xl 25mg  once daily.

## 2023-01-26 NOTE — Assessment & Plan Note (Signed)
She sees [employer].

## 2023-01-26 NOTE — Assessment & Plan Note (Signed)
Continues vitamin D supplement.  

## 2023-01-26 NOTE — Assessment & Plan Note (Signed)
Reports her hands and feet are often cold.  Tries to keep extremities warm.

## 2023-01-26 NOTE — Patient Instructions (Signed)
VISIT SUMMARY:  During your visit, we discussed your upcoming surgery, your well-controlled asthma, your history of anemia, the resolved chest wall nodule, your Raynaud's disease, and your multiple sclerosis. We also reviewed your allergies to SSRIs and Advair, your well-managed hypertension, your history of a precancerous colon polyp, and your recent mammogram and COVID-19 vaccination.  YOUR PLAN:  -UPCOMING SURGERY: You have a surgery scheduled soon. If any paperwork requires my signature, we will schedule a special preoperative visit.  -MEDICATION ALLERGIES: You have reported allergies to all SSRIs and Advair. We will ensure these are listed as allergies in your chart.  -ASTHMA: Your asthma is well-controlled with infrequent use of your inhaler. Continue with your current management.  -IRON DEFICIENCY ANEMIA: You have a history of anemia but have stopped taking iron supplements due to constipation. We will check your iron levels today.  -CHEST WALL NODULE: The previously noted nodule on your chest wall has resolved and is no longer a concern. No further action is required at this time.  -RAYNAUD'S PHENOMENON: You have cold hands and feet due to Raynaud's disease, which you manage by keeping warm. Continue with your current management.  -MULTIPLE SCLEROSIS: You are not currently on any MS-specific medications due to fear of side effects. Continue with your current management and regular follow-ups with Dr. Epimenio Foot.  -HYPERLIPIDEMIA: Your last cholesterol check was approximately a year ago. We will check your cholesterol levels today.  -HYPERTENSION: Your high blood pressure is well-controlled on your current medications. Continue taking your medications as prescribed.  -COLON POLYP: You had a precancerous polyp removed with a recommendation for a repeat colonoscopy in 7 years. Plan for a repeat colonoscopy in 2031.  -MAMMOGRAM: Your last mammogram was done in November of the previous year.  Your next one is scheduled for November 3rd.  -COVID-19 VACCINATION: You have received your COVID-19 booster. No further action is required at this time.  INSTRUCTIONS:  Today, we will check your A1c, iron levels, and cholesterol. Please continue with your current management for your asthma, Raynaud's disease, and multiple sclerosis. Continue taking your medications as prescribed for your hypertension. Your next mammogram is scheduled for November 3rd. We will schedule a follow-up visit in 6 months.

## 2023-01-26 NOTE — Telephone Encounter (Signed)
Patient was seen today.

## 2023-01-26 NOTE — Assessment & Plan Note (Signed)
Lab Results  Component Value Date   HGBA1C 6.0 09/29/2022   HGBA1C 6.2 05/26/2022   HGBA1C 5.8 (H) 01/20/2022   Lab Results  Component Value Date   LDLCALC 67 01/20/2022   CREATININE 0.95 01/13/2023

## 2023-01-26 NOTE — Assessment & Plan Note (Signed)
Continues to follow with Dr. Epimenio Foot.

## 2023-01-26 NOTE — Assessment & Plan Note (Signed)
Continues to follow with Dr. Tomie China- continues statin, aspirin and beta blocker.  She keeps nitroglycerine on hand but has never needed.
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
## 2023-01-26 NOTE — Assessment & Plan Note (Signed)
She did not show any genetic markers for Ehlers Danlos syndrome.

## 2023-01-26 NOTE — Assessment & Plan Note (Signed)
Clinically stable- management per behavioral health.

## 2023-01-26 NOTE — Assessment & Plan Note (Addendum)
Had thrush on advair- has not started breo due to cost. Overall symptoms have been stable with use of prn albuterol.

## 2023-01-26 NOTE — Assessment & Plan Note (Signed)
Lab Results  Component Value Date   CHOL 138 01/20/2022   HDL 56 01/20/2022   LDLCALC 67 01/20/2022   TRIG 77 01/20/2022   CHOLHDL 2.5 01/20/2022   Has been stable on atorvastatin, update lipid panel.

## 2023-01-26 NOTE — Assessment & Plan Note (Signed)
Resolved.  No palpable mass on exam today.

## 2023-01-27 LAB — IRON,TIBC AND FERRITIN PANEL
%SAT: 16 % (ref 16–45)
Ferritin: 84 ng/mL (ref 16–232)
Iron: 51 ug/dL (ref 40–190)
TIBC: 323 ug/dL (ref 250–450)

## 2023-01-28 ENCOUNTER — Other Ambulatory Visit: Payer: Self-pay | Admitting: Family

## 2023-01-28 NOTE — Telephone Encounter (Signed)
Patient comment: This is taken for chronic pain number 1 prescribed by Dr. Derinda Sis pain specialist. If I have headaches Dr. Epimenio Foot recommends to take it for it. [facility] [place],

## 2023-01-28 NOTE — Pre-Procedure Instructions (Signed)
Surgical Instructions   Your procedure is scheduled on Thursday, October 31st. Report to [facility] Main Entrance "A" at 05:30 A.M., then check in with the Admitting office. Any questions or running late day of surgery: call [phone]  Questions prior to your surgery date: call [phone], Monday-Friday, 8am-4pm. If you experience any cold or flu symptoms such as cough, fever, chills, shortness of breath, etc. between now and your scheduled surgery, please notify us at the above number.     Remember:  Do not eat after midnight the night before your surgery   You may drink clear liquids until 04:30 AM the morning of your surgery.   Clear liquids allowed are: Water, Non-Citrus Juices (without pulp), Carbonated Beverages, Clear Tea, Black Coffee Only (NO MILK, CREAM OR POWDERED CREAMER of any kind), and Gatorade.  Patient Instructions  The night before surgery:  No food after midnight. ONLY clear liquids after midnight. Drink 2 Ensures the night before surgery.  The day of surgery (if you do NOT have diabetes):  Drink ONE (1) Pre-Surgery Clear Ensure by 04:30 AM the morning of surgery. Drink in one sitting. Do not sip.  This drink was given to you during your hospital  pre-op appointment visit.  Nothing else to drink after completing the  Pre-Surgery Clear Ensure.          If you have questions, please contact your surgeon's office.    Take these medicines the morning of surgery with A SIP OF WATER  atorvastatin (LIPITOR)  azelastine (ASTELIN) nasal spray cromolyn (OPTICROM) eye drops fluticasone furoate-vilanterol (BREO ELLIPTA)  isosorbide mononitrate (IMDUR)  metoprolol succinate (TOPROL-XL)  NIFEdipine (PROCARDIA-XL/NIFEDICAL-XL)  pantoprazole (PROTONIX)    May take these medicines IF NEEDED: albuterol (VENTOLIN HFA)- bring inhaler with you on day of surgery ALPRAZolam (XANAX)  hydrOXYzine (ATARAX)  ipratropium-albuterol (DUONEB)  methocarbamol (ROBAXIN)   nitroGLYCERIN (NITROSTAT)- if you have to take this medication prior to surgery, please call one of the above phone numbers and report this to a Nurse ondansetron (ZOFRAN)  topiramate (TOPAMAX)    Follow your surgeon's instructions on when to stop Aspirin.  If no instructions were given by your surgeon then you will need to call the office to get those instructions.    One week prior to surgery, STOP taking any Aleve, Naproxen, Ibuprofen, Motrin, Advil, Goody's, BC's, all herbal medications, fish oil, and non-prescription vitamins.                     Do NOT Smoke (Tobacco/Vaping) for 24 hours prior to your procedure.  If you use a CPAP at night, you may bring your mask/headgear for your overnight stay.   You will be asked to remove any contacts, glasses, piercing's, hearing aid's, dentures/partials prior to surgery. Please bring cases for these items if needed.    Patients discharged the day of surgery will not be allowed to drive home, and someone needs to stay with them for 24 hours.  SURGICAL WAITING ROOM VISITATION Patients may have no more than 2 support people in the waiting area - these visitors may rotate.   Pre-op nurse will coordinate an appropriate time for 1 ADULT support person, who may not rotate, to accompany patient in pre-op.  Children under the age of 60 must have an adult with them who is not the patient and must remain in the main waiting area with an adult.  If the patient needs to stay at the hospital during part of their recovery, the  visitor guidelines for inpatient rooms apply.  Please refer to the [facility] website for the visitor guidelines for any additional information.   If you received a COVID test during your pre-op visit  it is requested that you wear a mask when out in public, stay away from anyone that may not be feeling well and notify your surgeon if you develop symptoms. If you have been in contact with anyone that has tested positive in the last  10 days please notify you surgeon.      Pre-operative CHG Bathing Instructions   You can play a key role in reducing the risk of infection after surgery. Your skin needs to be as free of germs as possible. You can reduce the number of germs on your skin by washing with CHG (chlorhexidine gluconate) soap before surgery. CHG is an antiseptic soap that kills germs and continues to kill germs even after washing.   DO NOT use if you have an allergy to chlorhexidine/CHG or antibacterial soaps. If your skin becomes reddened or irritated, stop using the CHG and notify one of our RNs at [phone].              TAKE A SHOWER THE NIGHT BEFORE SURGERY AND THE DAY OF SURGERY    Please keep in mind the following:  DO NOT shave, including legs and underarms, 48 hours prior to surgery.   You may shave your face before/day of surgery.  Place clean sheets on your bed the night before surgery Use a clean washcloth (not used since being washed) for each shower. DO NOT sleep with pet's night before surgery.  CHG Shower Instructions:  Wash your face and private area with normal soap. If you choose to wash your hair, wash first with your normal shampoo.  After you use shampoo/soap, rinse your hair and body thoroughly to remove shampoo/soap residue.  Turn the water OFF and apply half the bottle of CHG soap to a CLEAN washcloth.  Apply CHG soap ONLY FROM YOUR NECK DOWN TO YOUR TOES (washing for 3-5 minutes)  DO NOT use CHG soap on face, private areas, open wounds, or sores.  Pay special attention to the area where your surgery is being performed.  If you are having back surgery, having someone wash your back for you may be helpful. Wait 2 minutes after CHG soap is applied, then you may rinse off the CHG soap.  Pat dry with a clean towel  Put on clean pajamas    Additional instructions for the day of surgery: DO NOT APPLY any lotions, deodorants, cologne, or perfumes.   Do not wear jewelry or makeup Do  not wear nail polish, gel polish, artificial nails, or any other type of covering on natural nails (fingers and toes) Do not bring valuables to the hospital. [facility] is not responsible for valuables/personal belongings. Put on clean/comfortable clothes.  Please brush your teeth.  Ask your nurse before applying any prescription medications to the skin.

## 2023-01-29 ENCOUNTER — Encounter: Payer: Self-pay | Admitting: *Deleted

## 2023-01-29 ENCOUNTER — Encounter: Payer: Self-pay | Admitting: Family

## 2023-01-29 ENCOUNTER — Encounter (HOSPITAL_COMMUNITY): Payer: Self-pay

## 2023-01-29 ENCOUNTER — Other Ambulatory Visit: Payer: Self-pay

## 2023-01-29 ENCOUNTER — Encounter (HOSPITAL_COMMUNITY)
Admission: RE | Admit: 2023-01-29 | Discharge: 2023-01-29 | Disposition: A | Discharge status: Home / Self Care | Payer: Medicare Other | Source: Ambulatory Visit | Attending: Surgery | Admitting: Surgery

## 2023-01-29 VITALS — BP 128/89 | HR 74 | Temp 98.4°F | Resp 18 | Ht 65.0 in | Wt 228.1 lb

## 2023-01-29 DIAGNOSIS — I2510 Atherosclerotic heart disease of native coronary artery without angina pectoris: Secondary | ICD-10-CM | POA: Insufficient documentation

## 2023-01-29 DIAGNOSIS — R160 Hepatomegaly, not elsewhere classified: Secondary | ICD-10-CM | POA: Diagnosis not present

## 2023-01-29 DIAGNOSIS — Z01818 Encounter for other preprocedural examination: Secondary | ICD-10-CM | POA: Insufficient documentation

## 2023-01-29 DIAGNOSIS — Z01810 Encounter for preprocedural cardiovascular examination: Secondary | ICD-10-CM | POA: Diagnosis present

## 2023-01-29 DIAGNOSIS — Z01812 Encounter for preprocedural laboratory examination: Secondary | ICD-10-CM | POA: Diagnosis present

## 2023-01-29 LAB — PREPARE RBC (CROSSMATCH)

## 2023-01-29 NOTE — Progress Notes (Signed)
PCP - [name], NP Cardiologist - Dr. Chales Abrahams.  -Pt has an appointment on Friday, 01/30/23 with Dr. Chales Abrahams for cardiac clearance for surgery.  PPM/ICD - n/a  Chest x-ray - n/a EKG - 01/29/23 Stress Test - 5+ years ago ECHO - 2019 Cardiac Cath - 06/08/20  Sleep Study - denies CPAP - denies  Blood Thinner Instructions: n/a Aspirin Instructions: Pt will call Dr. Eliot Ford office for further instructions  ERAS Protcol -(2) Ensures the night before surgery and (1) the morning of surgery. PRE-SURGERY Ensure or G2- (3) provided.   COVID TEST- n/a  Anesthesia review: Yes, Hx of CAD. Pending cardiac clearance.   Patient denies shortness of breath, fever, cough and chest pain at PAT appointment   All instructions explained to the patient, with a verbal understanding of the material. Patient agrees to go over the instructions while at home for a better understanding. Patient also instructed to self quarantine after being tested for COVID-19. The opportunity to ask questions was provided.

## 2023-01-29 NOTE — Progress Notes (Signed)
Cardiology Office Note:  .   Date:  01/30/2023  ID:  Renee Brady, DOB 1975-08-01, MRN [id number] PCP: [name], NP  [facility] Providers Cardiologist:  None    History of Present Illness: .   Renee Brady is a 47 y.o. female with a past medical history of CAD, hypertension, GERD, multiple sclerosis, fibromyalgia, anxiety, depression, ANA positive.  06/08/2020 left heart cath first diagonal 80% stenosis, pLAD 30% stenosed-- too small to intervene and medical management 05/25/2020 monitor average heart rate 78 bpm, predominant rhythm was sinus, first-degree AV block was present, 1 run of SVT, slow heart rates occurred in the early hours of the morning  Most recently evaluated by Dr. Tomie China on 04/17/2022, was doing well from a cardiac perspective and advise she can follow-up in 9 months.  She presents today for follow-up of her CAD, offers no complaints from a cardiac perspective.  She does have an upcoming surgery and here for preoperative evaluation as well.  ROS: ROS  Studies Reviewed: .        Cardiac Studies & Procedures   CARDIAC CATHETERIZATION  CARDIAC CATHETERIZATION 06/08/2020  Narrative  1st Diag lesion is 80% stenosed.  Prox LAD lesion is 30% stenosed.  The left ventricular systolic function is normal.  LV end diastolic pressure is normal.  The left ventricular ejection fraction is 55-65% by visual estimate.  There is no mitral valve regurgitation.  1. No left main disease 2. Mild mid LAD stenosis at the takeoff of the small first diagonal branch. The diagonal branch is small in caliber and has diffuse severe stenosis from the ostium through the mid segment. 3. No disease noted in the large caliber Circumflex artery which supplies a large obtuse marginal branch 4. The RCA is a large dominant vessel with no obstructive disease.  Recommendations: Her major epicardial vessels have no obstructive disease. The small caliber diagonal branch that  supplies a small segment of myocardium has severe, diffuse ostial/proximal and mid disease. This vessel is too small for PCI. Medical management of CAD.  I think she would benefit from anti-anginal therapy. We will add Imdur 30 mg daily. If she does not tolerate this, I would consider adding Ranexa.  Findings Coronary Findings Diagnostic  Dominance: Right  Left Anterior Descending Vessel is large. Prox LAD lesion is 30% stenosed.  First Diagonal Branch Vessel is small in size. 1st Diag lesion is 80% stenosed.  Left Circumflex Vessel is large.  Right Coronary Artery Vessel is large.  Intervention  No interventions have been documented.       MONITORS  LONG TERM MONITOR (3-14 DAYS) 06/14/2020  Narrative Patch Wear Time:  14 days and 0 hours (2022-02-18T11:07:17-0500 to 2022-03-04T11:07:21-0500)  Patient had a min HR of 27 bpm, max HR of 143 bpm, and avg HR of 78 bpm. Predominant underlying rhythm was Sinus Rhythm. First Degree AV Block was present. 1 run of Supraventricular Tachycardia occurred lasting 4 beats with a max rate of 118 bpm (avg 113 bpm). Second Degree AV Block-Mobitz I (Wenckebach) was present. Isolated SVEs were rare (<1.0%), SVE Couplets were rare (<1.0%), and no SVE Triplets were present. Isolated VEs were rare (<1.0%), and no VE Couplets or VE Triplets were present.  Impression: Unremarkable event monitor.  Event monitor is abnormal.  Slow heart rates in the early hours of the morning.  Patient does have mild degree of AV conduction problem.  Symptoms did not correlate with any findings on the monitor.  Risk Assessment/Calculations:             Physical Exam:   VS:  BP 128/84 (BP Location: Right Arm, Patient Position: Sitting, Cuff Size: Normal)   Pulse 62   Ht 5\' 5"  (1.651 m)   Wt 226 lb (102.5 kg)   SpO2 97%   BMI 37.61 kg/m    Wt Readings from Last 3 Encounters:  01/30/23 226 lb (102.5 kg)  01/29/23 228 lb 1.6 oz (103.5 kg)  01/26/23 226  lb (102.5 kg)    GEN: Well nourished, well developed in no acute distress NECK: No JVD; No carotid bruits CARDIAC: RRR, no murmurs, rubs, gallops RESPIRATORY:  Clear to auscultation without rales, wheezing or rhonchi  ABDOMEN: Soft, non-tender, non-distended EXTREMITIES:  No edema; No deformity   ASSESSMENT AND PLAN: .   CAD -  Left heart cath in 2022 revealed first diagonal 80% stenosis, pLAD 30% stenosed-- too small to intervene and medical management. Stable with no anginal symptoms. No indication for ischemic evaluation.  Continue aspirin 81 mg daily, continue Lipitor 20 mg daily, continue metoprolol 25 mg daily, continue isosorbide 30 mg daily, continue nitroglycerin as needed.  HTN -her blood pressure is well-controlled today at 128/84 continue metoprolol 25 mg daily.  HLD -most recent LDL was 78, prefer this to be less than 70 however she has gained approximately 7 pounds and is working on trying to get this off.  For now we will continue her on Lipitor 20 mg daily.  Murmur-not appreciated today upon exam however it has been noticed in the past.  We discussed indication for evaluating this further with echocardiogram however she has a lot going on right now with her upcoming surgery so we will defer this to her next OV.  Preoperative cardiovascular evaluation - According to the Revised Cardiac Risk Index (RCRI), her Perioperative Risk of Major Cardiac Event is (%): 0.4 Her Functional Capacity in METs is: 6.05 according to the Duke Activity Status Index (DASI). Therefore, based on ACC/AHA guidelines, patient would be at acceptable risk for the planned procedure without further cardiovascular testing. I will route this recommendation to the requesting party via Epic fax function.  Regarding her aspirin therapy, we prefer this continued throughout the perioperative process however if needed can be held for 7 days.       Dispo: Follow-up in 6 months, plan echocardiogram at that time to  evaluate murmur that has been noticed by other clinicians.  Signed, Flossie Dibble, NP

## 2023-01-30 ENCOUNTER — Ambulatory Visit: Payer: Medicare Other | Attending: Cardiology | Admitting: Cardiology

## 2023-01-30 ENCOUNTER — Encounter: Payer: Self-pay | Admitting: Physical Medicine and Rehabilitation

## 2023-01-30 ENCOUNTER — Encounter: Payer: Self-pay | Admitting: Cardiology

## 2023-01-30 ENCOUNTER — Telehealth: Payer: Self-pay

## 2023-01-30 VITALS — BP 128/84 | HR 62 | Ht 65.0 in | Wt 226.0 lb

## 2023-01-30 DIAGNOSIS — R011 Cardiac murmur, unspecified: Secondary | ICD-10-CM

## 2023-01-30 DIAGNOSIS — I10 Essential (primary) hypertension: Secondary | ICD-10-CM | POA: Diagnosis present

## 2023-01-30 DIAGNOSIS — Z01810 Encounter for preprocedural cardiovascular examination: Secondary | ICD-10-CM | POA: Diagnosis present

## 2023-01-30 DIAGNOSIS — I25118 Atherosclerotic heart disease of native coronary artery with other forms of angina pectoris: Secondary | ICD-10-CM | POA: Diagnosis present

## 2023-01-30 DIAGNOSIS — E782 Mixed hyperlipidemia: Secondary | ICD-10-CM

## 2023-01-30 MED ORDER — TOPIRAMATE 25 MG PO TABS: 25.0000 mg | ORAL_TABLET | Freq: Every day | ORAL | 3 refills | Status: DC | PRN

## 2023-01-30 NOTE — Progress Notes (Signed)
Anesthesia Chart Review:  Case: [id number] Date/Time: 02/05/23 0715   Procedure: LAPAROSCOPIC PARTIAL RIGHT HEPATECTOMY, POSSIBLE OPEN WITH INTRAOPERATIVE ULTRASOUND (Right)   Anesthesia type: General   Pre-op diagnosis: LIVER MASS   [facility]: [facility] ROOM [facility] / [facility]   Surgeons: Fritzi Mandes, MD       DISCUSSION: Patient is a 47 year old scheduled for the above procedure. 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 She was referred to Dr. Freida Busman and the above procedure recommended.   History includes former smoker (quit 04/08/03), CAD 80% small D1, 30% pLAD, medical therapy 06/08/20), HTN, HLD, asthma, GERD, IBS, multiple sclerosis, iron deficiency anemia, pre-diabetes (A1c 6.4% 01/26/23, up from 6.0%), Bipolar 2 disorder, ADHD, PTSD, +ANA with Raynaud's disease, fibromyalgia, hysterectomy, spinal surgery (laminectomy 11/02/16). BMI is consistent with obesity.   Preoperative cardiology evaluation by Wallis Bamberg, NP on 01/30/2023. Noted prior murmur history, but not heard on exam. She had only trivial PR on 2019 echo. She may consider updating echo in the future. BP well controlled. No anginal symptoms, so no indication for ischemic evaluation at this time. In regards to preoperative CV risk assessment, "According to the Revised Cardiac Risk Index (RCRI), her Perioperative Risk of Major Cardiac Event is (%): 0.4 Her Functional Capacity in METs is: 6.05 according to the Duke Activity Status Index (DASI). Therefore, based on ACC/AHA guidelines, patient would be at acceptable risk for the planned procedure without further cardiovascular testing. I will route this recommendation to the requesting party via Epic fax function.  Regarding her aspirin therapy, we prefer this continued throughout the perioperative process however if needed can be held for 7 days." Patient to contact surgery to clarify instructions.   Anesthesia team to evaluate on the day of surgery.     VS: BP 128/89   Pulse 74   Temp 36.9 C (Oral)   Resp 18   Ht 5\' 5"  (1.651 m)   Wt 103.5 kg   SpO2 100%   BMI 37.96 kg/m    PROVIDERS: [name], NP is PCP  - Belva Crome, MD is cardiologist - Lynann Bologna, MD is GI. EGD and colonoscopy 10/31/22.  Epimenio Foot, Richard, MD is neurologist. Last visit 08/13/22 for MS, not currently on disease modifying therapy, but with activity on MRI so discussed trying teriflunomide.  Follow-up in 6 months with LFTs. Ferol Luz, MD is immunologist/allergist. Last visit 12/29/22. Spirometry okay, but based on symptoms recommended to start Symbicort to better control asthma.  Recommended adding nasal steroi for allergic rhinitis and continue Singulair and antihistamine. Symbicort changed to [facility] then Breo (due to cost) on 01/06/23.   Sula Soda, MD is PM&R - Francee Gentile, MD is rheumatologist. Evaluated 08/28/22 for + ANA, Raynaud's symptoms, degenerative arthritis and hypermobility. As needed follow-up per note.    LABS: Most recent labs results in [facility] include: Lab Results  Component Value Date   WBC 5.0 01/13/2023   HGB 12.1 01/13/2023   HCT 39.4 01/13/2023   PLT 299.0 01/13/2023   GLUCOSE 130 (H) 01/13/2023   CHOL 143 01/26/2023   TRIG 54.0 01/26/2023   HDL 54.30 01/26/2023   LDLCALC 78 01/26/2023   ALT 17 01/13/2023   AST 12 01/13/2023   NA 138 01/13/2023   K 4.0 01/13/2023   CL 104 01/13/2023   CREATININE 0.95 01/13/2023   BUN 11 01/13/2023   CO2 26 01/13/2023   TSH 1.240 06/06/2021   HGBA1C 6.4 01/26/2023  Labs Reviewed  TYPE AND SCREEN  PREPARE RBC (CROSSMATCH)    Spirometry 12/29/22: FVC 2.95 (95%).  FEV1 2.54 (101%).  FEV1 are 0.68 (106%).  FEF 25-75 3.43 (133%).   IMAGES: MRI Abd 01/08/23: IMPRESSION: 1. Exophytic 2 cm segment VI hepatic lesion is concerning for hepatic malignancy with primary differential consideration of intrahepatic cholangiocarcinoma. 2. 7 mm right adrenal  adenoma.   CT Abd/pelvis 10/06/22: IMPRESSION: 1. No acute inflammatory process identified within the abdomen or pelvis. 2. There is a 2.8 cm lobulated hypoattenuating structure in the Morrison's pouch, with imaging characteristics and follow-up recommendations as described in detail above. 3. Multiple other nonacute observations, as described above.  MRI Brain 04/02/22: IMPRESSION: Multiple T2/FLAIR hyperintense foci in the cerebral hemispheres, pons and cerebellum in a pattern consistent with chronic demyelinating plaque associated with multiple sclerosis.  None of the foci enhanced.  Compared to the MRI from 2019, there are foci in the posterior right pons and medulla/cervicomedullary junction not clearly present in the past. No acute findings.  Normal enhancement.    EKG: 01/29/23: NSR   CV: Cardiac cath 06/08/20: 1st Diag lesion is 80% stenosed. Prox LAD lesion is 30% stenosed. The left ventricular systolic function is normal. LV end diastolic pressure is normal. The left ventricular ejection fraction is 55-65% by visual estimate. There is no mitral valve regurgitation.   1. No left main disease 2. Mild mid LAD stenosis at the takeoff of the small first diagonal branch. The diagonal branch is small in caliber and has diffuse severe stenosis from the ostium through the mid segment.  3. No disease noted in the large caliber Circumflex artery which supplies a large obtuse marginal branch 4. The RCA is a large dominant vessel with no obstructive disease.    Recommendations: Her major epicardial vessels have no obstructive disease. The small caliber diagonal branch that supplies a small segment of myocardium has severe, diffuse ostial/proximal and mid disease. This vessel is too small for PCI. Medical management of CAD.  I think she would benefit from anti-anginal therapy. We will add Imdur 30 mg daily. If she does not tolerate this, I would consider adding Ranexa.    Long term  monitor 05/25/20 - 06/08/20: Patient had a min HR of 27 bpm, max HR of 143 bpm, and avg HR of 78 bpm. Predominant underlying rhythm was Sinus Rhythm. First Degree AV Block was present. 1 run of Supraventricular Tachycardia occurred lasting 4 beats with a max rate of 118 bpm (avg 113  bpm). Second Degree AV Block-Mobitz I (Wenckebach) was present. Isolated SVEs were rare (<1.0%), SVE Couplets were rare (<1.0%), and no SVE Triplets were present. Isolated VEs were rare (<1.0%), and no VE Couplets or VE Triplets were present.   Impression: Unremarkable event monitor.  Event monitor is abnormal.  Slow heart rates in the early hours of the morning.  Patient does have mild degree of AV conduction problem.  Symptoms did not correlate with any findings on the monitor.   Echo 09/11/17 ([facility]): INTERPRETATION  NORMAL LEFT VENTRICULAR SYSTOLIC FUNCTION WITH AN ESTIMATED EF = >55 %  NORMAL RIGHT VENTRICULAR SYSTOLIC FUNCTION  TRIVIAL REGURGITATION NOTED (Trivial PR)  NO VALVULAR STENOSIS  DIASTOLIC DYSFUNCTION (GRADE 1)    Past Medical History:  Diagnosis Date   ADHD    Allergic conjunctivitis    ANA positive 06/26/2014   Formatting of this note might be different from the original. Overview:  Evaluated by Rheumatology Dr. Herma Carson - dx fibromyalgia given.  Angina pectoris (HCC) 05/25/2020   Ankle pain, left 08/27/2014   Formatting of this note might be different from the original. Overview:  Fell on this day with injury to Lt foot and ankle.   Anxiety and depression 04/03/2015   Asthma    Bipolar 2 disorder, major depressive episode (HCC)    suspected due to being on SSRIs and causing irritability   CAD (coronary artery disease) 07/03/2020   Cervical radiculitis 10/23/2016   Chronic allergic conjunctivitis    Chronic pain of left knee 09/18/2014   Formatting of this note might be different from the original. Overview:  Evaluated by Ortho.   Chronic pain syndrome 02/16/2014   trauma   Diverticulosis     Per patient.   Dysesthesia 01/26/2019   Epidural fibrosis 06/15/2019   Essential hypertension    Fibromyalgia muscle pain 05/2014   Gait disturbance 02/11/2022   Gastroduodenitis    Per patient.   GERD (gastroesophageal reflux disease) 02/2014   Herniated nucleus pulposus, cervical 10/23/2016   High risk medication use 01/29/2018   Hyperlipidemia    Hypertension    IBS (irritable bowel syndrome)    Per patient.   Iron deficiency anemia    Levoscoliosis    Lumbar back pain    Lumbar radiculopathy 01/26/2019   Mixed dyslipidemia 05/25/2020   Multiple sclerosis (HCC) 12/22/2014   Overview:  JC virus antibody positive with index 2.67- checked 12/2014   Muscle spasm    Nausea 08/23/2018   Numbness and tingling 04/11/2014   Obesity (BMI 35.0-39.9 without comorbidity) 05/25/2020   Other headache syndrome 01/29/2018   Pes anserinus bursitis of right knee 07/24/2014   Formatting of this note might be different from the original. PT, Life-style modification, and NSAID PRN.   Prediabetes 10/18/2021   PTSD (post-traumatic stress disorder)    Raynaud's disease 06/05/2014   RBC microcytosis    Spinal stenosis in cervical region 10/23/2016   Trochanteric bursitis of right hip 07/13/2014   Formatting of this note might be different from the original. Evaluated by Ortho.  Tx with CSI and PT.   Urinary urgency 01/29/2018   Vision abnormalities    Vitamin D deficiency 07/28/2014   Formatting of this note might be different from the original. Daily supplement.    Past Surgical History:  Procedure Laterality Date   ABDOMINAL HYSTERECTOMY     uterus and cervix removed but still has ovaries   CESAREAN SECTION  x 2   COLONOSCOPY  12/28/2009   Mild sigmoid diverticulosis. Small internal hemorrhoids.   ESOPHAGOGASTRODUODENOSCOPY  12/28/2009   Mild gastroduodenitis. Otherwise normal EGD   hip dysplasia Bilateral    pt states she was told by ortho that she has this   LEFT HEART CATH AND  CORONARY ANGIOGRAPHY N/A 06/08/2020   Procedure: LEFT HEART CATH AND CORONARY ANGIOGRAPHY;  Surgeon: Kathleene Hazel, MD;  Location: [facility] INVASIVE CV LAB;  Service: Cardiovascular;  Laterality: N/A;   lumbar laminectomy Left 11/02/2016   TONSILLECTOMY     WISDOM TOOTH EXTRACTION      MEDICATIONS:  albuterol (VENTOLIN HFA) 108 (90 Base) MCG/ACT inhaler   ALPRAZolam (XANAX) 0.5 MG tablet   amitriptyline (ELAVIL) 50 MG tablet   aspirin EC 81 MG tablet   atorvastatin (LIPITOR) 20 MG tablet   azelastine (ASTELIN) 0.1 % nasal spray   Cholecalciferol (VITAMIN D3) 25 MCG (1000 UT) CAPS   cromolyn (OPTICROM) 4 % ophthalmic solution   fluticasone furoate-vilanterol (BREO ELLIPTA) 100-25 MCG/ACT AEPB  hydrOXYzine (ATARAX) 25 MG tablet   ipratropium-albuterol (DUONEB) 0.5-2.5 (3) MG/3ML SOLN   isosorbide mononitrate (IMDUR) 30 MG 24 hr tablet   methocarbamol (ROBAXIN) 500 MG tablet   metoprolol succinate (TOPROL-XL) 25 MG 24 hr tablet   montelukast (SINGULAIR) 10 MG tablet   NIFEdipine (PROCARDIA-XL/NIFEDICAL-XL) 30 MG 24 hr tablet   nitroGLYCERIN (NITROSTAT) 0.4 MG SL tablet   omega-3 acid ethyl esters (LOVAZA) 1 g capsule   ondansetron (ZOFRAN) 4 MG tablet   pantoprazole (PROTONIX) 40 MG tablet   topiramate (TOPAMAX) 25 MG tablet   triamcinolone cream (KENALOG) 0.1 %   zolpidem (AMBIEN) 5 MG tablet   No current facility-administered medications for this encounter.    Shonna Chock, PA-C Surgical Short Stay/Anesthesiology [facility] Phone [phone] [facility] Phone [phone] 01/30/2023 5:04 PM

## 2023-01-30 NOTE — Patient Instructions (Signed)
Medication Instructions:  Your physician recommends that you continue on your current medications as directed. Please refer to the Current Medication list given to you today.  *If you need a refill on your cardiac medications before your next appointment, please call your pharmacy*   Lab Work: NONE If you have labs (blood work) drawn today and your tests are completely normal, you will receive your results only by: [place] (if you have MyChart) OR A paper copy in the mail If you have any lab test that is abnormal or we need to change your treatment, we will call you to review the results.   Testing/Procedures: NONE   Follow-Up: At [facility], you and your health needs are our priority.  As part of our continuing mission to provide you with exceptional heart care, we have created designated Provider Care Teams.  These Care Teams include your primary Cardiologist (physician) and Advanced Practice Providers (APPs -  Physician Assistants and Nurse Practitioners) who all work together to provide you with the care you need, when you need it.  We recommend signing up for the patient portal called "MyChart".  Sign up information is provided on this After Visit Summary.  MyChart is used to connect with patients for Virtual Visits (Telemedicine).  Patients are able to view lab/test results, encounter notes, upcoming appointments, etc.  Non-urgent messages can be sent to your provider as well.   To learn more about what you can do with MyChart, go to [email or web address].    Your next appointment:   6 month(s)  Provider:   Jyl Heinz, MD    Other Instructions

## 2023-01-30 NOTE — Anesthesia Preprocedure Evaluation (Signed)
Anesthesia Evaluation  Patient identified by MRN, date of birth, ID band Patient awake    Reviewed: Allergy & Precautions, H&P , NPO status , Patient's Chart, lab work & pertinent test results  Airway Mallampati: I  TM Distance: >3 FB Neck ROM: Full    Dental no notable dental hx. (+) Teeth Intact, Dental Advisory Given   Pulmonary asthma , former smoker   Pulmonary exam normal breath sounds clear to auscultation       Cardiovascular Exercise Tolerance: Good hypertension, Pt. on medications and Pt. on home beta blockers + angina  + CAD  Normal cardiovascular exam Rhythm:Regular Rate:Normal     Neuro/Psych  Headaches PSYCHIATRIC DISORDERS Anxiety Depression Bipolar Disorder   MRI Brain 04/02/22: IMPRESSION: 1. Multiple T2/FLAIR hyperintense foci in the cerebral hemispheres, pons and cerebellum in a pattern consistent with chronic demyelinating plaque associated with multiple sclerosis.  None of the foci enhanced.  Compared to the MRI from 2019, there are foci in the posterior right pons and medulla/cervicomedullary junction not clearly present in the past. 2. No acute findings.  Normal enhancement.   Neuromuscular disease negative neurological ROS  negative psych ROS   GI/Hepatic negative GI ROS, Neg liver ROS,GERD  Medicated,,  Endo/Other  negative endocrine ROS    Renal/GU negative Renal ROS  negative genitourinary   Musculoskeletal negative musculoskeletal ROS (+)  Fibromyalgia -  Abdominal   Peds negative pediatric ROS (+)  Hematology negative hematology ROS (+) Blood dyscrasia, anemia   Anesthesia Other Findings   Reproductive/Obstetrics negative OB ROS                              Anesthesia Physical Anesthesia Plan  ASA: 3  Anesthesia Plan: General   Post-op Pain Management: Dilaudid IV and Precedex   Induction: Intravenous  PONV Risk Score and Plan: 3 and Ondansetron,  Dexamethasone, Treatment may vary due to age or medical condition and Midazolam  Airway Management Planned: Oral ETT  Additional Equipment: ClearSight  Intra-op Plan:   Post-operative Plan: Extubation in OR and Possible Post-op intubation/ventilation  Informed Consent: I have reviewed the patients History and Physical, chart, labs and discussed the procedure including the risks, benefits and alternatives for the proposed anesthesia with the patient or authorized representative who has indicated his/her understanding and acceptance.       Plan Discussed with: CRNA  Anesthesia Plan Comments: (2 Large bore IV and art line  PAT note written 01/30/2023 by Shonna Chock, PA-C.  DISCUSSION: Patient is a 47 year old scheduled for the above procedure. 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 She was referred to Dr. Freida Busman and the above procedure recommended.    History includes former smoker (quit 04/08/03), CAD 80% small D1, 30% pLAD, medical therapy 06/08/20), HTN, HLD, asthma, GERD, IBS, multiple sclerosis, iron deficiency anemia, pre-diabetes (A1c 6.4% 01/26/23, up from 6.0%), Bipolar 2 disorder, ADHD, PTSD, +ANA with Raynaud's disease, fibromyalgia, hysterectomy, spinal surgery (laminectomy 11/02/16). BMI is consistent with obesity.    Preoperative cardiology evaluation by Wallis Bamberg, NP on 01/30/2023. Noted prior murmur history, but not heard on exam. She had only trivial PR on 2019 echo. She may consider updating echo in the future. BP well controlled. No anginal symptoms, so no indication for ischemic evaluation at this time. In regards to preoperative CV risk assessment, "According to the Revised Cardiac Risk Index (RCRI), her Perioperative Risk of Major Cardiac Event is (%):  0.4 Her Functional Capacity in METs is: 6.05 according to the Duke Activity Status Index (DASI). Therefore, based on ACC/AHA guidelines, patient would be at acceptable risk  for the planned procedure without further cardiovascular testing. I will route this recommendation to the requesting party via Epic fax function.  Regarding her aspirin therapy, we prefer this continued throughout the perioperative process however if needed can be held for 7 days." Patient to contact surgery to clarify instructions.   EKG: 01/29/23: NSR     CV: Cardiac cath 06/08/20:  1st Diag lesion is 80% stenosed.  Prox LAD lesion is 30% stenosed.  The left ventricular systolic function is normal.  LV end diastolic pressure is normal.  The left ventricular ejection fraction is 55-65% by visual estimate.  There is no mitral valve regurgitation.   1. No left main disease 2. Mild mid LAD stenosis at the takeoff of the small first diagonal branch. The diagonal branch is small in caliber and has diffuse severe stenosis from the ostium through the mid segment.  3. No disease noted in the large caliber Circumflex artery which supplies a large obtuse marginal branch 4. The RCA is a large dominant vessel with no obstructive disease.    Recommendations: Her major epicardial vessels have no obstructive disease. The small caliber diagonal branch that supplies a small segment of myocardium has severe, diffuse ostial/proximal and mid disease. This vessel is too small for PCI. Medical management of CAD.  I think she would benefit from anti-anginal therapy. We will add Imdur 30 mg daily. If she does not tolerate this, I would consider adding Ranexa.      Long term monitor 05/25/20 - 06/08/20: Patient had a min HR of 27 bpm, max HR of 143 bpm, and avg HR of 78 bpm. Predominant underlying rhythm was Sinus Rhythm. First Degree AV Block was present. 1 run of Supraventricular Tachycardia occurred lasting 4 beats with a max rate of 118 bpm (avg 113  bpm). Second Degree AV Block-Mobitz I (Wenckebach) was present. Isolated SVEs were rare (<1.0%), SVE Couplets were rare (<1.0%), and no SVE Triplets were present.  Isolated VEs were rare (<1.0%), and no VE Couplets or VE Triplets were present.   Impression: Unremarkable event monitor.  Event monitor is abnormal.  Slow heart rates in the early hours of the morning.  Patient does have mild degree of AV conduction problem.  Symptoms did not correlate with any findings on the monitor.  )        Anesthesia Quick Evaluation

## 2023-01-30 NOTE — Telephone Encounter (Signed)
We received a refill request for Topiramate and I called patient to see if she requested refill. According to Dr. Carlis Abbott last note "-she has since stopper the Topiramate and would like to see if staying off the medication for more time will help with her symptoms".  Left message for patient to call back.

## 2023-02-04 ENCOUNTER — Other Ambulatory Visit: Payer: Self-pay | Admitting: *Deleted

## 2023-02-04 NOTE — Progress Notes (Signed)
The proposed treatment discussed in conference is for discussion purpose only and is not a binding recommendation.  The patients have not been physically examined, or presented with their treatment options.  Therefore, final treatment plans cannot be decided.  

## 2023-02-05 ENCOUNTER — Encounter (HOSPITAL_COMMUNITY): Payer: Self-pay | Admitting: Surgery

## 2023-02-05 ENCOUNTER — Ambulatory Visit (HOSPITAL_COMMUNITY)
Admission: RE | Admit: 2023-02-05 | Discharge: 2023-02-05 | Disposition: A | Discharge status: Home / Self Care | Payer: Medicare Other | Source: Ambulatory Visit | Attending: Surgery | Admitting: Surgery

## 2023-02-05 ENCOUNTER — Other Ambulatory Visit: Payer: Self-pay

## 2023-02-05 ENCOUNTER — Encounter (HOSPITAL_COMMUNITY)
Admission: RE | Disposition: A | Discharge status: Home / Self Care | Payer: Self-pay | Source: Ambulatory Visit | Attending: Surgery

## 2023-02-05 ENCOUNTER — Inpatient Hospital Stay (HOSPITAL_BASED_OUTPATIENT_CLINIC_OR_DEPARTMENT_OTHER): Payer: Medicare Other | Admitting: Certified Registered"

## 2023-02-05 ENCOUNTER — Inpatient Hospital Stay (HOSPITAL_COMMUNITY): Payer: Medicare Other | Admitting: Vascular Surgery

## 2023-02-05 DIAGNOSIS — I10 Essential (primary) hypertension: Secondary | ICD-10-CM | POA: Diagnosis not present

## 2023-02-05 DIAGNOSIS — Z8249 Family history of ischemic heart disease and other diseases of the circulatory system: Secondary | ICD-10-CM | POA: Diagnosis not present

## 2023-02-05 DIAGNOSIS — I2510 Atherosclerotic heart disease of native coronary artery without angina pectoris: Secondary | ICD-10-CM | POA: Diagnosis not present

## 2023-02-05 DIAGNOSIS — R1909 Other intra-abdominal and pelvic swelling, mass and lump: Secondary | ICD-10-CM | POA: Diagnosis not present

## 2023-02-05 DIAGNOSIS — Z87891 Personal history of nicotine dependence: Secondary | ICD-10-CM | POA: Insufficient documentation

## 2023-02-05 DIAGNOSIS — K6389 Other specified diseases of intestine: Secondary | ICD-10-CM

## 2023-02-05 DIAGNOSIS — R160 Hepatomegaly, not elsewhere classified: Secondary | ICD-10-CM | POA: Diagnosis present

## 2023-02-05 HISTORY — PX: LAPAROSCOPIC REMOVAL ABDOMINAL MASS: SHX6360

## 2023-02-05 LAB — ABO/RH: ABO/RH(D): A POS

## 2023-02-05 SURGERY — REMOVAL, MASS, ABDOMEN, LAPAROSCOPIC
Anesthesia: General | Laterality: Right

## 2023-02-05 MED ORDER — PROPOFOL 10 MG/ML IV BOLUS
INTRAVENOUS | Status: DC | PRN
  Administered 2023-02-05: 50 ug/kg/min via INTRAVENOUS
  Administered 2023-02-05: 50 mg via INTRAVENOUS
  Administered 2023-02-05: 150 mg via INTRAVENOUS

## 2023-02-05 MED ORDER — FENTANYL CITRATE (PF) 250 MCG/5ML IJ SOLN
INTRAMUSCULAR | Status: AC
  Filled 2023-02-05: qty 5

## 2023-02-05 MED ORDER — CHLORHEXIDINE GLUCONATE 0.12 % MT SOLN
15.0000 mL | Freq: Once | OROMUCOSAL | Status: AC
Start: 2023-02-05 — End: 2023-02-05
  Administered 2023-02-05: 15 mL via OROMUCOSAL
  Filled 2023-02-05: qty 15

## 2023-02-05 MED ORDER — DEXAMETHASONE SODIUM PHOSPHATE 10 MG/ML IJ SOLN
INTRAMUSCULAR | Status: DC | PRN
  Administered 2023-02-05: 10 mg via INTRAVENOUS

## 2023-02-05 MED ORDER — ENSURE PRE-SURGERY PO LIQD: 296.0000 mL | Freq: Once | ORAL | Status: DC

## 2023-02-05 MED ORDER — PROPOFOL 10 MG/ML IV BOLUS
INTRAVENOUS | Status: AC
  Filled 2023-02-05: qty 20

## 2023-02-05 MED ORDER — KETAMINE HCL 50 MG/5ML IJ SOSY
PREFILLED_SYRINGE | INTRAMUSCULAR | Status: AC
  Filled 2023-02-05: qty 10

## 2023-02-05 MED ORDER — ONDANSETRON HCL 4 MG/2ML IJ SOLN: 4.0000 mg | Freq: Once | INTRAMUSCULAR | Status: DC | PRN

## 2023-02-05 MED ORDER — 0.9 % SODIUM CHLORIDE (POUR BTL) OPTIME
TOPICAL | Status: DC | PRN
  Administered 2023-02-05: 1000 mL

## 2023-02-05 MED ORDER — OXYCODONE HCL 5 MG/5ML PO SOLN
5.0000 mg | Freq: Once | ORAL | Status: AC | PRN
  Administered 2023-02-05: 5 mg via ORAL

## 2023-02-05 MED ORDER — DEXAMETHASONE SODIUM PHOSPHATE 10 MG/ML IJ SOLN
INTRAMUSCULAR | Status: AC
  Filled 2023-02-05: qty 1

## 2023-02-05 MED ORDER — MEPERIDINE HCL 25 MG/ML IJ SOLN: 6.2500 mg | INTRAMUSCULAR | Status: DC | PRN

## 2023-02-05 MED ORDER — SUGAMMADEX SODIUM 200 MG/2ML IV SOLN
INTRAVENOUS | Status: DC | PRN
  Administered 2023-02-05: 200 mg via INTRAVENOUS

## 2023-02-05 MED ORDER — OXYCODONE HCL 5 MG/5ML PO SOLN
ORAL | Status: AC
  Filled 2023-02-05: qty 5

## 2023-02-05 MED ORDER — ONDANSETRON HCL 4 MG/2ML IJ SOLN
INTRAMUSCULAR | Status: AC
  Filled 2023-02-05: qty 2

## 2023-02-05 MED ORDER — CEFAZOLIN SODIUM-DEXTROSE 2-4 GM/100ML-% IV SOLN
2.0000 g | INTRAVENOUS | Status: AC
  Administered 2023-02-05: 2 g via INTRAVENOUS
  Filled 2023-02-05: qty 100

## 2023-02-05 MED ORDER — LIDOCAINE 2% (20 MG/ML) 5 ML SYRINGE
INTRAMUSCULAR | Status: AC
  Filled 2023-02-05: qty 5

## 2023-02-05 MED ORDER — ACETAMINOPHEN 160 MG/5ML PO SOLN: 325.0000 mg | ORAL | Status: DC | PRN

## 2023-02-05 MED ORDER — ENSURE PRE-SURGERY PO LIQD: 592.0000 mL | Freq: Once | ORAL | Status: DC

## 2023-02-05 MED ORDER — PHENYLEPHRINE HCL-NACL 20-0.9 MG/250ML-% IV SOLN
INTRAVENOUS | Status: AC
  Filled 2023-02-05: qty 250

## 2023-02-05 MED ORDER — ONDANSETRON HCL 4 MG/2ML IJ SOLN
INTRAMUSCULAR | Status: DC | PRN
  Administered 2023-02-05: 4 mg via INTRAVENOUS

## 2023-02-05 MED ORDER — SODIUM CHLORIDE 0.9 % IV SOLN: INTRAVENOUS | Status: DC | PRN

## 2023-02-05 MED ORDER — FENTANYL CITRATE (PF) 250 MCG/5ML IJ SOLN
INTRAMUSCULAR | Status: DC | PRN
  Administered 2023-02-05: 50 ug via INTRAVENOUS
  Administered 2023-02-05: 100 ug via INTRAVENOUS

## 2023-02-05 MED ORDER — LACTATED RINGERS IV SOLN: INTRAVENOUS | Status: DC

## 2023-02-05 MED ORDER — FENTANYL CITRATE (PF) 100 MCG/2ML IJ SOLN
INTRAMUSCULAR | Status: AC
  Filled 2023-02-05: qty 2

## 2023-02-05 MED ORDER — NOREPINEPHRINE 4 MG/250ML-% IV SOLN
INTRAVENOUS | Status: AC
  Filled 2023-02-05: qty 250

## 2023-02-05 MED ORDER — HYDROCODONE-ACETAMINOPHEN 5-325 MG PO TABS: 1.0000 | ORAL_TABLET | Freq: Four times a day (QID) | ORAL | 0 refills | Status: DC | PRN

## 2023-02-05 MED ORDER — BUPIVACAINE-EPINEPHRINE (PF) 0.25% -1:200000 IJ SOLN
INTRAMUSCULAR | Status: AC
  Filled 2023-02-05: qty 30

## 2023-02-05 MED ORDER — ACETAMINOPHEN 325 MG PO TABS: 325.0000 mg | ORAL_TABLET | ORAL | Status: DC | PRN

## 2023-02-05 MED ORDER — OXYCODONE HCL 5 MG PO TABS: 5.0000 mg | ORAL_TABLET | Freq: Once | ORAL | Status: AC | PRN

## 2023-02-05 MED ORDER — FENTANYL CITRATE (PF) 100 MCG/2ML IJ SOLN
25.0000 ug | INTRAMUSCULAR | Status: DC | PRN
  Administered 2023-02-05: 50 ug via INTRAVENOUS

## 2023-02-05 MED ORDER — MIDAZOLAM HCL 2 MG/2ML IJ SOLN
INTRAMUSCULAR | Status: DC | PRN
  Administered 2023-02-05: 2 mg via INTRAVENOUS

## 2023-02-05 MED ORDER — LIDOCAINE 2% (20 MG/ML) 5 ML SYRINGE
INTRAMUSCULAR | Status: DC | PRN
  Administered 2023-02-05: 100 mg via INTRAVENOUS

## 2023-02-05 MED ORDER — ORAL CARE MOUTH RINSE: 15.0000 mL | Freq: Once | OROMUCOSAL | Status: AC

## 2023-02-05 MED ORDER — MIDAZOLAM HCL 2 MG/2ML IJ SOLN
INTRAMUSCULAR | Status: AC
  Filled 2023-02-05: qty 2

## 2023-02-05 MED ORDER — ROCURONIUM BROMIDE 10 MG/ML (PF) SYRINGE
PREFILLED_SYRINGE | INTRAVENOUS | Status: DC | PRN
  Administered 2023-02-05: 60 mg via INTRAVENOUS

## 2023-02-05 MED ORDER — BUPIVACAINE-EPINEPHRINE (PF) 0.25% -1:200000 IJ SOLN
INTRAMUSCULAR | Status: DC | PRN
  Administered 2023-02-05: 10 mL

## 2023-02-05 MED ORDER — LACTATED RINGERS IV SOLN: INTRAVENOUS | Status: DC | PRN

## 2023-02-05 MED ORDER — KETAMINE HCL 10 MG/ML IJ SOLN
INTRAMUSCULAR | Status: DC | PRN
  Administered 2023-02-05: 30 mg via INTRAVENOUS

## 2023-02-05 SURGICAL SUPPLY — 88 items
ADH SKN CLS APL DERMABOND .7 (GAUZE/BANDAGES/DRESSINGS) ×1
APL LAPSCP 35 DL APL RGD (MISCELLANEOUS)
APL PRP STRL LF DISP 70% ISPRP (MISCELLANEOUS) ×1
APPLICATOR VISTASEAL 35 (MISCELLANEOUS) IMPLANT
BAG COUNTER SPONGE SURGICOUNT (BAG) ×1 IMPLANT
BAG SPNG CNTER NS LX DISP (BAG) ×1
BLADE CLIPPER SURG (BLADE) IMPLANT
CANISTER SUCT 3000ML PPV (MISCELLANEOUS) IMPLANT
CHLORAPREP W/TINT 26 (MISCELLANEOUS) ×1 IMPLANT
CLIP LIGATING HEMO O LOK GREEN (MISCELLANEOUS) IMPLANT
CLIP LIGATING HEMOLOK MED (MISCELLANEOUS) IMPLANT
CLIP TI MEDIUM 6 (CLIP) IMPLANT
CLIP TI WIDE RED SMALL 24 (CLIP) IMPLANT
COVER SURGICAL LIGHT HANDLE (MISCELLANEOUS) ×1 IMPLANT
DERMABOND ADVANCED .7 DNX12 (GAUZE/BANDAGES/DRESSINGS) ×1 IMPLANT
DOPPLER CAUTERY SUPPRESSOR (INSTRUMENTS) ×1 IMPLANT
DRAIN CHANNEL 19F RND (DRAIN) IMPLANT
DRAIN PENROSE 0.5X18 (DRAIN) IMPLANT
DRAPE INCISE IOBAN 66X45 STRL (DRAPES) ×1 IMPLANT
DRAPE WARM FLUID 44X44 (DRAPES) ×1 IMPLANT
ELECT BLADE 6.5 EXT (BLADE) IMPLANT
ELECT CAUTERY BLADE 6.4 (BLADE) ×1 IMPLANT
ELECT PAD DSPR THERM+ ADLT (MISCELLANEOUS) ×1 IMPLANT
ELECT REM PT RETURN 9FT ADLT (ELECTROSURGICAL) ×1
ELECTRODE REM PT RTRN 9FT ADLT (ELECTROSURGICAL) ×1 IMPLANT
EVACUATOR SILICONE 100CC (DRAIN) IMPLANT
GLOVE BIOGEL PI IND STRL 6 (GLOVE) ×1 IMPLANT
GLOVE SURG POLY MICRO LF SZ5.5 (GLOVE) ×1 IMPLANT
GOWN STRL REUS W/ TWL LRG LVL3 (GOWN DISPOSABLE) ×3 IMPLANT
GOWN STRL REUS W/TWL LRG LVL3 (GOWN DISPOSABLE) ×3
HEMOSTAT SNOW SURGICEL 2X4 (HEMOSTASIS) ×1 IMPLANT
IRRIG SUCT STRYKERFLOW 2 WTIP (MISCELLANEOUS) ×1
IRRIGATION SUCT STRKRFLW 2 WTP (MISCELLANEOUS) ×1 IMPLANT
KIT BASIN OR (CUSTOM PROCEDURE TRAY) ×1 IMPLANT
KIT TURNOVER KIT B (KITS) ×1 IMPLANT
L-HOOK LAP DISP 36CM (ELECTROSURGICAL)
LHOOK LAP DISP 36CM (ELECTROSURGICAL) ×1 IMPLANT
LOOP VASCLR MAXI BLUE 18IN ST (MISCELLANEOUS) IMPLANT
NDL INSUFFLATION 14GA 120MM (NEEDLE) IMPLANT
NEEDLE INSUFFLATION 14GA 120MM (NEEDLE) ×1
NS IRRIG 1000ML POUR BTL (IV SOLUTION) ×2 IMPLANT
PAD ARMBOARD 7.5X6 YLW CONV (MISCELLANEOUS) ×2 IMPLANT
PENCIL BUTTON HOLSTER BLD 10FT (ELECTRODE) ×1 IMPLANT
POUCH LAPAROSCOPIC INSTRUMENT (MISCELLANEOUS) ×1 IMPLANT
PROBE LAPAROSCOPIC 5MM W/FTSWT (MISCELLANEOUS) ×1 IMPLANT
RELOAD STAPLE 60 2.6 WHT THN (STAPLE) IMPLANT
RELOAD STAPLE 60 3.6 BLU REG (STAPLE) IMPLANT
SEALER BIPOLAR AQUA END DBS8.7 (INSTRUMENTS) ×1 IMPLANT
SET TUBE SMOKE EVAC HIGH FLOW (TUBING) ×1 IMPLANT
SHEARS HARMONIC ACE PLUS 36CM (ENDOMECHANICALS) ×1 IMPLANT
SLEEVE Z-THREAD 5X100MM (TROCAR) ×1 IMPLANT
SPONGE T-LAP 18X18 ~~LOC~~+RFID (SPONGE) IMPLANT
STAPLE ECHEON FLEX 60 POW ENDO (STAPLE) IMPLANT
STAPLER RELOAD BLUE 60MM (STAPLE)
STAPLER RELOAD WHITE 60MM (STAPLE)
STAPLER VISISTAT 35W (STAPLE) ×1 IMPLANT
SUT ETHILON 1 LR 30 (SUTURE) IMPLANT
SUT ETHILON 2 0 FS 18 (SUTURE) IMPLANT
SUT MNCRL AB 4-0 PS2 18 (SUTURE) ×1 IMPLANT
SUT PDS AB 1 TP1 96 (SUTURE) IMPLANT
SUT PDS II 0 TP-1 LOOPED 60 (SUTURE) IMPLANT
SUT PROLENE 3 0 SH 48 (SUTURE) ×1 IMPLANT
SUT PROLENE 3 0 SH DA (SUTURE) ×1 IMPLANT
SUT PROLENE 4 0 RB 1 (SUTURE)
SUT PROLENE 4-0 RB1 .5 CRCL 36 (SUTURE) ×1 IMPLANT
SUT PROLENE 4-0 RB1 18X2 ARM (SUTURE) ×1 IMPLANT
SUT VIC AB 2-0 CT1 27 (SUTURE)
SUT VIC AB 2-0 CT1 TAPERPNT 27 (SUTURE) IMPLANT
SUT VIC AB 2-0 SH 18 (SUTURE) IMPLANT
SUT VIC AB 3-0 SH 18 (SUTURE) IMPLANT
SUT VICRYL AB 2 0 TIES (SUTURE) IMPLANT
SUT VICRYL AB 3 0 TIES (SUTURE) IMPLANT
SYS BAG RETRIEVAL 10MM (BASKET) ×1
SYS LAPSCP GELPORT 120MM (MISCELLANEOUS)
SYSTEM BAG RETRIEVAL 10MM (BASKET) ×1 IMPLANT
SYSTEM LAPSCP GELPORT 120MM (MISCELLANEOUS) IMPLANT
TOWEL GREEN STERILE (TOWEL DISPOSABLE) ×1 IMPLANT
TOWEL GREEN STERILE FF (TOWEL DISPOSABLE) ×1 IMPLANT
TRAY FOLEY MTR SLVR 16FR STAT (SET/KITS/TRAYS/PACK) IMPLANT
TRAY LAPAROSCOPIC MC (CUSTOM PROCEDURE TRAY) ×1 IMPLANT
TROCAR BALLN 12MMX100 BLUNT (TROCAR) ×1 IMPLANT
TROCAR Z THREAD OPTICAL 12X100 (TROCAR) IMPLANT
TROCAR Z-THREAD OPTICAL 5X100M (TROCAR) ×1 IMPLANT
TUBE CONNECTING 12X1/4 (SUCTIONS) IMPLANT
VASCULAR TIE MAXI BLUE 18IN ST (MISCELLANEOUS)
WARMER LAPAROSCOPE (MISCELLANEOUS) ×1 IMPLANT
WATER STERILE IRR 1000ML POUR (IV SOLUTION) ×1 IMPLANT
YANKAUER SUCT BULB TIP NO VENT (SUCTIONS) IMPLANT

## 2023-02-05 NOTE — Discharge Instructions (Addendum)
[facility] DISCHARGE INSTRUCTIONS  Activity No heavy lifting greater than 15 pounds for 4 weeks after surgery. Ok to shower in 24 hours, but do not bathe or submerge incisions underwater. Do not drive while taking narcotic pain medication. You may drive when you are no longer taking prescription pain medication, you can comfortably wear a seatbelt, and you can safely maneuver your car and apply brakes.  Wound Care Your incisions are covered with skin glue called Dermabond. This will peel off on its own over time. You may shower and allow warm soapy water to run over your incisions. Gently pat dry. Do not submerge your incision underwater until cleared by your surgeon. Monitor your incision for any new redness, tenderness, or drainage. Many patients will experience some swelling and bruising at the incisions.  Ice packs will help.  Swelling and bruising can take several days to resolve.   Medications A  prescription for pain medication may be given to you upon discharge.  Take your pain medication as prescribed, if needed.  If narcotic pain medicine is not needed, then you may take acetaminophen (Tylenol) or ibuprofen (Advil) as needed. It is common to experience some constipation if taking pain medication after surgery.  Increasing fluid intake and taking a stool softener (such as Colace) will usually help or prevent this problem from occurring.  A mild laxative (Milk of Magnesia or Miralax) should be taken according to package directions if there are no bowel movements after 48 hours. Take your usually prescribed medications unless otherwise directed. If you need a refill on your pain medication, please contact your pharmacy.  They will contact our office to request authorization. Prescriptions will not be filled after 5 pm or on weekends.  When to Call us: Fever greater than 100.5 New redness, drainage, or swelling at incision site Severe pain, nausea, or  vomiting Persistent bleeding from incisions  Follow-up You have an appointment scheduled with Dr. Freida Busman on February 23, 2023 at 1:50pm. This will be at the [facility] office at [address], [place], [place]. Please arrive at least 15 minutes prior to your scheduled appointment time.  The pathology results from your surgery usually take about 4-5 days to result. Dr. Freida Busman will call you once the results are available.  IF YOU HAVE DISABILITY OR FAMILY LEAVE FORMS, YOU MUST BRING THEM TO THE OFFICE FOR PROCESSING.   DO NOT GIVE THEM TO YOUR DOCTOR.  The clinic staff is available to answer your questions during regular business hours.  Please don't hesitate to call and ask to speak to one of the nurses for clinical concerns.  If you have a medical emergency, go to the nearest emergency room or call 911.  A surgeon from [facility] is always on call at the hospital  [address], [address], [place], [place]  [address] ?  [address] [address], [place], [place]   [address] [phone] ? Toll Free: [phone] ? FAX [phone] Web site: [email or web address]      Managing Your Pain After Surgery Without Opioids    Thank you for participating in our program to help patients manage their pain after surgery without opioids. This is part of our effort to provide you with the best care possible, without exposing you or your family to the risk that opioids pose.  What pain can I expect after surgery? You can expect to have some pain after surgery. This is normal. The pain is typically  worse the day after surgery, and quickly begins to get better. Many studies have found that many patients are able to manage their pain after surgery with Over-the-Counter (OTC) medications such as Tylenol and Motrin. If you have a condition that does not allow you to take Tylenol or Motrin, notify your surgical team.  How will I manage my pain? The best  strategy for controlling your pain after surgery is around the clock pain control with Tylenol (acetaminophen) and Motrin (ibuprofen or Advil). Alternating these medications with each other allows you to maximize your pain control. In addition to Tylenol and Motrin, you can use heating pads or ice packs on your incisions to help reduce your pain.  How will I alternate your regular strength over-the-counter pain medication? You will take a dose of pain medication every three hours. Start by taking 650 mg of Tylenol (2 pills of 325 mg) 3 hours later take 600 mg of Motrin (3 pills of 200 mg) 3 hours after taking the Motrin take 650 mg of Tylenol 3 hours after that take 600 mg of Motrin.   - 1 -  See example - if your first dose of Tylenol is at 12:00 PM   12:00 PM Tylenol 650 mg (2 pills of 325 mg)  3:00 PM Motrin 600 mg (3 pills of 200 mg)  6:00 PM Tylenol 650 mg (2 pills of 325 mg)  9:00 PM Motrin 600 mg (3 pills of 200 mg)  Continue alternating every 3 hours   We recommend that you follow this schedule around-the-clock for at least 3 days after surgery, or until you feel that it is no longer needed. Use the table on the last page of this handout to keep track of the medications you are taking. Important: Do not take more than 3000mg  of Tylenol or 3200mg  of Motrin in a 24-hour period. Do not take ibuprofen/Motrin if you have a history of bleeding stomach ulcers, severe kidney disease, &/or actively taking a blood thinner  What if I still have pain? If you have pain that is not controlled with the over-the-counter pain medications (Tylenol and Motrin or Advil) you might have what we call "breakthrough" pain. You will receive a prescription for a small amount of an opioid pain medication such as Oxycodone, Tramadol, or Tylenol with Codeine. Use these opioid pills in the first 24 hours after surgery if you have breakthrough pain. Do not take more than 1 pill every 4-6 hours.  If you still  have uncontrolled pain after using all opioid pills, don't hesitate to call our staff using the number provided. We will help make sure you are managing your pain in the best way possible, and if necessary, we can provide a prescription for additional pain medication.   Day 1    Time  Name of Medication Number of pills taken  Amount of Acetaminophen  Pain Level   Comments  AM PM       AM PM       AM PM       AM PM       AM PM       AM PM       AM PM       AM PM       Total Daily amount of Acetaminophen Do not take more than  3,000 mg per day      Day 2    Time  Name of Medication Number of pills taken  Amount  of Acetaminophen  Pain Level   Comments  AM PM       AM PM       AM PM       AM PM       AM PM       AM PM       AM PM       AM PM       Total Daily amount of Acetaminophen Do not take more than  3,000 mg per day      Day 3    Time  Name of Medication Number of pills taken  Amount of Acetaminophen  Pain Level   Comments  AM PM       AM PM       AM PM       AM PM         AM PM       AM PM       AM PM       AM PM       Total Daily amount of Acetaminophen Do not take more than  3,000 mg per day      Day 4    Time  Name of Medication Number of pills taken  Amount of Acetaminophen  Pain Level   Comments  AM PM       AM PM       AM PM       AM PM       AM PM       AM PM       AM PM       AM PM       Total Daily amount of Acetaminophen Do not take more than  3,000 mg per day      Day 5    Time  Name of Medication Number of pills taken  Amount of Acetaminophen  Pain Level   Comments  AM PM       AM PM       AM PM       AM PM       AM PM       AM PM       AM PM       AM PM       Total Daily amount of Acetaminophen Do not take more than  3,000 mg per day      Day 6    Time  Name of Medication Number of pills taken  Amount of Acetaminophen  Pain Level  Comments  AM PM       AM PM       AM PM       AM PM        AM PM       AM PM       AM PM       AM PM       Total Daily amount of Acetaminophen Do not take more than  3,000 mg per day      Day 7    Time  Name of Medication Number of pills taken  Amount of Acetaminophen  Pain Level   Comments  AM PM       AM PM       AM PM       AM PM       AM PM       AM PM  AM PM       AM PM       Total Daily amount of Acetaminophen Do not take more than  3,000 mg per day        For additional information about how and where to safely dispose of unused opioid medications - [email or web address]  Disclaimer: This document contains information and/or instructional materials adapted from [facility] for the typical patient with your condition. It does not replace medical advice from your health care provider because your experience may differ from that of the typical patient. Talk to your health care provider if you have any questions about this document, your condition or your treatment plan. Adapted from [facility]

## 2023-02-05 NOTE — Plan of Care (Signed)
 [facility] Tonsillectomy/Adenoidectomy, Postoperative PEDS care plan entered in error.

## 2023-02-05 NOTE — Anesthesia Procedure Notes (Signed)
Procedure Name: Intubation Date/Time: 02/05/2023 8:05 AM  Performed by: Debbe Odea, CRNAPre-anesthesia Checklist: Patient identified, Emergency Drugs available, Suction available and Patient being monitored Patient Re-evaluated:Patient Re-evaluated prior to induction Oxygen Delivery Method: Circle system utilized Preoxygenation: Pre-oxygenation with 100% oxygen Induction Type: IV induction Ventilation: Mask ventilation without difficulty Laryngoscope Size: Miller and 2 Grade View: Grade I Tube type: Oral Number of attempts: 1 Airway Equipment and Method: Stylet Placement Confirmation: ETT inserted through vocal cords under direct vision, positive ETCO2 and breath sounds checked- equal and bilateral Secured at: 23 cm Tube secured with: Tape Dental Injury: Teeth and Oropharynx as per pre-operative assessment

## 2023-02-05 NOTE — Anesthesia Postprocedure Evaluation (Signed)
Anesthesia Post Note  Patient: Terilynn Ruvalcaba  Procedure(s) Performed: LAPAROSCOPIC REMOVAL RIGHT RETROPERIONEAL MASS (Right)     Patient location during evaluation: PACU Anesthesia Type: General Level of consciousness: awake and alert Pain management: pain level controlled Vital Signs Assessment: post-procedure vital signs reviewed and stable Respiratory status: spontaneous breathing, nonlabored ventilation, respiratory function stable and patient connected to nasal cannula oxygen Cardiovascular status: blood pressure returned to baseline and stable Postop Assessment: no apparent nausea or vomiting Anesthetic complications: no   No notable events documented.  Last Vitals:  Vitals:   02/05/23 1000 02/05/23 1015  BP: (!) 162/80 (!) 157/88  Pulse: 76 73  Resp: 16 13  Temp:  (!) 36.3 C  SpO2: 96% 94%    Last Pain:  Vitals:   02/05/23 1015  TempSrc:   PainSc: 4                  Yolander Goodie

## 2023-02-05 NOTE — Interval H&P Note (Signed)
History and Physical Interval Note:  02/05/2023 7:17 AM  Renee Brady  has presented today for surgery, with the diagnosis of LIVER MASS.  The various methods of treatment have been discussed with the patient and family. After consideration of risks, benefits and other options for treatment, the patient has consented to  Procedure(s): LAPAROSCOPIC PARTIAL RIGHT HEPATECTOMY, POSSIBLE OPEN WITH INTRAOPERATIVE ULTRASOUND (Right) as a surgical intervention.  The patient's history has been reviewed, patient examined, no change in status, stable for surgery. Case was reviewed at [facility] yesterday 10/30. I have reviewed the patient's chart and labs.  Questions were answered to the patient's satisfaction.     Fritzi Mandes

## 2023-02-05 NOTE — Op Note (Signed)
Date: 02/05/23  Patient: Renee Brady MRN: [id number]  Preoperative Diagnosis: Posterior right liver mass Postoperative Diagnosis: Right retroperitoneal mass  Procedure: Laparoscopic excision of right retroperitoneal mass  Surgeon: Sophronia Simas, MD Assistant: Harriette Bouillon, MD  EBL: Minimal  Anesthesia: General endotracheal  Specimens: Right retroperitoneal mass  Indications: Ms. Pendergrast is a 47 yo female who presented with abdominal pain, for which she underwent a CT scan for workup. This incidentally showed a small mass adjacent to segment 6 of the liver. A follow up MRI was concerning for a possible malignancy such as cholangiocarcinoma. There was no evidence of metastatic disease. After a discussion of the risks and benefits of surgery, the patient agreed to proceed with resection.  Findings: Approximately 3cm mass in the right retroperitoneum adjacent to but not involving or originating from the right lobe of the liver.  Procedure details: Informed consent was obtained in the preoperative area prior to the procedure. The patient was brought to the operating room, general anesthesia was induced and appropriate lines and drains were placed for intraoperative monitoring. Perioperative antibiotics were administered per SCIP guidelines. The patient was placed in the left later decubitus position and the right flank was prepped and draped in the usual sterile fashion. A pre-procedure timeout was taken verifying patient identity, surgical site and procedure to be performed.  A small skin incision was made in the right lateral mid-abdomen, the fascia was grasped and elevated, and a Veress needle was inserted through the fascia.  Intraperitoneal placement was confirmed with the saline drop test and the abdomen was insufflated.  A 5 mm Visiport was placed and the abdomen was inspected with no evidence of visceral or vascular injury.  An additional 5 mm port was placed in the right flank,  followed by a 12mm port more medially in the right lateral costal margin, and a 5 mm port in the right upper quadrant, all under direct visualization.  The right lateral liver was gently elevated, and a well-circumscribed mass immediately became visible in the right retroperitoneum just posterior to the right lateral liver.  However the mass was not originating from the liver.  The surface of the liver was inspected and there were no suspicious masses on the liver surface.  There were no peritoneal nodules.  The right triangular ligament was partially taken down with harmonic shears to further retract the liver off the mass.  The mass was then dissected out of the retroperitoneum using harmonic shears.  The mass did not involve the right kidney, but was adjacent to the kidney within Gerota's fascia.  The mass was completely excised, placed in an Endo Catch bag, and extracted via the 12 mm port site and sent for routine pathology.  The surgical site was inspected and appeared completely hemostatic.  The 12mm port site fascia was closed with a 0 Vicryl figure-of-eight suture using a PMI suture passer.  The remaining ports were removed under direct visualization and the pneumoperitoneum was evacuated.  The port sites were closed with subcuticular 4-0 Monocryl suture.  Dermabond was applied.  The patient tolerated the procedure well with no apparent complications. All counts were correct x2 at the end of the procedure. The patient was extubated and taken to PACU in stable condition.  Sophronia Simas, MD 02/05/23 9:14 AM

## 2023-02-05 NOTE — Transfer of Care (Signed)
Immediate Anesthesia Transfer of Care Note  Patient: Renee Brady  Procedure(s) Performed: LAPAROSCOPIC REMOVAL RIGHT RETROPERIONEAL MASS (Right)  Patient Location: PACU  Anesthesia Type:General  Level of Consciousness: awake and sedated  Airway & Oxygen Therapy: Patient Spontanous Breathing and Patient connected to face mask oxygen  Post-op Assessment: Report given to RN and Post -op Vital signs reviewed and stable  Post vital signs: Reviewed and stable  Last Vitals:  Vitals Value Taken Time  BP 140/87 02/05/23 0935  Temp 36.1 C 02/05/23 0935  Pulse 82 02/05/23 0938  Resp 12 02/05/23 0938  SpO2 100 % 02/05/23 0938  Vitals shown include unfiled device data.  Last Pain:  Vitals:   02/05/23 0621  TempSrc:   PainSc: 0-No pain         Complications: No notable events documented.

## 2023-02-06 ENCOUNTER — Encounter (HOSPITAL_COMMUNITY): Payer: Self-pay | Admitting: Surgery

## 2023-02-08 LAB — BPAM RBC
Blood Product Expiration Date: 202411222359
Blood Product Expiration Date: 202411222359
Unit Type and Rh: 6200
Unit Type and Rh: 6200

## 2023-02-08 LAB — TYPE AND SCREEN
ABO/RH(D): A POS
Antibody Screen: NEGATIVE
Unit division: 0
Unit division: 0

## 2023-02-09 ENCOUNTER — Encounter
Payer: Medicare Other | Attending: Physical Medicine and Rehabilitation | Admitting: Physical Medicine and Rehabilitation

## 2023-02-09 DIAGNOSIS — M2550 Pain in unspecified joint: Secondary | ICD-10-CM | POA: Diagnosis not present

## 2023-02-09 NOTE — Addendum Note (Signed)
Addended by: Horton Chin on: 02/09/2023 11:52 AM   Modules accepted: Level of Service
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
## 2023-02-10 MED ORDER — LORATADINE 10 MG PO TABS: 10.0000 mg | ORAL_TABLET | Freq: Every day | ORAL | Status: AC

## 2023-02-10 NOTE — Addendum Note (Signed)
Addended by: [name] on: 02/10/2023 04:18 PM   Modules accepted: Orders

## 2023-02-12 LAB — SURGICAL PATHOLOGY

## 2023-02-16 DIAGNOSIS — M249 Joint derangement, unspecified: Secondary | ICD-10-CM

## 2023-02-16 HISTORY — DX: Joint derangement, unspecified: M24.9

## 2023-02-17 ENCOUNTER — Encounter: Payer: Self-pay | Admitting: Family

## 2023-02-24 ENCOUNTER — Ambulatory Visit: Payer: Medicare Other | Admitting: Neurology

## 2023-02-27 ENCOUNTER — Other Ambulatory Visit (HOSPITAL_BASED_OUTPATIENT_CLINIC_OR_DEPARTMENT_OTHER): Payer: Self-pay | Admitting: Family

## 2023-02-27 DIAGNOSIS — Z1231 Encounter for screening mammogram for malignant neoplasm of breast: Secondary | ICD-10-CM

## 2023-03-03 ENCOUNTER — Ambulatory Visit (HOSPITAL_BASED_OUTPATIENT_CLINIC_OR_DEPARTMENT_OTHER)
Admission: RE | Admit: 2023-03-03 | Discharge: 2023-03-03 | Disposition: A | Discharge status: Home / Self Care | Payer: Medicare Other | Source: Ambulatory Visit | Attending: Family | Admitting: Family

## 2023-03-03 ENCOUNTER — Encounter (HOSPITAL_BASED_OUTPATIENT_CLINIC_OR_DEPARTMENT_OTHER): Payer: Self-pay

## 2023-03-03 DIAGNOSIS — Z1231 Encounter for screening mammogram for malignant neoplasm of breast: Secondary | ICD-10-CM | POA: Insufficient documentation

## 2023-03-12 ENCOUNTER — Other Ambulatory Visit: Payer: Self-pay | Admitting: Family

## 2023-03-16 ENCOUNTER — Ambulatory Visit (HOSPITAL_BASED_OUTPATIENT_CLINIC_OR_DEPARTMENT_OTHER)
Admission: RE | Admit: 2023-03-16 | Discharge: 2023-03-16 | Disposition: A | Discharge status: Home / Self Care | Payer: Medicare Other | Source: Ambulatory Visit | Attending: Family | Admitting: Family

## 2023-03-16 ENCOUNTER — Encounter (HOSPITAL_BASED_OUTPATIENT_CLINIC_OR_DEPARTMENT_OTHER): Payer: Self-pay

## 2023-03-16 DIAGNOSIS — Z1231 Encounter for screening mammogram for malignant neoplasm of breast: Secondary | ICD-10-CM | POA: Diagnosis present

## 2023-03-23 ENCOUNTER — Other Ambulatory Visit: Payer: Self-pay | Admitting: Cardiology

## 2023-03-23 DIAGNOSIS — E782 Mixed hyperlipidemia: Secondary | ICD-10-CM

## 2023-03-23 MED ORDER — ATORVASTATIN CALCIUM 20 MG PO TABS: 20.0000 mg | ORAL_TABLET | Freq: Every day | ORAL | 3 refills | Status: DC

## 2023-03-23 NOTE — Telephone Encounter (Signed)
Rx refill sent to pharmacy. 

## 2023-03-26 ENCOUNTER — Other Ambulatory Visit: Payer: Self-pay | Admitting: Family

## 2023-03-30 ENCOUNTER — Ambulatory Visit: Payer: Medicare Other | Admitting: Internal Medicine

## 2023-04-09 ENCOUNTER — Encounter: Payer: Self-pay | Admitting: Family

## 2023-04-09 MED ORDER — AMITRIPTYLINE HCL 50 MG PO TABS: ORAL_TABLET | ORAL | 1 refills | Status: DC

## 2023-04-22 ENCOUNTER — Encounter: Payer: Self-pay | Admitting: Physical Medicine and Rehabilitation

## 2023-04-22 MED ORDER — TOPIRAMATE 25 MG PO TABS: 50.0000 mg | ORAL_TABLET | Freq: Every day | ORAL | 3 refills | Status: DC | PRN

## 2023-04-24 ENCOUNTER — Other Ambulatory Visit: Payer: Self-pay | Admitting: Physical Medicine and Rehabilitation

## 2023-04-24 MED ORDER — TOPIRAMATE 50 MG PO TABS: 50.0000 mg | ORAL_TABLET | Freq: Every day | ORAL | 3 refills | Status: AC

## 2023-05-07 ENCOUNTER — Other Ambulatory Visit: Payer: Self-pay | Admitting: Cardiology

## 2023-05-07 NOTE — Telephone Encounter (Signed)
RX sent

## 2023-06-07 ENCOUNTER — Other Ambulatory Visit: Payer: Self-pay | Admitting: Family

## 2023-06-09 ENCOUNTER — Ambulatory Visit: Payer: Medicare Other | Admitting: Internal Medicine

## 2023-06-17 ENCOUNTER — Ambulatory Visit

## 2023-06-20 ENCOUNTER — Other Ambulatory Visit: Payer: Self-pay | Admitting: Internal Medicine

## 2023-06-24 DIAGNOSIS — I10 Essential (primary) hypertension: Secondary | ICD-10-CM | POA: Insufficient documentation

## 2023-06-24 DIAGNOSIS — M545 Low back pain, unspecified: Secondary | ICD-10-CM | POA: Insufficient documentation

## 2023-06-24 DIAGNOSIS — Q6589 Other specified congenital deformities of hip: Secondary | ICD-10-CM | POA: Insufficient documentation

## 2023-06-24 DIAGNOSIS — K589 Irritable bowel syndrome without diarrhea: Secondary | ICD-10-CM | POA: Insufficient documentation

## 2023-06-24 DIAGNOSIS — M4180 Other forms of scoliosis, site unspecified: Secondary | ICD-10-CM | POA: Insufficient documentation

## 2023-06-24 DIAGNOSIS — K2990 Gastroduodenitis, unspecified, without bleeding: Secondary | ICD-10-CM | POA: Insufficient documentation

## 2023-06-24 DIAGNOSIS — H1045 Other chronic allergic conjunctivitis: Secondary | ICD-10-CM | POA: Insufficient documentation

## 2023-06-24 DIAGNOSIS — K5790 Diverticulosis of intestine, part unspecified, without perforation or abscess without bleeding: Secondary | ICD-10-CM | POA: Insufficient documentation

## 2023-06-30 ENCOUNTER — Ambulatory Visit: Payer: Medicare Other | Attending: Cardiology | Admitting: Cardiology

## 2023-06-30 ENCOUNTER — Encounter: Payer: Self-pay | Admitting: Cardiology

## 2023-06-30 VITALS — BP 120/100 | HR 96 | Ht 65.0 in | Wt 228.0 lb

## 2023-06-30 DIAGNOSIS — I209 Angina pectoris, unspecified: Secondary | ICD-10-CM | POA: Diagnosis present

## 2023-06-30 DIAGNOSIS — E669 Obesity, unspecified: Secondary | ICD-10-CM | POA: Insufficient documentation

## 2023-06-30 DIAGNOSIS — I2510 Atherosclerotic heart disease of native coronary artery without angina pectoris: Secondary | ICD-10-CM | POA: Diagnosis present

## 2023-06-30 DIAGNOSIS — I10 Essential (primary) hypertension: Secondary | ICD-10-CM | POA: Insufficient documentation

## 2023-06-30 DIAGNOSIS — I259 Chronic ischemic heart disease, unspecified: Secondary | ICD-10-CM | POA: Diagnosis present

## 2023-06-30 DIAGNOSIS — I25118 Atherosclerotic heart disease of native coronary artery with other forms of angina pectoris: Secondary | ICD-10-CM

## 2023-06-30 DIAGNOSIS — E782 Mixed hyperlipidemia: Secondary | ICD-10-CM | POA: Insufficient documentation

## 2023-06-30 MED ORDER — NITROGLYCERIN 0.4 MG SL SUBL: 0.4000 mg | SUBLINGUAL_TABLET | SUBLINGUAL | 3 refills | Status: DC | PRN

## 2023-06-30 MED ORDER — METOPROLOL SUCCINATE ER 50 MG PO TB24: 50.0000 mg | ORAL_TABLET | Freq: Every day | ORAL | 3 refills | Status: DC

## 2023-06-30 MED ORDER — METOPROLOL TARTRATE 100 MG PO TABS: 100.0000 mg | ORAL_TABLET | Freq: Once | ORAL | 0 refills | Status: DC

## 2023-06-30 NOTE — Progress Notes (Signed)
 Cardiology Office Note:    Date:  06/30/2023   ID:  Renee Brady, DOB 1976-02-10, MRN [id number]  PCP:  [name], NP  Cardiologist:  Garwin Brothers, MD   Referring MD: [name], NP    ASSESSMENT:    1. Essential hypertension   2. Angina pectoris (HCC)   3. Coronary artery disease involving native coronary artery of native heart without angina pectoris   4. Obesity (BMI 35.0-39.9 without comorbidity)   5. Mixed hyperlipidemia    PLAN:    In order of problems listed above:  Angina pectoris: Patient has known coronary artery disease.  This is concerning.  Following recommendations were made to the patient.  She was advised to take a coated baby aspirin on a daily basis.  Sublingual nitroglycerin prescription was sent, its protocol and 911 protocol explained and the patient vocalized understanding questions were answered to the patient's satisfaction.  Several modalities of testing were discussed she prefers CT coronary angiography.  We will schedule her for this. Essential hypertension: Blood pressure stable and diet was emphasized.  I increased beta-blocker to metoprolol succinate 50 mg daily. Mixed dyslipidemia: On lipid-lowering medications followed by primary care. Cardiac murmur: Echocardiogram will be done to assess murmur heard on auscultation. Obesity: Diet emphasized.  Weight reduction stressed and patient promises to do better. Patient will be seen in follow-up appointment in 6 months or earlier if the patient has any concerns.    Medication Adjustments/Labs and Tests Ordered: Current medicines are reviewed at length with the patient today.  Concerns regarding medicines are outlined above.  Orders Placed This Encounter  Procedures   EKG 12-Lead   No orders of the defined types were placed in this encounter.    Chief Complaint  Patient presents with   Follow-up     History of Present Illness:    Renee Brady is a 48 y.o. female.   Patient has past medical history of coronary artery disease, essential hypertension, mixed dyslipidemia and obesity.  She mentions to me that she has chest tightness at times with stress.  No orthopnea or PND.  No radiation of the symptoms to any part of the body.  She is concerned about the symptoms.  At the time of my evaluation, the patient is alert awake oriented and in no distress.  She is not ambulating much because of orthopedic issues in her foot.  Past Medical History:  Diagnosis Date   ADHD    Allergic conjunctivitis    Allergic rhinitis 09/29/2022   ANA positive 06/26/2014   Formatting of this note might be different from the original. Overview:  Evaluated by Rheumatology Dr. Herma Carson - dx fibromyalgia given.   Angina pectoris (HCC) 05/25/2020   Ankle pain, left 08/27/2014   Formatting of this note might be different from the original. Overview:  Fell on this day with injury to Lt foot and ankle.   Anxiety and depression 04/03/2015   Asthma    Bipolar 2 disorder, major depressive episode (HCC)    suspected due to being on SSRIs and causing irritability   CAD (coronary artery disease) 07/03/2020   Cervical radiculitis 10/23/2016   Chronic allergic conjunctivitis    Chronic pain of left knee 09/18/2014   Formatting of this note might be different from the original. Overview:  Evaluated by Ortho.   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 PT, Life-style modification, and NSAID PRN.   Prediabetes 10/18/2021   PTSD (post-traumatic stress disorder)    Raynaud's disease 06/05/2014   RBC microcytosis    Sensitivity to medication 04/24/2015    Includes all SSRI's (Prozac, Paxil, Lexapro, Zoloft).    Also Wellbutrin, hydroxyzine, and gabapentin.   Reactions are paradoxical in nature with hyper-stimulatory response,   mood changes, and panic attacks.   Amitriptyline has been tolerated well (2016).     Spinal stenosis in cervical region 10/23/2016   Subcutaneous nodule of chest wall 04/14/2022   Trochanteric bursitis of right hip 07/13/2014   Formatting of this note might be different from the original. Evaluated by Ortho.  Tx with CSI and PT.   Urinary urgency 01/29/2018   Vision abnormalities    Vitamin D deficiency 07/28/2014   Formatting of this note might be different from the original. Daily supplement.    Past Surgical History:  Procedure Laterality Date   ABDOMINAL HYSTERECTOMY     uterus and cervix removed but still has ovaries   CESAREAN SECTION  x 2   COLONOSCOPY  12/28/2009   Mild sigmoid diverticulosis. Small internal hemorrhoids.   ESOPHAGOGASTRODUODENOSCOPY  12/28/2009   Mild gastroduodenitis. Otherwise  normal EGD   LAPAROSCOPIC REMOVAL ABDOMINAL MASS Right 02/05/2023   Procedure: LAPAROSCOPIC REMOVAL RIGHT RETROPERIONEAL MASS;  Surgeon: Fritzi Mandes, MD;  Location: [facility] OR;  Service: General;  Laterality: Right;   LEFT HEART CATH AND CORONARY ANGIOGRAPHY N/A 06/08/2020   Procedure: LEFT HEART CATH AND CORONARY ANGIOGRAPHY;  Surgeon: Kathleene Hazel, MD;  Location: [facility];  Service: Cardiovascular;  Laterality: N/A;   lumbar laminectomy Left 11/02/2016   TONSILLECTOMY     WISDOM TOOTH EXTRACTION      Current Medications: Current Meds  Medication Sig   albuterol (VENTOLIN HFA) 108 (90 Base) MCG/ACT inhaler Inhale 2 puffs into the lungs every 6 (six) hours as needed for wheezing or shortness of breath.   ALPRAZolam (XANAX) 0.5 MG tablet Take 0.5 mg by mouth daily as needed for anxiety.   amitriptyline (ELAVIL) 50 MG tablet TAKE 1 TABLET BY MOUTH EVERYDAY AT BEDTIME   aspirin EC 81 MG tablet Take 1 tablet (81 mg total) by mouth daily. Swallow whole.   atorvastatin (LIPITOR) 20 MG tablet  Take 1 tablet (20 mg total) by mouth daily.   azelastine (ASTELIN) 0.1 % nasal spray Place 1-2 sprays into both nostrils 2 (two) times daily as needed. (Patient taking differently: Place 1-2 sprays into both nostrils in the morning.)   Cholecalciferol (VITAMIN D3) 25 MCG (1000 UT) CAPS Take 1,000 Units by mouth in the morning.   cromolyn (OPTICROM) 4 % ophthalmic solution Place 1 drop into both eyes 4 (four) times daily. (Patient taking differently: Place 1 drop into both eyes daily.)   fluticasone furoate-vilanterol (BREO ELLIPTA) 100-25 MCG/ACT AEPB Inhale 1 puff into the lungs daily.   HYDROcodone-acetaminophen (NORCO/VICODIN) 5-325 MG tablet Take 1 tablet by mouth every 6 (six) hours as needed for severe pain (pain score 7-10).   hydrOXYzine (ATARAX) 25 MG tablet TAKE 1 TABLET BY MOUTH 3 TIMES DAILY AS NEEDED FOR ITCHING.   ipratropium-albuterol (DUONEB) 0.5-2.5 (3) MG/3ML SOLN Take 3  mLs by nebulization every 6 (six) hours as needed (wheezing).   isosorbide mononitrate (IMDUR) 30 MG 24 hr tablet TAKE 1 TABLET BY MOUTH EVERY DAY   loratadine (CLARITIN) 10 MG tablet Take 1 tablet (10 mg total) by mouth daily.   methocarbamol (ROBAXIN) 500 MG tablet Take 1 tablet (500 mg total) by mouth every 6 (six) hours as needed for muscle spasms.   metoprolol succinate (TOPROL-XL) 25 MG 24 hr tablet TAKE 1 TABLET (25 MG TOTAL) BY MOUTH DAILY.   montelukast (SINGULAIR) 10 MG tablet TAKE 1 TABLET BY MOUTH EVERYDAY AT BEDTIME   NIFEdipine (PROCARDIA-XL/NIFEDICAL-XL) 30 MG 24 hr tablet Take 1 tablet (30 mg total) by mouth daily.   nitroGLYCERIN (NITROSTAT) 0.4 MG SL tablet Place 1 tablet (0.4 mg total) under the tongue every 5 (five) minutes as needed for chest pain.   omega-3 acid ethyl esters (LOVAZA) 1 g capsule Take 1 g by mouth daily.   ondansetron (ZOFRAN) 4 MG tablet Take 1 tablet (4 mg total) by mouth daily as needed for nausea or vomiting.   pantoprazole (PROTONIX) 40 MG tablet Take 1 tablet (40 mg total) by mouth daily.   topiramate (TOPAMAX) 50 MG tablet Take 1 tablet (50 mg total) by mouth daily.   triamcinolone cream (KENALOG) 0.1 % Apply 1 Application topically 2 (two) times daily. (Patient taking differently: Apply 1 Application topically 2 (two) times daily as needed (skin irritation/rash.).)   zolpidem (AMBIEN) 5 MG tablet Take 5 mg by mouth at bedtime.     Allergies:   Inderal [propranolol], Lactose, Lactose intolerance (gi), Latex, Mobic [meloxicam], Neurontin [gabapentin], Oxycodone hcl, Percocet [oxycodone-acetaminophen], Prozac [fluoxetine hcl], Serotonin, Serotonin reuptake inhibitors (ssris), Tecfidera [dimethyl fumarate], Tramadol, Trospium chloride, Wixela inhub [fluticasone-salmeterol], Zoloft [sertraline], Buspirone hcl, Hydrochlorothiazide, and Penicillins   Social History   Socioeconomic History   Marital status: Married    Spouse name: Not on file   Number of  children: 2   Years of education: Not on file   Highest education level: Associate degree: academic program  Occupational History   Not on file  Tobacco Use   Smoking status: Former    Current packs/day: 0.00    Types: Cigarettes    Quit date: 2005    Years since quitting: 20.2   Smokeless tobacco: Never  Vaping Use   Vaping status: Never Used  Substance and Sexual Activity   Alcohol use: No   Drug use: Yes    Types: Marijuana    Comment: marijuana use daily   Sexual activity: Yes    Birth control/protection: Surgical  Other Topics Concern   Not on file  Social History Narrative   One son, one daughter   Associates degree in business   One son in the army and one daughter   2 grand daughters   Married   Enjoys, reading, walking, stationary bike   Social Drivers of Corporate investment banker Strain: Not on file  Food Insecurity: Not on file  Transportation Needs: Not on file  Physical Activity: Not on file  Stress: Not on file  Social Connections: Not on file     Family History: The patient's family history includes ADD / ADHD in her daughter and father; Allergic rhinitis in her daughter and son; Asthma in her daughter, mother, and son; Bone cancer in her maternal grandmother; CAD in her paternal grandfather; Cardiomyopathy in her mother; Depression in her father; Diabetes in her brother and mother; Eczema in her granddaughter and son; Esophageal cancer in her maternal uncle; Heart attack in her paternal grandmother; Hypertension in her father and mother; Kidney disease in her father; Prostate cancer in her paternal grandfather; Stroke in her mother. There is no history of Colon cancer, Rectal cancer, Stomach cancer, or Immunodeficiency.  ROS:   Please see the history of present illness.    All other systems reviewed and are negative.  EKGs/Labs/Other Studies Reviewed:    The following studies were reviewed today: .Marland Kitchen   EKG reveals sinus rhythm poor anterior forces  and nonspecific ST-T changes   Recent Labs: 01/13/2023: ALT 17; BUN 11; Creatinine, Ser 0.95; Hemoglobin 12.1; Platelets 299.0; Potassium 4.0; Sodium 138  Recent Lipid Panel    Component Value Date/Time   CHOL 143 01/26/2023 1014   CHOL 138 01/20/2022 0843   TRIG 54.0 01/26/2023 1014   HDL 54.30 01/26/2023 1014   HDL 56 01/20/2022 0843   CHOLHDL 3 01/26/2023 1014   VLDL 10.8 01/26/2023 1014   LDLCALC 78 01/26/2023 1014   LDLCALC 67 01/20/2022 0843    Physical Exam:    VS:  BP (!) 120/100   Pulse 96   Ht 5\' 5"  (1.651 m)   Wt 228 lb (103.4 kg)   SpO2 98%   BMI 37.94 kg/m     Wt Readings from Last 3 Encounters:  06/30/23 228 lb (103.4 kg)  02/05/23 225 lb (102.1 kg)  01/30/23 226 lb (102.5 kg)     GEN: Patient is in no acute distress HEENT: Normal NECK: No JVD; No carotid bruits LYMPHATICS: No lymphadenopathy CARDIAC: Hear sounds regular, 2/6 systolic murmur at the apex. RESPIRATORY:  Clear to auscultation without rales, wheezing or rhonchi  ABDOMEN: Soft, non-tender, non-distended MUSCULOSKELETAL:  No edema; No deformity  SKIN: Warm and dry NEUROLOGIC:  Alert and oriented x 3 PSYCHIATRIC:  Normal affect   Signed, Garwin Brothers, MD  06/30/2023 11:06 AM    [facility]

## 2023-06-30 NOTE — Patient Instructions (Signed)
 Medication Instructions:  Your physician has recommended you make the following change in your medication:   Increase your Metoprolol to 50 mg daily  *If you need a refill on your cardiac medications before your next appointment, please call your pharmacy*   Lab Work: Your physician recommends that you have a BMP and TSH today in the office.    If you have labs (blood work) drawn today and your tests are completely normal, you will receive your results only by: MyChart Message (if you have MyChart) OR A paper copy in the mail If you have any lab test that is abnormal or we need to change your treatment, we will call you to review the results.    Testing/Procedures:   Your cardiac CT will be scheduled at the following location:   [facility] [address], [address] [place],  [place]  [address]  Main: [phone]  Hold all erectile dysfunction medications at least 3 days (72 hrs) prior to test. (Ie viagra, cialis, sildenafil, tadalafil, etc) We will administer nitroglycerin during this exam.   On the Night Before the Test: Be sure to Drink plenty of water. Do not consume any caffeinated/decaffeinated beverages or chocolate 12 hours prior to your test. Do not take any antihistamines 12 hours prior to your test.   On the Day of the Test: Drink plenty of water until 1 hour prior to the test. Do not eat any food 1 hour prior to test. You may take your regular medications prior to the test.  Take metoprolol (Lopressor) 100 mg two hours prior to test. Hold your Toprol dose FEMALES- please wear underwire-free bra if available, avoid dresses & tight clothing       After the Test: Drink plenty of water. After receiving IV contrast, you may experience a mild flushed feeling. This is normal. On occasion, you may experience a mild rash up to 24 hours after the test. This is not dangerous. If this occurs, you can take Benadryl 25 mg  and increase your fluid intake. If you experience trouble breathing, this can be serious. If it is severe call 911 IMMEDIATELY. If it is mild, please call our office. If you take any of these medications: Glipizide/Metformin, Avandament, Glucavance, please do not take 48 hours after completing test unless otherwise instructed.  We will call to schedule your test 2-4 weeks out understanding that some insurance companies will need an authorization prior to the service being performed.   For non-scheduling related questions, please contact the cardiac imaging nurse navigator should you have any questions/concerns: Rockwell Alexandria, Cardiac Imaging Nurse Navigator Larey Brick, Cardiac Imaging Nurse Navigator [facility] Direct Office Dial: [phone]   For scheduling needs, including cancellations and rescheduling, please call [place], [phone].   Your physician has requested that you have an echocardiogram. Echocardiography is a painless test that uses sound waves to create images of your heart. It provides your doctor with information about the size and shape of your heart and how well your heart's chambers and valves are working. This procedure takes approximately one hour. There are no restrictions for this procedure. Please do NOT wear cologne, perfume, aftershave, or lotions (deodorant is allowed). Please arrive 15 minutes prior to your appointment time.   Your next appointment:   9 month(s)  The format for your next appointment:   In Person  Provider:   Belva Crome, MD   Other Instructions Cardiac CT  Angiogram A cardiac CT angiogram is a procedure to look at the heart and the area around the heart. It may be done to help find the cause of chest pains or other symptoms of heart disease. During this procedure, a substance called contrast dye is injected into the blood vessels in the area to be checked. A large X-ray machine, called a CT scanner,  then takes detailed pictures of the heart and the surrounding area. The procedure is also sometimes called a coronary CT angiogram, coronary artery scanning, or CTA. A cardiac CT angiogram allows the health care provider to see how well blood is flowing to and from the heart. The health care provider will be able to see if there are any problems, such as: Blockage or narrowing of the coronary arteries in the heart. Fluid around the heart. Signs of weakness or disease in the muscles, valves, and tissues of the heart. Tell a health care provider about: Any allergies you have. This is especially important if you have had a previous allergic reaction to contrast dye. All medicines you are taking, including vitamins, herbs, eye drops, creams, and over-the-counter medicines. Any blood disorders you have. Any surgeries you have had. Any medical conditions you have. Whether you are pregnant or may be pregnant. Any anxiety disorders, chronic pain, or other conditions you have that may increase your stress or prevent you from lying still. What are the risks? Generally, this is a safe procedure. However, problems may occur, including: Bleeding. Infection. Allergic reactions to medicines or dyes. Damage to other structures or organs. Kidney damage from the contrast dye that is used. Increased risk of cancer from radiation exposure. This risk is low. Talk with your health care provider about: The risks and benefits of testing. How you can receive the lowest dose of radiation. What happens before the procedure? Wear comfortable clothing and remove any jewelry, glasses, dentures, and hearing aids. Follow instructions from your health care provider about eating and drinking. This may include: For 12 hours before the procedure -- avoid caffeine. This includes tea, coffee, soda, energy drinks, and diet pills. Drink plenty of water or other fluids that do not have caffeine in them. Being well hydrated can  prevent complications. For 4-6 hours before the procedure -- stop eating and drinking. The contrast dye can cause nausea, but this is less likely if your stomach is empty. Ask your health care provider about changing or stopping your regular medicines. This is especially important if you are taking diabetes medicines, blood thinners, or medicines to treat problems with erections (erectile dysfunction). What happens during the procedure?  Hair on your chest may need to be removed so that small sticky patches called electrodes can be placed on your chest. These will transmit information that helps to monitor your heart during the procedure. An IV will be inserted into one of your veins. You might be given a medicine to control your heart rate during the procedure. This will help to ensure that good images are obtained. You will be asked to lie on an exam table. This table will slide in and out of the CT machine during the procedure. Contrast dye will be injected into the IV. You might feel warm, or you may get a metallic taste in your mouth. You will be given a medicine called nitroglycerin. This will relax or dilate the arteries in your heart. The table that you are lying on will move into the CT machine tunnel for the scan. The person  running the machine will give you instructions while the scans are being done. You may be asked to: Keep your arms above your head. Hold your breath. Stay very still, even if the table is moving. When the scanning is complete, you will be moved out of the machine. The IV will be removed. The procedure may vary among health care providers and hospitals. What can I expect after the procedure? After your procedure, it is common to have: A metallic taste in your mouth from the contrast dye. A feeling of warmth. A headache from the nitroglycerin. Follow these instructions at home: Take over-the-counter and prescription medicines only as told by your health care  provider. If you are told, drink enough fluid to keep your urine pale yellow. This will help to flush the contrast dye out of your body. Most people can return to their normal activities right after the procedure. Ask your health care provider what activities are safe for you. It is up to you to get the results of your procedure. Ask your health care provider, or the department that is doing the procedure, when your results will be ready. Keep all follow-up visits as told by your health care provider. This is important. Contact a health care provider if: You have any symptoms of allergy to the contrast dye. These include: Shortness of breath. Rash or hives. A racing heartbeat. Summary A cardiac CT angiogram is a procedure to look at the heart and the area around the heart. It may be done to help find the cause of chest pains or other symptoms of heart disease. During this procedure, a large X-ray machine, called a CT scanner, takes detailed pictures of the heart and the surrounding area after a contrast dye has been injected into blood vessels in the area. Ask your health care provider about changing or stopping your regular medicines before the procedure. This is especially important if you are taking diabetes medicines, blood thinners, or medicines to treat erectile dysfunction. If you are told, drink enough fluid to keep your urine pale yellow. This will help to flush the contrast dye out of your body. This information is not intended to replace advice given to you by your health care provider. Make sure you discuss any questions you have with your health care provider. Document Revised: 11/17/2018 Document Reviewed: 11/17/2018 Elsevier Patient Education  [employer].  Echocardiogram An echocardiogram is a test that uses sound waves to make images of your heart. This way of making images is often called ultrasound. The images from this test can help find out many things about your  heart, including: The size and shape of your heart. The strength of your heart muscle and how well it's working. The size, thickness, and movement of your heart's walls. How your heart valves are working. Problems such as: A tumor or a growth from an infection around the heart valves. Areas of heart muscle that aren't working well because of poor blood flow or injury from a heart attack. An aneurysm. This is a weak or damaged part of an artery wall. An artery is a blood vessel. Tell a health care provider about: Any allergies you have. All medicines you're taking, including vitamins, herbs, eye drops, creams, and over-the-counter medicines. Any bleeding problems you have. Any surgeries you've had. Any medical problems you have. Whether you're pregnant or may be pregnant. What are the risks? Your health care provider will talk with you about risks. These may include an allergic reaction  to IV dye that may be used during the test. What happens before the test? You don't need to do anything to get ready for this test. You may eat and drink normally. What happens during the test?  You'll take off your clothes from the waist up and put on a hospital gown. Sticky patches called electrodes may be placed on your chest. These will be connected to a machine that monitors your heart rate and rhythm. You'll lie down on a table for the exam. A wand covered in gel will be moved over your chest. Sound waves from the wand will go to your heart and bounce back--or "echo" back. The sound waves will go to a computer that uses them to make images of your heart. The images can be viewed on a monitor. The images will also be recorded on the computer so your provider can look at them later. You may be asked to change positions or hold your breath for a short time. This makes it easier to get different views or better views of your heart. In some cases, you may be given a dye through an IV. The IV is put into  one of your veins. This dye can make the areas of your heart easier to see. The procedure may vary among providers and hospitals. What can I expect after the test? You may return to your normal diet, activities, and medicines unless your provider tells you not to. If an IV was placed for the test, it will be removed. It's up to you to get the results of your test. Ask your provider, or the department that's doing the test, when your results will be ready. This information is not intended to replace advice given to you by your health care provider. Make sure you discuss any questions you have with your health care provider. Document Revised: 05/23/2022 Document Reviewed: 05/23/2022 Elsevier Patient Education  2024 [employer].

## 2023-07-01 ENCOUNTER — Ambulatory Visit: Attending: Cardiology

## 2023-07-01 ENCOUNTER — Encounter: Payer: Self-pay | Admitting: Family

## 2023-07-01 DIAGNOSIS — I209 Angina pectoris, unspecified: Secondary | ICD-10-CM | POA: Insufficient documentation

## 2023-07-01 DIAGNOSIS — I25119 Atherosclerotic heart disease of native coronary artery with unspecified angina pectoris: Secondary | ICD-10-CM | POA: Diagnosis not present

## 2023-07-01 DIAGNOSIS — I2510 Atherosclerotic heart disease of native coronary artery without angina pectoris: Secondary | ICD-10-CM | POA: Insufficient documentation

## 2023-07-01 LAB — BASIC METABOLIC PANEL
BUN/Creatinine Ratio: 12 (ref 9–23)
BUN: 14 mg/dL (ref 6–24)
CO2: 22 mmol/L (ref 20–29)
Calcium: 9.4 mg/dL (ref 8.7–10.2)
Chloride: 104 mmol/L (ref 96–106)
Creatinine, Ser: 1.19 mg/dL — ABNORMAL HIGH (ref 0.57–1.00)
Glucose: 105 mg/dL — ABNORMAL HIGH (ref 70–99)
Potassium: 4.3 mmol/L (ref 3.5–5.2)
Sodium: 141 mmol/L (ref 134–144)
eGFR: 56 mL/min/{1.73_m2} — ABNORMAL LOW (ref >59)

## 2023-07-01 LAB — TSH: TSH: 1.61 u[IU]/mL (ref 0.450–4.500)

## 2023-07-02 ENCOUNTER — Encounter: Payer: Self-pay | Admitting: Family

## 2023-07-02 LAB — ECHOCARDIOGRAM COMPLETE
Calc EF: 41.9 %
S' Lateral: 3.3 cm
Single Plane A2C EF: 42.8 %
Single Plane A4C EF: 43.5 %

## 2023-07-03 ENCOUNTER — Encounter: Payer: Self-pay | Admitting: Internal Medicine

## 2023-07-03 NOTE — Telephone Encounter (Signed)
 We havent seen her since September.  Let her make an appointment so we can evaluate her asthma and figure out appropriate next inhaler

## 2023-07-06 ENCOUNTER — Ambulatory Visit

## 2023-07-06 ENCOUNTER — Ambulatory Visit (INDEPENDENT_AMBULATORY_CARE_PROVIDER_SITE_OTHER): Admitting: *Deleted

## 2023-07-06 DIAGNOSIS — Z0000 Encounter for general adult medical examination without abnormal findings: Secondary | ICD-10-CM | POA: Diagnosis not present

## 2023-07-06 DIAGNOSIS — M8580 Other specified disorders of bone density and structure, unspecified site: Secondary | ICD-10-CM

## 2023-07-06 DIAGNOSIS — M5030 Other cervical disc degeneration, unspecified cervical region: Secondary | ICD-10-CM

## 2023-07-06 DIAGNOSIS — M51369 Other intervertebral disc degeneration, lumbar region without mention of lumbar back pain or lower extremity pain: Secondary | ICD-10-CM

## 2023-07-06 HISTORY — DX: Other specified disorders of bone density and structure, unspecified site: M85.80

## 2023-07-06 HISTORY — DX: Other intervertebral disc degeneration, lumbar region without mention of lumbar back pain or lower extremity pain: M51.369

## 2023-07-06 HISTORY — DX: Other cervical disc degeneration, unspecified cervical region: M50.30

## 2023-07-06 NOTE — Patient Instructions (Signed)
 [name] , Thank you for taking time to come for your Medicare Wellness Visit. I appreciate your ongoing commitment to your health goals. Please review the following plan we discussed and let me know if I can assist you in the future.     This is a list of the screening recommended for you and due dates:  Health Maintenance  Topic Date Due   COVID-19 Vaccine (5 - 2024-25 season) 02/01/2023   HIV Screening  09/29/2023*   Medicare Annual Wellness Visit  07/05/2024   Colon Cancer Screening  10/30/2029   DTaP/Tdap/Td vaccine (2 - Td or Tdap) 02/07/2031   Pneumococcal Vaccination  Completed   Flu Shot  Completed   HPV Vaccine  Aged Out   Hepatitis C Screening  Discontinued  *Topic was postponed. The date shown is not the original due date.     Next appointment: Follow up in one year for your annual wellness visit.   Preventive Care 40-64 Years, Female Preventive care refers to lifestyle choices and visits with your health care provider that can promote health and wellness. What does preventive care include? A yearly physical exam. This is also called an annual well check. Dental exams once or twice a year. Routine eye exams. Ask your health care provider how often you should have your eyes checked. Personal lifestyle choices, including: Daily care of your teeth and gums. Regular physical activity. Eating a healthy diet. Avoiding tobacco and drug use. Limiting alcohol use. Practicing safe sex. Taking low-dose aspirin daily starting at age 50. Taking vitamin and mineral supplements as recommended by your health care provider. What happens during an annual well check? The services and screenings done by your health care provider during your annual well check will depend on your age, overall health, lifestyle risk factors, and family history of disease. Counseling  Your health care provider may ask you questions about your: Alcohol use. Tobacco use. Drug use. Emotional  well-being. Home and relationship well-being. Sexual activity. Eating habits. Work and work Astronomer. Method of birth control. Menstrual cycle. Pregnancy history. Screening  You may have the following tests or measurements: Height, weight, and BMI. Blood pressure. Lipid and cholesterol levels. These may be checked every 5 years, or more frequently if you are over 51 years old. Skin check. Lung cancer screening. You may have this screening every year starting at age 57 if you have a 30-pack-year history of smoking and currently smoke or have quit within the past 15 years. Fecal occult blood test (FOBT) of the stool. You may have this test every year starting at age 53. Flexible sigmoidoscopy or colonoscopy. You may have a sigmoidoscopy every 5 years or a colonoscopy every 10 years starting at age 79. Hepatitis C blood test. Hepatitis B blood test. Sexually transmitted disease (STD) testing. Diabetes screening. This is done by checking your blood sugar (glucose) after you have not eaten for a while (fasting). You may have this done every 1-3 years. Mammogram. This may be done every 1-2 years. Talk to your health care provider about when you should start having regular mammograms. This may depend on whether you have a family history of breast cancer. BRCA-related cancer screening. This may be done if you have a family history of breast, ovarian, tubal, or peritoneal cancers. Pelvic exam and Pap test. This may be done every 3 years starting at age 51. Starting at age 75, this may be done every 5 years if you have a Pap test in combination with  an HPV test. Bone density scan. This is done to screen for osteoporosis. You may have this scan if you are at high risk for osteoporosis. Discuss your test results, treatment options, and if necessary, the need for more tests with your health care provider. Vaccines  Your health care provider may recommend certain vaccines, such as: Influenza  vaccine. This is recommended every year. Tetanus, diphtheria, and acellular pertussis (Tdap, Td) vaccine. You may need a Td booster every 10 years. Zoster vaccine. You may need this after age 71. Pneumococcal 13-valent conjugate (PCV13) vaccine. You may need this if you have certain conditions and were not previously vaccinated. Pneumococcal polysaccharide (PPSV23) vaccine. You may need one or two doses if you smoke cigarettes or if you have certain conditions. Talk to your health care provider about which screenings and vaccines you need and how often you need them. This information is not intended to replace advice given to you by your health care provider. Make sure you discuss any questions you have with your health care provider. Document Released: 04/20/2015 Document Revised: 12/12/2015 Document Reviewed: 01/23/2015 Elsevier Interactive Patient Education  2017 [employer].    Fall Prevention in the Home Falls can cause injuries. They can happen to people of all ages. There are many things you can do to make your home safe and to help prevent falls. What can I do on the outside of my home? Regularly fix the edges of walkways and driveways and fix any cracks. Remove anything that might make you trip as you walk through a door, such as a raised step or threshold. Trim any bushes or trees on the path to your home. Use bright outdoor lighting. Clear any walking paths of anything that might make someone trip, such as rocks or tools. Regularly check to see if handrails are loose or broken. Make sure that both sides of any steps have handrails. Any raised decks and porches should have guardrails on the edges. Have any leaves, snow, or ice cleared regularly. Use sand or salt on walking paths during winter. Clean up any spills in your garage right away. This includes oil or grease spills. What can I do in the bathroom? Use night lights. Install grab bars by the toilet and in the tub and  shower. Do not use towel bars as grab bars. Use non-skid mats or decals in the tub or shower. If you need to sit down in the shower, use a plastic, non-slip stool. Keep the floor dry. Clean up any water that spills on the floor as soon as it happens. Remove soap buildup in the tub or shower regularly. Attach bath mats securely with double-sided non-slip rug tape. Do not have throw rugs and other things on the floor that can make you trip. What can I do in the bedroom? Use night lights. Make sure that you have a light by your bed that is easy to reach. Do not use any sheets or blankets that are too big for your bed. They should not hang down onto the floor. Have a firm chair that has side arms. You can use this for support while you get dressed. Do not have throw rugs and other things on the floor that can make you trip. What can I do in the kitchen? Clean up any spills right away. Avoid walking on wet floors. Keep items that you use a lot in easy-to-reach places. If you need to reach something above you, use a strong step stool that has  a grab bar. Keep electrical cords out of the way. Do not use floor polish or wax that makes floors slippery. If you must use wax, use non-skid floor wax. Do not have throw rugs and other things on the floor that can make you trip. What can I do with my stairs? Do not leave any items on the stairs. Make sure that there are handrails on both sides of the stairs and use them. Fix handrails that are broken or loose. Make sure that handrails are as long as the stairways. Check any carpeting to make sure that it is firmly attached to the stairs. Fix any carpet that is loose or worn. Avoid having throw rugs at the top or bottom of the stairs. If you do have throw rugs, attach them to the floor with carpet tape. Make sure that you have a light switch at the top of the stairs and the bottom of the stairs. If you do not have them, ask someone to add them for  you. What else can I do to help prevent falls? Wear shoes that: Do not have high heels. Have rubber bottoms. Are comfortable and fit you well. Are closed at the toe. Do not wear sandals. If you use a stepladder: Make sure that it is fully opened. Do not climb a closed stepladder. Make sure that both sides of the stepladder are locked into place. Ask someone to hold it for you, if possible. Clearly mark and make sure that you can see: Any grab bars or handrails. First and last steps. Where the edge of each step is. Use tools that help you move around (mobility aids) if they are needed. These include: Canes. Walkers. Scooters. Crutches. Turn on the lights when you go into a dark area. Replace any light bulbs as soon as they burn out. Set up your furniture so you have a clear path. Avoid moving your furniture around. If any of your floors are uneven, fix them. If there are any pets around you, be aware of where they are. Review your medicines with your doctor. Some medicines can make you feel dizzy. This can increase your chance of falling. Ask your doctor what other things that you can do to help prevent falls. This information is not intended to replace advice given to you by your health care provider. Make sure you discuss any questions you have with your health care provider. Document Released: 01/18/2009 Document Revised: 08/30/2015 Document Reviewed: 04/28/2014 Elsevier Interactive Patient Education  2017 [employer].

## 2023-07-06 NOTE — Progress Notes (Signed)
 Subjective:   Renee Brady is a 48 y.o. female who presents for Medicare Annual (Subsequent) preventive examination.  Visit Complete: Virtual I connected with  Renee Brady on 07/06/23 by a audio enabled telemedicine application and verified that I am speaking with the correct person using two identifiers.  Patient Location: Home  Provider Location: Office/Clinic  I discussed the limitations of evaluation and management by telemedicine. The patient expressed understanding and agreed to proceed.  Vital Signs: Because this visit was a virtual/telehealth visit, some criteria may be missing or patient reported. Any vitals not documented were not able to be obtained and vitals that have been documented are patient reported.  Cardiac Risk Factors include: advanced age (>20men, >53 women);dyslipidemia;hypertension;obesity (BMI >30kg/m2)     Objective:    Today's Vitals   07/06/23 1423  PainSc: 8    There is no height or weight on file to calculate BMI.     07/06/2023    2:23 PM 02/05/2023    6:25 AM 01/29/2023    8:28 AM 06/08/2020    9:02 AM 11/29/2014   10:38 AM  Advanced Directives  Does Patient Have a Medical Advance Directive? No No No No No  Would patient like information on creating a medical advance directive? No - Patient declined No - Patient declined Yes (MAU/Ambulatory/Procedural Areas - Information given) Yes (ED - Information included in AVS) No - patient declined information    Current Medications (verified) Outpatient Encounter Medications as of 07/06/2023  Medication Sig   albuterol (VENTOLIN HFA) 108 (90 Base) MCG/ACT inhaler Inhale 2 puffs into the lungs every 6 (six) hours as needed for wheezing or shortness of breath.   ALPRAZolam (XANAX) 0.5 MG tablet Take 0.5 mg by mouth daily as needed for anxiety.   amitriptyline (ELAVIL) 50 MG tablet TAKE 1 TABLET BY MOUTH EVERYDAY AT BEDTIME   aspirin EC 81 MG tablet Take 1 tablet (81 mg total) by mouth daily. Swallow  whole.   atorvastatin (LIPITOR) 20 MG tablet Take 1 tablet (20 mg total) by mouth daily.   azelastine (ASTELIN) 0.1 % nasal spray Place 1-2 sprays into both nostrils 2 (two) times daily as needed. (Patient taking differently: Place 1-2 sprays into both nostrils in the morning.)   Cholecalciferol (VITAMIN D3) 25 MCG (1000 UT) CAPS Take 1,000 Units by mouth in the morning.   cromolyn (OPTICROM) 4 % ophthalmic solution Place 1 drop into both eyes 4 (four) times daily. (Patient taking differently: Place 1 drop into both eyes daily.)   fluticasone furoate-vilanterol (BREO ELLIPTA) 100-25 MCG/ACT AEPB Inhale 1 puff into the lungs daily. (Patient not taking: Reported on 07/06/2023)   HYDROcodone-acetaminophen (NORCO/VICODIN) 5-325 MG tablet Take 1 tablet by mouth every 6 (six) hours as needed for severe pain (pain score 7-10).   hydrOXYzine (ATARAX) 25 MG tablet TAKE 1 TABLET BY MOUTH 3 TIMES DAILY AS NEEDED FOR ITCHING.   ipratropium-albuterol (DUONEB) 0.5-2.5 (3) MG/3ML SOLN Take 3 mLs by nebulization every 6 (six) hours as needed (wheezing).   isosorbide mononitrate (IMDUR) 30 MG 24 hr tablet TAKE 1 TABLET BY MOUTH EVERY DAY   loratadine (CLARITIN) 10 MG tablet Take 1 tablet (10 mg total) by mouth daily.   methocarbamol (ROBAXIN) 500 MG tablet Take 1 tablet (500 mg total) by mouth every 6 (six) hours as needed for muscle spasms.   metoprolol succinate (TOPROL-XL) 50 MG 24 hr tablet Take 1 tablet (50 mg total) by mouth daily.   metoprolol tartrate (LOPRESSOR) 100 MG tablet  Take 1 tablet (100 mg total) by mouth once for 1 dose. Take 2 hours prior to your CT if your heart rate is greater than 55   montelukast (SINGULAIR) 10 MG tablet TAKE 1 TABLET BY MOUTH EVERYDAY AT BEDTIME   NIFEdipine (PROCARDIA-XL/NIFEDICAL-XL) 30 MG 24 hr tablet Take 1 tablet (30 mg total) by mouth daily.   nitroGLYCERIN (NITROSTAT) 0.4 MG SL tablet Place 1 tablet (0.4 mg total) under the tongue every 5 (five) minutes as needed for  chest pain.   omega-3 acid ethyl esters (LOVAZA) 1 g capsule Take 1 g by mouth daily.   ondansetron (ZOFRAN) 4 MG tablet Take 1 tablet (4 mg total) by mouth daily as needed for nausea or vomiting.   pantoprazole (PROTONIX) 40 MG tablet Take 1 tablet (40 mg total) by mouth daily.   topiramate (TOPAMAX) 50 MG tablet Take 1 tablet (50 mg total) by mouth daily.   triamcinolone cream (KENALOG) 0.1 % Apply 1 Application topically 2 (two) times daily. (Patient taking differently: Apply 1 Application topically 2 (two) times daily as needed (skin irritation/rash.).)   zolpidem (AMBIEN) 5 MG tablet Take 5 mg by mouth at bedtime.   No facility-administered encounter medications on file as of 07/06/2023.    Allergies (verified) Inderal [propranolol], Lactose, Lactose intolerance (gi), Latex, Mobic [meloxicam], Neurontin [gabapentin], Oxycodone hcl, Percocet [oxycodone-acetaminophen], Prozac [fluoxetine hcl], Serotonin, Serotonin reuptake inhibitors (ssris), Tecfidera [dimethyl fumarate], Tramadol, Trospium chloride, Wixela inhub [fluticasone-salmeterol], Zoloft [sertraline], Buspirone hcl, Hydrochlorothiazide, and Penicillins   History: Past Medical History:  Diagnosis Date   ADHD    Allergic conjunctivitis    Allergic rhinitis 09/29/2022   ANA positive 06/26/2014   Formatting of this note might be different from the original. Overview:  Evaluated by Rheumatology Dr. Herma Carson - dx fibromyalgia given.   Angina pectoris (HCC) 05/25/2020   Ankle pain, left 08/27/2014   Formatting of this note might be different from the original. Overview:  Fell on this day with injury to Lt foot and ankle.   Anxiety and depression 04/03/2015   Asthma    Bipolar 2 disorder, major depressive episode (HCC)    suspected due to being on SSRIs and causing irritability   CAD (coronary artery disease) 07/03/2020   Cervical radiculitis 10/23/2016   Chronic allergic conjunctivitis    Chronic pain of left knee 09/18/2014    Formatting of this note might be different from the original. Overview:  Evaluated by Ortho.   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 PT, Life-style modification, and NSAID PRN.   Prediabetes 10/18/2021   PTSD (post-traumatic stress disorder)    Raynaud's disease 06/05/2014   RBC microcytosis    Rosai-Dorfman disease (HCC)    Rosai-Dorfman disease (HCC)    11/24   Sensitivity to medication 04/24/2015    Includes all SSRI's (Prozac, Paxil, Lexapro, Zoloft).    Also Wellbutrin, hydroxyzine, and gabapentin.   Reactions are paradoxical in nature with hyper-stimulatory response,   mood changes, and panic attacks.   Amitriptyline has  been tolerated well (2016).     Spinal stenosis in cervical region 10/23/2016   Subcutaneous nodule of chest wall 04/14/2022   Trochanteric bursitis of right hip 07/13/2014   Formatting of this note might be different from the original. Evaluated by Ortho.  Tx with CSI and PT.   Urinary urgency 01/29/2018   Vision abnormalities    Vitamin D deficiency 07/28/2014   Formatting of this note might be different from the original. Daily supplement.   Past Surgical History:  Procedure Laterality Date   ABDOMINAL HYSTERECTOMY     uterus and cervix removed but still has ovaries   CESAREAN SECTION  x 2   COLONOSCOPY  12/28/2009   Mild sigmoid diverticulosis. Small internal hemorrhoids.   ESOPHAGOGASTRODUODENOSCOPY  12/28/2009   Mild gastroduodenitis. Otherwise normal EGD   LAPAROSCOPIC REMOVAL ABDOMINAL MASS Right 02/05/2023   Procedure: LAPAROSCOPIC REMOVAL RIGHT RETROPERIONEAL MASS;  Surgeon: Fritzi Mandes, MD;  Location: [facility] OR;  Service: General;  Laterality: Right;   LEFT HEART CATH AND CORONARY ANGIOGRAPHY N/A 06/08/2020   Procedure: LEFT HEART CATH AND CORONARY ANGIOGRAPHY;  Surgeon: Kathleene Hazel, MD;  Location: [facility] IN[facility]AS[facility];  Service: Cardiovascular;  Laterality: N/A;   lumbar laminectomy Left 11/02/2016   TONSILLECTOMY     WISDOM TOOTH EXTRACTION     Family History  Problem Relation Age of Onset   Asthma Mother    Stroke Mother    Cardiomyopathy Mother    Hypertension Mother    Diabetes Mother    Hypertension Father    ADD / ADHD Father    Depression Father    Kidney disease Father        Stage 3   Diabetes Brother    Bone cancer Maternal Grandmother    Heart attack Paternal Grandmother    CAD Paternal Grandfather    Prostate cancer Paternal Grandfather    Allergic rhinitis Daughter    Asthma Daughter    ADD / ADHD Daughter    Eczema Son    Allergic rhinitis Son    Asthma Son    Esophageal cancer Maternal Uncle    Eczema Granddaughter    Colon  cancer Neg Hx    Rectal cancer Neg Hx    Stomach cancer Neg Hx    Immunodeficiency Neg Hx    Social History   Socioeconomic History   Marital status: Married    Spouse name: Not on file   Number of children: 2   Years of education: Not on file   Highest education level: Associate degree: academic program  Occupational History   Not on file  Tobacco Use   Smoking status: Former  Current packs/day: 0.00    Types: Cigarettes    Quit date: 2005    Years since quitting: 20.2   Smokeless tobacco: Never  Vaping Use   Vaping status: Never Used  Substance and Sexual Activity   Alcohol use: No   Drug use: Yes    Types: Marijuana    Comment: marijuana use daily   Sexual activity: Yes    Birth control/protection: Surgical  Other Topics Concern   Not on file  Social History Narrative   One son, one daughter   Associates degree in business   One son in the army and one daughter   2 grand daughters   Married   Enjoys, reading, walking, stationary bike   Social Drivers of Health   Financial Resource Strain: Low Risk  (07/06/2023)   Overall Financial Resource Strain (CARDIA)    Difficulty of Paying Living Expenses: Not hard at all  Food Insecurity: No Food Insecurity (07/06/2023)   Hunger Vital Sign    Worried About Running Out of Food in the Last Year: Never true    Ran Out of Food in the Last Year: Never true  Transportation Needs: No Transportation Needs (07/06/2023)   PRAPARE - Administrator, Civil Service (Medical): No    Lack of Transportation (Non-Medical): No  Physical Activity: Inactive (07/06/2023)   Exercise Vital Sign    Days of Exercise per Week: 0 days    Minutes of Exercise per Session: 0 min  Stress: Stress Concern Present (07/06/2023)   [employer] of Occupational Health - Occupational Stress Questionnaire    Feeling of Stress : Very much  Social Connections: Moderately Isolated (07/06/2023)   Social Connection and Isolation Panel [NHANES]     Frequency of Communication with Friends and Family: Once a week    Frequency of Social Gatherings with Friends and Family: More than three times a week    Attends Religious Services: Never    Database administrator or Organizations: No    Attends Engineer, structural: Never    Marital Status: Married    Tobacco Counseling Counseling given: Not Answered   Clinical Intake:  Pre-visit preparation completed: Yes  Pain : 0-10 Pain Score: 8  Pain Type: Chronic pain Pain Location: Neck Pain Onset: More than a month ago Pain Frequency: Constant  BMI - recorded: 37.94 Nutritional Status: BMI > 30  Obese Nutritional Risks: None Diabetes: No  How often do you need to have someone help you when you read instructions, pamphlets, or other written materials from your doctor or pharmacy?: 1 - Never  Interpreter Needed?: No  Information entered by :: Donne Anon, CMA   Activities of Daily Living    07/06/2023    2:26 PM 01/29/2023    8:35 AM  In your present state of health, do you have any difficulty performing the following activities:  Hearing? 0   Comment has ringing in ears   Vision? 1   Difficulty concentrating or making decisions? 1   Comment "brain fog"   Walking or climbing stairs? 1   Dressing or bathing? 0   Doing errands, shopping? 0 0  Preparing Food and eating ? N   Using the Toilet? N   In the past six months, have you accidently leaked urine? Y   Do you have problems with loss of bowel control? Y   Managing your Medications? N   Managing your Finances? N   Housekeeping or managing your Housekeeping? N  Patient Care Team: [name], NP as PCP - General (Internal Medicine) Epimenio Foot, Pearletha Furl, MD (Neurology) Revankar, Aundra Dubin, MD as Consulting Physician (Cardiology) Carlis Abbott Drema Pry, MD as Consulting Physician (Physical Medicine and Rehabilitation)  Indicate any recent Medical Services you may have received from other than [facility]  providers in the past year (date may be approximate).     Assessment:   This is a routine wellness examination for Renee Brady.  Hearing/Vision screen No results found.   Goals Addressed   None    Depression Screen    07/06/2023    2:37 PM 07/01/2022   10:15 AM 10/15/2021    7:58 AM 05/14/2021   10:22 AM 10/03/2020   11:50 AM  PHQ 2/9 Scores  PHQ - 2 Score 3 2 1 3  0  PHQ- 9 Score  8 9 11 7     Fall Risk    07/06/2023    2:28 PM 09/08/2022   10:16 AM 07/01/2022   10:13 AM 07/03/2021    3:28 PM 05/14/2021   10:18 AM  Fall Risk   Falls in the past year? 1 1 1 1  0  Comment   she had two falls, leg was weak. She is doing PT now    Number falls in past yr: 1 1  0 0  Comment  leg injury     Injury with Fall? 1  1 0   Risk for fall due to : History of fall(s)  Impaired balance/gait Impaired balance/gait;History of fall(s);Other (Comment)   Risk for fall due to: Comment    pain   Follow up Falls evaluation completed  Falls prevention discussed Falls evaluation completed;Education provided     MEDICARE RISK AT HOME: Medicare Risk at Home Any stairs in or around the home?: Yes If so, are there any without handrails?: No Home free of loose throw rugs in walkways, pet beds, electrical cords, etc?: Yes Adequate lighting in your home to reduce risk of falls?: Yes Life alert?: No Use of a cane, walker or w/c?: Yes Grab bars in the bathroom?: Yes Shower chair or bench in shower?: Yes Elevated toilet seat or a handicapped toilet?: No  TIMED UP AND GO:  Was the test performed?  No    Cognitive Function:        07/06/2023    2:39 PM  6CIT Screen  What Year? 0 points  What month? 0 points  What time? 0 points  Count back from 20 0 points  Months in reverse 0 points  Repeat phrase 4 points  Total Score 4 points    Immunizations Immunization History  Administered Date(s) Administered   Covid-19 Iv Non-us Vaccine (Bibp, Sinopharm) 01/05/2022, 12/07/2022   Influenza,inj,Quad PF,6+  Mos 01/06/2022   Influenza-Unspecified 02/06/2021, 11/06/2022, 11/14/2022   Moderna Covid-19 Vaccine Bivalent Booster 40yrs & up 03/20/2021   Moderna Sars-Covid-2 Vaccination 07/12/2019, 08/09/2019, 03/13/2020   PNEUMOCOCCAL CONJUGATE-20 02/06/2021   Pneumococcal Conjugate,unspecified 01/05/2022   Tdap 02/06/2021    TDAP status: Up to date  Flu Vaccine status: Up to date  Pneumococcal vaccine status: Up to date  Covid-19 vaccine status: Information provided on how to obtain vaccines.   Qualifies for Shingles Vaccine? No     Screening Tests Health Maintenance  Topic Date Due   COVID-19 Vaccine (5 - 2024-25 season) 02/01/2023   Medicare Annual Wellness (AWV)  07/01/2023   HIV Screening  09/29/2023 (Originally 06/04/1990)   Colonoscopy  10/30/2029   DTaP/Tdap/Td (2 - Td or Tdap) 02/07/2031  Pneumococcal Vaccine 50-11 Years old  Completed   INFLUENZA VACCINE  Completed   HPV VACCINES  Aged Out   Hepatitis C Screening  Discontinued    Health Maintenance  Health Maintenance Due  Topic Date Due   COVID-19 Vaccine (5 - 2024-25 season) 02/01/2023   Medicare Annual Wellness (AWV)  07/01/2023    Colorectal cancer screening: Type of screening: Colonoscopy. Completed 10/31/22. Repeat every 7 years  Mammogram status: Completed 03/16/23. Repeat every year  Lung Cancer Screening: (Low Dose CT Chest recommended if Age 66-80 years, 20 pack-year currently smoking OR have quit w/in 15years.) does not qualify.   Additional Screening:  Hepatitis C Screening: does qualify; Completed N/a  Vision Screening: Recommended annual ophthalmology exams for early detection of glaucoma and other disorders of the eye. Is the patient up to date with their annual eye exam?  Yes  Who is the provider or what is the name of the office in which the patient attends annual eye exams? [facility] If pt is not established with a provider, would they like to be referred to a provider to establish care? No .    Dental Screening: Recommended annual dental exams for proper oral hygiene  Diabetic Foot Exam: N/a  Community Resource Referral / Chronic Care Management: CRR required this visit?  No   CCM required this visit?  No     Plan:     I have personally reviewed and noted the following in the patient's chart:   Medical and social history Use of alcohol, tobacco or illicit drugs  Current medications and supplements including opioid prescriptions. Patient is currently taking opioid prescriptions. Information provided to patient regarding non-opioid alternatives. Patient advised to discuss non-opioid treatment plan with their provider. Functional ability and status Nutritional status Physical activity Advanced directives List of other physicians Hospitalizations, surgeries, and ER visits in previous 12 months Vitals Screenings to include cognitive, depression, and falls Referrals and appointments  In addition, I have reviewed and discussed with patient certain preventive protocols, quality metrics, and best practice recommendations. A written personalized care plan for preventive services as well as general preventive health recommendations were provided to patient.     Donne Anon, CMA   07/06/2023   After Visit Summary: (MyChart) Due to this being a telephonic visit, the after visit summary with patients personalized plan was offered to patient via MyChart   Nurse Notes: None

## 2023-07-09 ENCOUNTER — Encounter: Payer: Self-pay | Admitting: Family

## 2023-07-13 ENCOUNTER — Ambulatory Visit (HOSPITAL_COMMUNITY)

## 2023-07-13 ENCOUNTER — Encounter (HOSPITAL_COMMUNITY): Payer: Self-pay

## 2023-07-13 ENCOUNTER — Encounter: Payer: Self-pay | Admitting: Internal Medicine

## 2023-07-14 ENCOUNTER — Ambulatory Visit: Admitting: Internal Medicine

## 2023-07-15 ENCOUNTER — Other Ambulatory Visit: Payer: Self-pay | Admitting: Cardiovascular Disease

## 2023-07-15 ENCOUNTER — Ambulatory Visit (HOSPITAL_COMMUNITY)
Admission: RE | Admit: 2023-07-15 | Discharge: 2023-07-15 | Disposition: A | Discharge status: Home / Self Care | Source: Ambulatory Visit | Attending: Cardiology | Admitting: Cardiology

## 2023-07-15 ENCOUNTER — Ambulatory Visit (HOSPITAL_BASED_OUTPATIENT_CLINIC_OR_DEPARTMENT_OTHER)
Admission: RE | Admit: 2023-07-15 | Discharge: 2023-07-15 | Disposition: A | Discharge status: Home / Self Care | Source: Ambulatory Visit | Attending: Cardiovascular Disease | Admitting: Cardiovascular Disease

## 2023-07-15 DIAGNOSIS — I259 Chronic ischemic heart disease, unspecified: Secondary | ICD-10-CM | POA: Diagnosis present

## 2023-07-15 DIAGNOSIS — R931 Abnormal findings on diagnostic imaging of heart and coronary circulation: Secondary | ICD-10-CM | POA: Insufficient documentation

## 2023-07-15 DIAGNOSIS — I2510 Atherosclerotic heart disease of native coronary artery without angina pectoris: Secondary | ICD-10-CM | POA: Diagnosis present

## 2023-07-15 DIAGNOSIS — I209 Angina pectoris, unspecified: Secondary | ICD-10-CM | POA: Diagnosis present

## 2023-07-15 DIAGNOSIS — I25119 Atherosclerotic heart disease of native coronary artery with unspecified angina pectoris: Secondary | ICD-10-CM | POA: Insufficient documentation

## 2023-07-15 MED ORDER — NITROGLYCERIN 0.4 MG SL SUBL
0.8000 mg | SUBLINGUAL_TABLET | Freq: Once | SUBLINGUAL | Status: AC
  Administered 2023-07-15: 0.8 mg via SUBLINGUAL

## 2023-07-15 MED ORDER — IOHEXOL 350 MG/ML SOLN
95.0000 mL | Freq: Once | INTRAVENOUS | Status: AC | PRN
  Administered 2023-07-15: 95 mL via INTRAVENOUS

## 2023-07-15 MED ORDER — NITROGLYCERIN 0.4 MG SL SUBL
SUBLINGUAL_TABLET | SUBLINGUAL | Status: AC
  Filled 2023-07-15: qty 2

## 2023-07-16 ENCOUNTER — Telehealth: Payer: Self-pay

## 2023-07-16 ENCOUNTER — Ambulatory Visit: Admitting: Internal Medicine

## 2023-07-16 DIAGNOSIS — R002 Palpitations: Secondary | ICD-10-CM

## 2023-07-16 DIAGNOSIS — E782 Mixed hyperlipidemia: Secondary | ICD-10-CM

## 2023-07-16 DIAGNOSIS — I2510 Atherosclerotic heart disease of native coronary artery without angina pectoris: Secondary | ICD-10-CM

## 2023-07-16 NOTE — Telephone Encounter (Signed)
-----   Message from Aundra Dubin Revankar sent at 07/16/2023 10:02 AM EDT ----- Awaiting CT FFR results.  Meanwhile aspirin and nitroglycerin.  Bring patient in for Chem-7 liver lipid check.  Copy primary Garwin Brothers, MD 07/16/2023 10:02 AM

## 2023-07-16 NOTE — Telephone Encounter (Signed)
 MyChart message

## 2023-07-25 LAB — LIPID PANEL
Chol/HDL Ratio: 2.4 ratio (ref 0.0–4.4)
Cholesterol, Total: 117 mg/dL (ref 100–199)
HDL: 48 mg/dL (ref >39)
LDL Chol Calc (NIH): 54 mg/dL (ref 0–99)
Triglycerides: 70 mg/dL (ref 0–149)
VLDL Cholesterol Cal: 15 mg/dL (ref 5–40)

## 2023-07-25 LAB — COMPREHENSIVE METABOLIC PANEL WITH GFR
ALT: 14 IU/L (ref 0–32)
AST: 15 IU/L (ref 0–40)
Albumin: 4.1 g/dL (ref 3.9–4.9)
Alkaline Phosphatase: 80 IU/L (ref 44–121)
BUN/Creatinine Ratio: 11 (ref 9–23)
BUN: 12 mg/dL (ref 6–24)
Bilirubin Total: 0.5 mg/dL (ref 0.0–1.2)
CO2: 21 mmol/L (ref 20–29)
Calcium: 9.3 mg/dL (ref 8.7–10.2)
Chloride: 102 mmol/L (ref 96–106)
Creatinine, Ser: 1.11 mg/dL — ABNORMAL HIGH (ref 0.57–1.00)
Globulin, Total: 2.6 g/dL (ref 1.5–4.5)
Glucose: 139 mg/dL — ABNORMAL HIGH (ref 70–99)
Potassium: 4.8 mmol/L (ref 3.5–5.2)
Sodium: 137 mmol/L (ref 134–144)
Total Protein: 6.7 g/dL (ref 6.0–8.5)
eGFR: 61 mL/min/{1.73_m2} (ref >59)

## 2023-07-28 ENCOUNTER — Ambulatory Visit (INDEPENDENT_AMBULATORY_CARE_PROVIDER_SITE_OTHER): Payer: Medicare Other | Admitting: Family

## 2023-07-28 ENCOUNTER — Encounter: Payer: Self-pay | Admitting: Family

## 2023-07-28 VITALS — BP 113/79 | HR 98 | Temp 98.7°F | Resp 16 | Ht 65.0 in | Wt 224.0 lb

## 2023-07-28 DIAGNOSIS — M25551 Pain in right hip: Secondary | ICD-10-CM

## 2023-07-28 DIAGNOSIS — I10 Essential (primary) hypertension: Secondary | ICD-10-CM | POA: Diagnosis not present

## 2023-07-28 DIAGNOSIS — R3129 Other microscopic hematuria: Secondary | ICD-10-CM

## 2023-07-28 DIAGNOSIS — M797 Fibromyalgia: Secondary | ICD-10-CM | POA: Diagnosis not present

## 2023-07-28 DIAGNOSIS — J4540 Moderate persistent asthma, uncomplicated: Secondary | ICD-10-CM

## 2023-07-28 DIAGNOSIS — I25118 Atherosclerotic heart disease of native coronary artery with other forms of angina pectoris: Secondary | ICD-10-CM

## 2023-07-28 DIAGNOSIS — R7303 Prediabetes: Secondary | ICD-10-CM | POA: Diagnosis not present

## 2023-07-28 DIAGNOSIS — F3181 Bipolar II disorder: Secondary | ICD-10-CM

## 2023-07-28 DIAGNOSIS — G894 Chronic pain syndrome: Secondary | ICD-10-CM

## 2023-07-28 DIAGNOSIS — M5412 Radiculopathy, cervical region: Secondary | ICD-10-CM

## 2023-07-28 HISTORY — DX: Pain in right hip: M25.551

## 2023-07-28 HISTORY — DX: Other microscopic hematuria: R31.29

## 2023-07-28 LAB — URINALYSIS, ROUTINE W REFLEX MICROSCOPIC
Bilirubin Urine: NEGATIVE
Hgb urine dipstick: NEGATIVE
Ketones, ur: NEGATIVE
Leukocytes,Ua: NEGATIVE
Nitrite: NEGATIVE
RBC / HPF: NONE SEEN (ref 0–?)
Specific Gravity, Urine: 1.025 (ref 1.000–1.030)
Total Protein, Urine: NEGATIVE
Urine Glucose: NEGATIVE
Urobilinogen, UA: 0.2 (ref 0.0–1.0)
pH: 6 (ref 5.0–8.0)

## 2023-07-28 LAB — HEMOGLOBIN A1C: Hgb A1c MFr Bld: 6.3 % (ref 4.6–6.5)

## 2023-07-28 NOTE — Progress Notes (Signed)
 kk  Subjective:     Patient ID: Renee Brady, female    DOB: 1975/12/29, 48 y.o.   MRN: [id number]  Chief Complaint  Patient presents with   Post-Traumatic Stress Disorder    Here for follow up   Hip Pain    Patient reports right hip pain after "twisting hip last week". Has appointment with ortho 08/20/23    Hip Pain     Discussed the use of AI scribe software for clinical note transcription with the patient, who gave verbal consent to proceed.  History of Present Illness  Jaelyn Brady is a 48 year old female with fibromyalgia and chronic pain who presents with hip pain after a recent injury.  She has been experiencing hip pain for the past week following an incident where she was startled by her granddaughter entering the bathroom, causing a sudden movement. The pain has been difficult to manage despite taking amitriptyline , topiramate , and methocarbamol . Tylenol  does not fully alleviate her symptoms.  She has a history of fibromyalgia and chronic joint pain affecting her fingers, neck, back, hips, knees, and ankles. Her joint pain has been worsening, particularly in her fingers, and she experiences persistent severe pain in her neck. Previous MRIs have shown degenerative changes in her cervical spine. She previously took diclofenac  for arthritis pain, which was effective, but discontinued it due to stomach issues. She finds Tylenol  insufficient for pain relief and describes the residual pain as 'icky' and difficult to live with.  She reports a recent episode of severe nausea lasting about a month, which resolved before her granddaughter's visit. She also has concerns about her kidney function, noting a history of cloudy urine and blood in her urine, and desires further evaluation.  She has asthma and allergies, managed with Singulair  daily. She stopped taking Breo due to side effects, including chest pain and increased anxiety, and uses an albuterol  inhaler occasionally.  She is  following with behavioral health services and has a history of anxiety and bipolar disorder. She takes topiramate  daily for mental health support and is cautious about adding new medications due to past allergic reactions and side effects.  Lab Results  Component Value Date   HGBA1C 6.3 07/28/2023       Health Maintenance Due  Topic Date Due   COVID-19 Vaccine (5 - 2024-25 season) 02/01/2023    Past Medical History:  Diagnosis Date   ADHD    Allergic conjunctivitis    Allergic rhinitis 09/29/2022   ANA positive 06/26/2014   Formatting of this note might be different from the original. Overview:  Evaluated by Rheumatology Dr. Lindle Rhea - dx fibromyalgia given.   Angina pectoris (HCC) 05/25/2020   Ankle pain, left 08/27/2014   Formatting of this note might be different from the original. Overview:  Fell on this day with injury to Lt foot and ankle.   Anxiety and depression 04/03/2015   Asthma    Bipolar 2 disorder, major depressive episode (HCC)    suspected due to being on SSRIs and causing irritability   CAD (coronary artery disease) 07/03/2020   Cervical radiculitis 10/23/2016   Chronic allergic conjunctivitis    Chronic pain of left knee 09/18/2014   Formatting of this note might be different from the original. Overview:  Evaluated by Ortho.   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 PT, Life-style modification, and NSAID PRN.   Prediabetes 10/18/2021   PTSD (post-traumatic stress disorder)    Raynaud's disease 06/05/2014   RBC microcytosis    Rosai-Dorfman disease (HCC)    Rosai-Dorfman disease (HCC)    11/24   Sensitivity to medication 04/24/2015    Includes all SSRI's (Prozac, Paxil, Lexapro, Zoloft).    Also Wellbutrin, hydroxyzine , and gabapentin.   Reactions are paradoxical in nature with hyper-stimulatory response,   mood changes, and panic attacks.   Amitriptyline  has been tolerated well (2016).     Spinal stenosis in cervical region 10/23/2016   Subcutaneous nodule of chest wall 04/14/2022   Trochanteric bursitis of right hip 07/13/2014   Formatting of this note might be different from the original. Evaluated by Ortho.  Tx with CSI and PT.   Urinary urgency 01/29/2018   Vision abnormalities    Vitamin D  deficiency 07/28/2014   Formatting of this note might be different from the original. Daily supplement.    Past Surgical History:  Procedure Laterality Date   ABDOMINAL HYSTERECTOMY     uterus and cervix removed but still has ovaries   CESAREAN SECTION  x 2   COLONOSCOPY  12/28/2009   Mild sigmoid diverticulosis. Small internal hemorrhoids.    ESOPHAGOGASTRODUODENOSCOPY  12/28/2009   Mild gastroduodenitis. Otherwise normal EGD   LAPAROSCOPIC REMOVAL ABDOMINAL MASS Right 02/05/2023   Procedure: LAPAROSCOPIC REMOVAL RIGHT RETROPERIONEAL MASS;  Surgeon: Lujean Sake, MD;  Location: [facility] OR;  Service: General;  Laterality: Right;   LEFT HEART CATH AND CORONARY ANGIOGRAPHY N/A 06/08/2020   Procedure: LEFT HEART CATH AND CORONARY ANGIOGRAPHY;  Surgeon: Odie Benne, MD;  Location: [facility] INVASIVE CV LAB;  Service: Cardiovascular;  Laterality: N/A;   lumbar laminectomy Left 11/02/2016   TONSILLECTOMY     WISDOM TOOTH EXTRACTION      Family History  Problem Relation Age of Onset   Asthma Mother    Stroke Mother    Cardiomyopathy Mother    Hypertension Mother    Diabetes Mother    Hypertension Father    ADD / ADHD Father    Depression Father    Kidney disease Father        Stage 3   Diabetes Brother    Bone cancer Maternal Grandmother    Heart  attack Paternal Grandmother    CAD Paternal Grandfather    Prostate cancer Paternal Grandfather    Allergic rhinitis Daughter    Asthma Daughter    ADD / ADHD Daughter    Eczema Son    Allergic rhinitis Son    Asthma Son    Esophageal cancer Maternal Uncle    Eczema Granddaughter    Colon cancer Neg Hx    Rectal cancer Neg Hx    Stomach cancer Neg Hx    Immunodeficiency Neg Hx     Social History   Socioeconomic History   Marital status: Married    Spouse name: Not on file   Number of children: 2   Years of education: Not on file   Highest education level: Associate degree: academic program  Occupational History   Not on file  Tobacco Use   Smoking status: Former    Current packs/day: 0.00    Types: Cigarettes    Quit date: 2005    Years since quitting: 20.3   Smokeless tobacco: Never  Vaping Use   Vaping status: Never Used  Substance and Sexual Activity   Alcohol use: No   Drug use: Yes    Types: Marijuana    Comment: marijuana use daily   Sexual  activity: Yes    Birth control/protection: Surgical  Other Topics Concern   Not on file  Social History Narrative   One son, one daughter   Associates degree in business   One son in the army and one daughter   2 grand daughters   Married   Enjoys, reading, walking, stationary bike   Social Drivers of Health   Financial Resource Strain: Low Risk  (07/06/2023)   Overall Financial Resource Strain (CARDIA)    Difficulty of Paying Living Expenses: Not hard at all  Food Insecurity: No Food Insecurity (07/06/2023)   Hunger Vital Sign    Worried About Running Out of Food in the Last Year: Never true    Ran Out of Food in the Last Year: Never true  Transportation Needs: No Transportation Needs (07/06/2023)   PRAPARE - Administrator, Civil Service (Medical): No    Lack of Transportation (Non-Medical): No  Physical Activity: Inactive (07/06/2023)   Exercise Vital Sign    Days of Exercise per Week: 0 days    Minutes of Exercise per Session: 0 min  Stress: Stress Concern Present (07/06/2023)   [employer] of Occupational Health - Occupational Stress Questionnaire    Feeling of Stress : Very much  Social Connections: Moderately Isolated (07/06/2023)   Social Connection and Isolation Panel [NHANES]    Frequency of Communication with Friends and Family: Once a week    Frequency of Social Gatherings with Friends and Family: More than three times a week    Attends Religious Services: Never    Database administrator or Organizations: No    Attends Banker Meetings: Never    Marital Status: Married  Catering manager Violence: Not At Risk (07/06/2023)   Humiliation, Afraid, Rape, and Kick questionnaire    Fear of Current or Ex-Partner: No    Emotionally Abused: No    Physically Abused: No    Sexually Abused: No    Outpatient Medications Prior to Visit  Medication Sig Dispense Refill   albuterol  (VENTOLIN  HFA) 108 (90 Base) MCG/ACT inhaler Inhale 2 puffs into the  lungs every 6 (six) hours as needed for wheezing or shortness of breath. 54 g 1  ALPRAZolam (XANAX) 0.5 MG tablet Take 0.5 mg by mouth daily as needed for anxiety.     amitriptyline  (ELAVIL ) 50 MG tablet TAKE 1 TABLET BY MOUTH EVERYDAY AT BEDTIME 90 tablet 1   aspirin  EC 81 MG tablet Take 1 tablet (81 mg total) by mouth daily. Swallow whole. 90 tablet 3   atorvastatin  (LIPITOR) 20 MG tablet Take 1 tablet (20 mg total) by mouth daily. 90 tablet 3   azelastine  (ASTELIN ) 0.1 % nasal spray Place 1-2 sprays into both nostrils 2 (two) times daily as needed. (Patient taking differently: Place 1-2 sprays into both nostrils in the morning.) 30 mL 5   Cholecalciferol (VITAMIN D3) 25 MCG (1000 UT) CAPS Take 1,000 Units by mouth in the morning.     cromolyn  (OPTICROM ) 4 % ophthalmic solution Place 1 drop into both eyes 4 (four) times daily. (Patient taking differently: Place 1 drop into both eyes daily.) 30 mL 1   hydrOXYzine  (ATARAX ) 25 MG tablet TAKE 1 TABLET BY MOUTH 3 TIMES DAILY AS NEEDED FOR ITCHING. 270 tablet 2   ipratropium-albuterol  (DUONEB) 0.5-2.5 (3) MG/3ML SOLN Take 3 mLs by nebulization every 6 (six) hours as needed (wheezing). 360 mL 1   isosorbide  mononitrate (IMDUR ) 30 MG 24 hr tablet TAKE 1 TABLET BY MOUTH EVERY DAY 90 tablet 2   loratadine  (CLARITIN ) 10 MG tablet Take 1 tablet (10 mg total) by mouth daily.     methocarbamol  (ROBAXIN ) 500 MG tablet Take 1 tablet (500 mg total) by mouth every 6 (six) hours as needed for muscle spasms. 360 tablet 0   metoprolol  succinate (TOPROL -XL) 50 MG 24 hr tablet Take 1 tablet (50 mg total) by mouth daily. 90 tablet 3   montelukast  (SINGULAIR ) 10 MG tablet TAKE 1 TABLET BY MOUTH EVERYDAY AT BEDTIME 90 tablet 1   NIFEdipine  (PROCARDIA -XL/NIFEDICAL-XL) 30 MG 24 hr tablet Take 1 tablet (30 mg total) by mouth daily. 90 tablet 1   nitroGLYCERIN  (NITROSTAT ) 0.4 MG SL tablet Place 1 tablet (0.4 mg total) under the tongue every 5 (five) minutes as needed for chest  pain. 25 tablet 3   omega-3 acid ethyl esters (LOVAZA) 1 g capsule Take 1 g by mouth daily.     ondansetron  (ZOFRAN ) 4 MG tablet Take 1 tablet (4 mg total) by mouth daily as needed for nausea or vomiting. 90 tablet 2   pantoprazole  (PROTONIX ) 40 MG tablet Take 1 tablet (40 mg total) by mouth daily. 90 tablet 3   topiramate  (TOPAMAX ) 50 MG tablet Take 1 tablet (50 mg total) by mouth daily. 90 tablet 3   triamcinolone  cream (KENALOG ) 0.1 % Apply 1 Application topically 2 (two) times daily. (Patient taking differently: Apply 1 Application topically 2 (two) times daily as needed (skin irritation/rash.).) 60 g 1   zolpidem (AMBIEN) 5 MG tablet Take 5 mg by mouth at bedtime.     metoprolol  tartrate (LOPRESSOR ) 100 MG tablet Take 1 tablet (100 mg total) by mouth once for 1 dose. Take 2 hours prior to your CT if your heart rate is greater than 55 1 tablet 0   HYDROcodone -acetaminophen  (NORCO/VICODIN) 5-325 MG tablet Take 1 tablet by mouth every 6 (six) hours as needed for severe pain (pain score 7-10). 12 tablet 0   No facility-administered medications prior to visit.    Allergies  Allergen Reactions   Breo Ellipta  [Fluticasone  Furoate-Vilanterol] Anxiety and Hypertension    Patient reported chest pain    Inderal [Propranolol]     palpitations  Lactose Other (See Comments)    Upset stomach   Lactose Intolerance (Gi) Other (See Comments)    Upset stomach   Latex Itching    Powder   Mobic [Meloxicam] Nausea Only   Neurontin [Gabapentin] Other (See Comments)    Mood changes   Oxycodone  Hcl Itching and Nausea Only   Percocet [Oxycodone -Acetaminophen ] Nausea Only   Prozac [Fluoxetine Hcl] Other (See Comments)    Mental Changes   Serotonin Other (See Comments)   Serotonin Reuptake Inhibitors (Ssris)    Tecfidera [Dimethyl Fumarate]     Body shut down, constipation (GI issues)   Tramadol Itching   Trospium  Chloride Itching and Swelling    Weight gain   Wixela Inhub [Fluticasone -Salmeterol]      Chest pain/ per patient   Zoloft [Sertraline] Other (See Comments)    Caused Heart Porblems   Buspirone  Hcl Palpitations   Hydrochlorothiazide Palpitations   Penicillins Itching and Rash    Reaction: In her 20's    ROS See HPI    Objective:    Physical Exam Constitutional:      General: She is not in acute distress.    Appearance: Normal appearance. She is well-developed.  HENT:     Head: Normocephalic and atraumatic.     Right Ear: External ear normal.     Left Ear: External ear normal.  Eyes:     General: No scleral icterus. Neck:     Thyroid: No thyromegaly.  Cardiovascular:     Rate and Rhythm: Normal rate and regular rhythm.     Heart sounds: Normal heart sounds. No murmur heard. Pulmonary:     Effort: Pulmonary effort is normal. No respiratory distress.     Breath sounds: Normal breath sounds. No wheezing.  Musculoskeletal:     Cervical back: Neck supple.  Skin:    General: Skin is warm and dry.  Neurological:     Mental Status: She is alert and oriented to person, place, and time.  Psychiatric:        Mood and Affect: Mood normal.        Speech: Speech is tangential.        Behavior: Behavior normal.        Thought Content: Thought content normal.        Judgment: Judgment normal.      BP 113/79 (BP Location: Left Arm, Patient Position: Sitting, Cuff Size: Large)   Pulse 98   Temp 98.7 F (37.1 C) (Oral)   Resp 16   Ht 5\' 5"  (1.651 m)   Wt 224 lb (101.6 kg)   SpO2 100%   BMI 37.28 kg/m  Wt Readings from Last 3 Encounters:  07/28/23 224 lb (101.6 kg)  06/30/23 228 lb (103.4 kg)  02/05/23 225 lb (102.1 kg)       Assessment & Plan:   Problem List Items Addressed This Visit       Unprioritized   Right hip pain   Ortho appointment is scheduled.       Microscopic hematuria   Noted on outside labs. Update UA with micro.       Relevant Orders   Urinalysis, Routine w reflex microscopic (Completed)   Hypertension   BP Readings from  Last 3 Encounters:  07/28/23 113/79  07/15/23 117/71  06/30/23 (!) 120/100  BP stable on metoprolol .        Fibromyalgia muscle pain    Fibromyalgia with widespread pain. Current medications include amitriptyline , topiramate , and methocarbamol . Behavioral health  involved. - Advised pt to discuss potential use of Cymbalta  for FM pain with behavioral health provider.      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)   Clinically stable- management per cardiology- Dr. Revankar.      Chronic pain syndrome    Chronic pain in multiple joints, exacerbated by movement, not controlled with current medications. Diclofenac  effective previously but discontinued due to gastrointestinal concerns. Tylenol  inadequate. Concerns about NSAID use due to renal and gastrointestinal risks. - Consider physical therapy referral for core exercises and strengthening, pending orthopedist approval.      Cervical radiculitis   Offered PT.  She wishes to address hip pain with orthopedist prior to possible PT referral.       Bipolar 2 disorder, major depressive episode [facility])   Following at [facility]- she is interested in finding a closer provider.  I gave her a list of local psychiatrists.      Asthma   Did not tolerate breo.  She is doing well with just singulair  and prn albuterol  at this time.       Other Visit Diagnoses       Borderline diabetes    -  Primary   Relevant Orders   HgB A1c (Completed)      40 minutes spent on today's visit. Time was spent reviewing medical record, active listening, formulating complex medical plan.   I have discontinued Jadeyn Baglio's HYDROcodone -acetaminophen . I am also having her maintain her aspirin  EC, ALPRAZolam, zolpidem, ipratropium-albuterol , ondansetron , hydrOXYzine , methocarbamol , albuterol , Vitamin D3, omega-3 acid ethyl esters, pantoprazole , triamcinolone  cream, azelastine , cromolyn , loratadine ,  atorvastatin , NIFEdipine , amitriptyline , topiramate , isosorbide  mononitrate, montelukast , metoprolol  tartrate, nitroGLYCERIN , and metoprolol  succinate.  No orders of the defined types were placed in this encounter.

## 2023-07-28 NOTE — Assessment & Plan Note (Signed)
 Noted on outside labs. Update UA with micro.

## 2023-07-28 NOTE — Assessment & Plan Note (Addendum)
 BP Readings from Last 3 Encounters:  07/28/23 113/79  07/15/23 117/71  06/30/23 (!) 120/100  BP stable on metoprolol .

## 2023-07-28 NOTE — Assessment & Plan Note (Signed)
 Clinically stable- management per cardiology- Dr. Lafayette Pierre.

## 2023-07-28 NOTE — Assessment & Plan Note (Addendum)
  Fibromyalgia with widespread pain. Current medications include amitriptyline , topiramate , and methocarbamol . Behavioral health involved. - Advised pt to discuss potential use of Cymbalta  for FM pain with behavioral health provider.

## 2023-07-28 NOTE — Assessment & Plan Note (Signed)
 Following at [facility]- she is interested in finding a closer provider.  I gave her a list of local psychiatrists.

## 2023-07-28 NOTE — Patient Instructions (Signed)
Psychiatric Services:  Dr. Milagros Evener [facility]) - [phone] [facility] & [place]) [phone] [facility] [place]) [phone] [facility]) - [phone] [facility], [address] [place], [place] [phone]

## 2023-07-28 NOTE — Assessment & Plan Note (Signed)
  Chronic pain in multiple joints, exacerbated by movement, not controlled with current medications. Diclofenac  effective previously but discontinued due to gastrointestinal concerns. Tylenol  inadequate. Concerns about NSAID use due to renal and gastrointestinal risks. - Consider physical therapy referral for core exercises and strengthening, pending orthopedist approval.

## 2023-07-28 NOTE — Assessment & Plan Note (Signed)
 Offered PT.  She wishes to address hip pain with orthopedist prior to possible PT referral.

## 2023-07-28 NOTE — Assessment & Plan Note (Signed)
 Ortho appointment is scheduled.

## 2023-07-28 NOTE — Assessment & Plan Note (Deleted)
 Following at [facility] for [facility]- she is interested in finding a closer provider.  I gave her a list of local psychiatrists.

## 2023-07-28 NOTE — Assessment & Plan Note (Signed)
 Did not tolerate breo.  She is doing well with just singulair  and prn albuterol  at this time.

## 2023-07-29 ENCOUNTER — Ambulatory Visit

## 2023-07-29 ENCOUNTER — Encounter: Payer: Self-pay | Admitting: Cardiology

## 2023-07-29 ENCOUNTER — Telehealth: Payer: Self-pay | Admitting: Cardiology

## 2023-07-29 DIAGNOSIS — R002 Palpitations: Secondary | ICD-10-CM

## 2023-07-29 NOTE — Telephone Encounter (Signed)
 Pt states she was unaware about her appt this morning and she would like to know the appt reason. Please advise

## 2023-07-29 NOTE — Telephone Encounter (Signed)
 Called the patient and she states that she was not made aware of her appointment today for her heart monitor and she already had a dental appointment during this time. I explained that we could move her monitor appointment to 4/24 at 9:00 am. Patient was agreeable with this plan and had no further questions at this time.

## 2023-07-30 ENCOUNTER — Ambulatory Visit: Attending: Cardiology

## 2023-07-30 ENCOUNTER — Ambulatory Visit

## 2023-08-01 ENCOUNTER — Other Ambulatory Visit: Payer: Self-pay | Admitting: Gastroenterology

## 2023-08-04 ENCOUNTER — Telehealth: Payer: Self-pay

## 2023-08-04 ENCOUNTER — Other Ambulatory Visit (HOSPITAL_COMMUNITY): Payer: Self-pay

## 2023-08-04 MED ORDER — PANTOPRAZOLE SODIUM 40 MG PO TBEC: 40.0000 mg | DELAYED_RELEASE_TABLET | Freq: Every day | ORAL | 3 refills | Status: DC

## 2023-08-04 NOTE — Telephone Encounter (Signed)
 PA team said that Patient has two insurances. Please have insurance run insurance with: ID [id number]  BIN [id number] PCN MEDDPRIME GRP [id number]    Called [facility] and they said that they will run it and see about the protonix 

## 2023-08-04 NOTE — Telephone Encounter (Signed)
 Called pharmacy at [facility] and they said they will run it and see about it for the protonix 

## 2023-08-04 NOTE — Telephone Encounter (Signed)
 Patient has two insurances. Please have insurance run insurance with: ID [id number]  BIN [id number] PCN MEDDPRIME GRP [id number]

## 2023-08-04 NOTE — Addendum Note (Signed)
 Addended by: Junella Olden on: 08/04/2023 09:32 AM   Modules accepted: Orders

## 2023-08-11 ENCOUNTER — Ambulatory Visit: Admitting: Internal Medicine

## 2023-08-13 NOTE — Telephone Encounter (Signed)
Patient is calling to follow up. Please advise.

## 2023-08-18 ENCOUNTER — Ambulatory Visit

## 2023-08-19 ENCOUNTER — Ambulatory Visit: Admitting: Orthopaedic Surgery

## 2023-08-25 ENCOUNTER — Telehealth: Payer: Self-pay | Admitting: Neurology

## 2023-08-25 NOTE — Telephone Encounter (Signed)
 Pt has called requesting her appointment be r/s

## 2023-09-01 ENCOUNTER — Ambulatory Visit: Payer: Medicare Other | Admitting: Neurology

## 2023-09-02 ENCOUNTER — Encounter: Payer: Self-pay | Admitting: Cardiology

## 2023-09-03 ENCOUNTER — Encounter: Payer: Self-pay | Admitting: Family

## 2023-09-03 MED ORDER — IPRATROPIUM-ALBUTEROL 0.5-2.5 (3) MG/3ML IN SOLN: 3.0000 mL | Freq: Four times a day (QID) | RESPIRATORY_TRACT | 1 refills | Status: DC | PRN

## 2023-09-04 ENCOUNTER — Telehealth (HOSPITAL_BASED_OUTPATIENT_CLINIC_OR_DEPARTMENT_OTHER): Payer: Self-pay

## 2023-09-04 MED ORDER — IPRATROPIUM-ALBUTEROL 0.5-2.5 (3) MG/3ML IN SOLN: 3.0000 mL | Freq: Four times a day (QID) | RESPIRATORY_TRACT | 1 refills | Status: DC | PRN

## 2023-09-04 NOTE — Telephone Encounter (Signed)
 Called patient left detail message to call office back.   Copied from CRM [id number]. Topic: Appointments - Scheduling Inquiry for Clinic >> Sep 04, 2023 10:16 AM Renee Brady wrote: Reason for CRM: Patient currently sees Renee Brady. Patient is inquiring about establishing new care with Dr. Clarnce Crow but wants to know if he specializes in critical illnesses because she has MS and other illnesses. She also wants to know if she starts seeing Dr. Clarnce Crow would that remove her from being able to see NP Renee Brady.  Patient may be contacted at [phone].

## 2023-09-04 NOTE — Addendum Note (Signed)
 Addended by: Dorrene Gaucher on: 09/04/2023 12:07 PM   Modules accepted: Orders

## 2023-09-07 DIAGNOSIS — R002 Palpitations: Secondary | ICD-10-CM | POA: Diagnosis not present

## 2023-09-08 ENCOUNTER — Ambulatory Visit (HOSPITAL_BASED_OUTPATIENT_CLINIC_OR_DEPARTMENT_OTHER)
Admission: RE | Admit: 2023-09-08 | Discharge: 2023-09-08 | Disposition: A | Discharge status: Home / Self Care | Source: Ambulatory Visit | Attending: Family | Admitting: Family

## 2023-09-08 ENCOUNTER — Ambulatory Visit (INDEPENDENT_AMBULATORY_CARE_PROVIDER_SITE_OTHER): Admitting: Family

## 2023-09-08 ENCOUNTER — Encounter: Payer: Self-pay | Admitting: Family

## 2023-09-08 VITALS — BP 110/72 | HR 74 | Ht 65.0 in | Wt 226.6 lb

## 2023-09-08 DIAGNOSIS — R2231 Localized swelling, mass and lump, right upper limb: Secondary | ICD-10-CM

## 2023-09-08 DIAGNOSIS — R0781 Pleurodynia: Secondary | ICD-10-CM | POA: Diagnosis present

## 2023-09-08 DIAGNOSIS — N6331 Unspecified lump in axillary tail of the right breast: Secondary | ICD-10-CM

## 2023-09-08 DIAGNOSIS — R059 Cough, unspecified: Secondary | ICD-10-CM | POA: Diagnosis present

## 2023-09-08 DIAGNOSIS — R062 Wheezing: Secondary | ICD-10-CM

## 2023-09-08 DIAGNOSIS — R0789 Other chest pain: Secondary | ICD-10-CM

## 2023-09-08 MED ORDER — DOXYCYCLINE HYCLATE 100 MG PO TABS: 100.0000 mg | ORAL_TABLET | Freq: Two times a day (BID) | ORAL | 0 refills | Status: DC

## 2023-09-08 MED ORDER — PREDNISONE 20 MG PO TABS: 20.0000 mg | ORAL_TABLET | Freq: Every day | ORAL | 0 refills | Status: DC

## 2023-09-08 NOTE — Progress Notes (Signed)
 Renee Brady is a 48 y.o. female with the following history as recorded in EpicCare:  Patient Active Problem List   Diagnosis Date Noted   Right hip pain 07/28/2023   Microscopic hematuria 07/28/2023   Degenerative disc disease, cervical 07/06/2023   Degenerative disc disease, lumbar 07/06/2023   Osteopenia 07/06/2023   Chronic allergic conjunctivitis    Diverticulosis    Essential hypertension    Gastroduodenitis    Hip dysplasia    IBS (irritable bowel syndrome)    Levoscoliosis    Lumbar back pain    Hypermobility spectum disorder 02/16/2023   Joint laxity 09/30/2022   Allergic rhinitis 09/29/2022   Neck pain 08/19/2022   ADHD 08/10/2022   Encounter for Medicare annual wellness exam 07/01/2022   Allergic conjunctivitis 04/14/2022   Bipolar 2 disorder, major depressive episode (HCC) 04/14/2022   Chronic pain syndrome 04/14/2022   Hyperlipidemia 04/14/2022   Hypertension 04/14/2022   PTSD (post-traumatic stress disorder) 04/14/2022   RBC microcytosis 04/14/2022   Subcutaneous nodule of chest wall 04/14/2022   Gait disturbance 02/11/2022   Prediabetes 10/18/2021   Midline low back pain 10/18/2021   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   CAD (coronary artery disease) 07/03/2020   Angina pectoris (HCC) 05/25/2020   Obesity (BMI 35.0-39.9 without comorbidity) 05/25/2020   Fibromyalgia muscle pain    Vision abnormalities    Raynaud's disease    Iron deficiency anemia    GERD (gastroesophageal reflux disease)    Asthma    Epidural fibrosis 06/15/2019   Lumbar radiculopathy 01/26/2019   Dysesthesia 01/26/2019   High risk medication use 01/29/2018   Other headache syndrome 01/29/2018   Cervical radiculitis 10/23/2016   Herniated nucleus pulposus, cervical 10/23/2016   Spinal stenosis in cervical region 10/23/2016   Panic disorder with agoraphobia 04/24/2015   Sensitivity to medication 04/24/2015   Anxiety and depression 04/03/2015    Multiple sclerosis (HCC) 12/22/2014   Chronic pain of left knee 09/18/2014   Ankle pain, left 08/27/2014   Vitamin D  deficiency 07/28/2014   Pes anserinus bursitis of right knee 07/24/2014   Trochanteric bursitis of right hip 07/13/2014   ANA positive 06/26/2014   Numbness and tingling 04/11/2014    Current Outpatient Medications  Medication Sig Dispense Refill   albuterol  (VENTOLIN  HFA) 108 (90 Base) MCG/ACT inhaler Inhale 2 puffs into the lungs every 6 (six) hours as needed for wheezing or shortness of breath. 54 g 1   ALPRAZolam (XANAX) 0.5 MG tablet Take 0.5 mg by mouth daily as needed for anxiety.     amitriptyline  (ELAVIL ) 50 MG tablet TAKE 1 TABLET BY MOUTH EVERYDAY AT BEDTIME 90 tablet 1   aspirin  EC 81 MG tablet Take 1 tablet (81 mg total) by mouth daily. Swallow whole. 90 tablet 3   atorvastatin  (LIPITOR) 20 MG tablet Take 1 tablet (20 mg total) by mouth daily. 90 tablet 3   azelastine  (ASTELIN ) 0.1 % nasal spray Place 1-2 sprays into both nostrils 2 (two) times daily as needed. (Patient taking differently: Place 1-2 sprays into both nostrils in the morning.) 30 mL 5   Cholecalciferol (VITAMIN D3) 25 MCG (1000 UT) CAPS Take 1,000 Units by mouth in the morning.     cromolyn  (OPTICROM ) 4 % ophthalmic solution Place 1 drop into both eyes 4 (four) times daily. (Patient taking differently: Place 1 drop into both eyes daily.) 30 mL 1   doxycycline (VIBRA-TABS) 100 MG tablet Take 1 tablet (100 mg total) by mouth  2 (two) times daily. 14 tablet 0   hydrOXYzine  (ATARAX ) 25 MG tablet TAKE 1 TABLET BY MOUTH 3 TIMES DAILY AS NEEDED FOR ITCHING. 270 tablet 2   ipratropium-albuterol  (DUONEB) 0.5-2.5 (3) MG/3ML SOLN Take 3 mLs by nebulization every 6 (six) hours as needed (wheezing). 1180 mL 1   isosorbide  mononitrate (IMDUR ) 30 MG 24 hr tablet TAKE 1 TABLET BY MOUTH EVERY DAY 90 tablet 2   loratadine  (CLARITIN ) 10 MG tablet Take 1 tablet (10 mg total) by mouth daily.     methocarbamol  (ROBAXIN )  500 MG tablet Take 1 tablet (500 mg total) by mouth every 6 (six) hours as needed for muscle spasms. 360 tablet 0   metoprolol  succinate (TOPROL -XL) 50 MG 24 hr tablet Take 1 tablet (50 mg total) by mouth daily. 90 tablet 3   montelukast  (SINGULAIR ) 10 MG tablet TAKE 1 TABLET BY MOUTH EVERYDAY AT BEDTIME 90 tablet 1   NIFEdipine  (PROCARDIA -XL/NIFEDICAL-XL) 30 MG 24 hr tablet Take 1 tablet (30 mg total) by mouth daily. 90 tablet 1   nitroGLYCERIN  (NITROSTAT ) 0.4 MG SL tablet Place 1 tablet (0.4 mg total) under the tongue every 5 (five) minutes as needed for chest pain. 25 tablet 3   omega-3 acid ethyl esters (LOVAZA) 1 g capsule Take 1 g by mouth daily.     ondansetron  (ZOFRAN ) 4 MG tablet Take 1 tablet (4 mg total) by mouth daily as needed for nausea or vomiting. 90 tablet 2   pantoprazole  (PROTONIX ) 40 MG tablet Take 1 tablet (40 mg total) by mouth daily. 90 tablet 3   predniSONE (DELTASONE) 20 MG tablet Take 1 tablet (20 mg total) by mouth daily with breakfast. 5 tablet 0   topiramate  (TOPAMAX ) 50 MG tablet Take 1 tablet (50 mg total) by mouth daily. 90 tablet 3   triamcinolone  cream (KENALOG ) 0.1 % Apply 1 Application topically 2 (two) times daily. (Patient taking differently: Apply 1 Application topically 2 (two) times daily as needed (skin irritation/rash.).) 60 g 1   zolpidem (AMBIEN) 5 MG tablet Take 5 mg by mouth at bedtime.     metoprolol  tartrate (LOPRESSOR ) 100 MG tablet Take 1 tablet (100 mg total) by mouth once for 1 dose. Take 2 hours prior to your CT if your heart rate is greater than 55 1 tablet 0   No current facility-administered medications for this visit.    Allergies: Breo ellipta  [fluticasone  furoate-vilanterol], Inderal [propranolol], Lactose, Lactose intolerance (gi), Latex, Mobic [meloxicam], Neurontin [gabapentin], Oxycodone  hcl, Percocet [oxycodone -acetaminophen ], Prozac [fluoxetine hcl], Serotonin, Serotonin reuptake inhibitors (ssris), Tecfidera [dimethyl fumarate],  Trospium  chloride, Wixela inhub [fluticasone -salmeterol], Zoloft [sertraline], Buspirone  hcl, Hydrochlorothiazide, and Penicillins  Past Medical History:  Diagnosis Date   ADHD    Allergic conjunctivitis    Allergic rhinitis 09/29/2022   ANA positive 06/26/2014   Formatting of this note might be different from the original. Overview:  Evaluated by Rheumatology Dr. Lindle Rhea - dx fibromyalgia given.   Angina pectoris (HCC) 05/25/2020   Ankle pain, left 08/27/2014   Formatting of this note might be different from the original. Overview:  Fell on this day with injury to Lt foot and ankle.   Anxiety and depression 04/03/2015   Asthma    Bipolar 2 disorder, major depressive episode (HCC)    suspected due to being on SSRIs and causing irritability   CAD (coronary artery disease) 07/03/2020   Cervical radiculitis 10/23/2016   Chronic allergic conjunctivitis    Chronic pain of left knee 09/18/2014   Formatting of  this note might be different from the original. Overview:  Evaluated by Ortho.   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   Iron deficiency anemia    Joint laxity 09/30/2022   Levoscoliosis    Lumbar back pain    Lumbar radiculopathy 01/26/2019   Midline low back pain 10/18/2021   Mixed dyslipidemia 05/25/2020   Multiple sclerosis (HCC)  12/22/2014   Overview:  JC virus antibody positive with index 2.67- checked 12/2014   Nausea 08/23/2018   Neck pain 08/19/2022   Numbness and tingling 04/11/2014   Obesity (BMI 35.0-39.9 without comorbidity) 05/25/2020   Other headache syndrome 01/29/2018   Panic disorder with agoraphobia 04/24/2015   Pes anserinus bursitis of right knee 07/24/2014   Formatting of this note might be different from the original. PT, Life-style modification, and NSAID PRN.   Prediabetes 10/18/2021   PTSD (post-traumatic stress disorder)    Raynaud's disease 06/05/2014   RBC microcytosis    Rosai-Dorfman disease (HCC)    Rosai-Dorfman disease (HCC)    11/24   Sensitivity to medication 04/24/2015    Includes all SSRI's (Prozac, Paxil, Lexapro, Zoloft).    Also Wellbutrin, hydroxyzine , and gabapentin.   Reactions are paradoxical in nature with hyper-stimulatory response,   mood changes, and panic attacks.   Amitriptyline  has been tolerated well (2016).     Spinal stenosis in cervical region 10/23/2016   Subcutaneous nodule of chest wall 04/14/2022   Trochanteric bursitis of right hip 07/13/2014   Formatting of this note might be different from the original. Evaluated by Ortho.  Tx with CSI and PT.   Urinary urgency 01/29/2018   Vision abnormalities    Vitamin D  deficiency 07/28/2014   Formatting of this note might be different from the original. Daily supplement.    Past Surgical History:  Procedure Laterality Date   ABDOMINAL HYSTERECTOMY     uterus and cervix removed but still has ovaries   CESAREAN SECTION  x 2   COLONOSCOPY  12/28/2009   Mild sigmoid diverticulosis. Small internal hemorrhoids.   ESOPHAGOGASTRODUODENOSCOPY  12/28/2009   Mild gastroduodenitis. Otherwise normal EGD   LAPAROSCOPIC REMOVAL ABDOMINAL MASS Right 02/05/2023   Procedure: LAPAROSCOPIC REMOVAL RIGHT RETROPERIONEAL MASS;  Surgeon: Lujean Sake, MD;  Location: [facility] OR;  Service: General;  Laterality: Right;   LEFT HEART  CATH AND CORONARY ANGIOGRAPHY N/A 06/08/2020   Procedure: LEFT HEART CATH AND CORONARY ANGIOGRAPHY;  Surgeon: Odie Benne, MD;  Location: [facility] INVASIVE [facility];  Service: Cardiovascular;  Laterality: N/A;   lumbar laminectomy Left 11/02/2016   TONSILLECTOMY     WISDOM TOOTH EXTRACTION      Family History  Problem Relation Age of Onset   Asthma Mother    Stroke Mother    Cardiomyopathy Mother    Hypertension Mother  Diabetes Mother    Hypertension Father    ADD / ADHD Father    Depression Father    Kidney disease Father        Stage 3   Diabetes Brother    Bone cancer Maternal Grandmother    Heart attack Paternal Grandmother    CAD Paternal Grandfather    Prostate cancer Paternal Grandfather    Allergic rhinitis Daughter    Asthma Daughter    ADD / ADHD Daughter    Eczema Son    Allergic rhinitis Son    Asthma Son    Esophageal cancer Maternal Uncle    Eczema Granddaughter    Colon cancer Neg Hx    Rectal cancer Neg Hx    Stomach cancer Neg Hx    Immunodeficiency Neg Hx     Social History   Tobacco Use   Smoking status: Former    Current packs/day: 0.00    Types: Cigarettes    Quit date: 2005    Years since quitting: 20.4   Smokeless tobacco: Never  Substance Use Topics   Alcohol use: No    Subjective:   Concerned about chest congestion x 2 weeks; notes that she also fell a few days ago on her right side and is requesting rib X-ray if provider feels this is appropriate;  Does have underlying asthma; no fever but does hear herself wheezing;    Objective:  Vitals:   09/08/23 0951  BP: 110/72  Pulse: 74  SpO2: 96%  Weight: 226 lb 9.6 oz (102.8 kg)  Height: 5\' 5"  (1.651 m)    General: Well developed, well nourished, in no acute distress  Skin : Warm and dry.  Head: Normocephalic and atraumatic  Eyes: Sclera and conjunctiva clear; pupils round and reactive to light; extraocular movements intact  Ears: External normal; canals clear; tympanic  membranes normal  Oropharynx: Pink, supple. No suspicious lesions  Neck: Supple without thyromegaly, adenopathy  Lungs: Respirations unlabored; wheezing noted in all 4 lobes;  CVS exam: normal rate and regular rhythm.  Vessels: Symmetric bilaterally  Neurologic: Alert and oriented; speech intact; face symmetrical; moves all extremities well; CNII-XII intact without focal deficit   Assessment:  1. Rib pain on right side   2. Wheezing   3. Cough, unspecified type   4. Axillary lump, right   5. Unspecified lump in axillary tail of the right breast     Plan:  Update CXR with right rib X-ray; Rx for Doxycycline and Prednisone; follow up to be determined based on CXR;  Will update right diagnostic mammogram due to subjective concern for changing lump in right axillary region;    No follow-ups on file.  Orders Placed This Encounter  Procedures   DG Ribs Unilateral W/Chest Right    Standing Status:   Future    Number of Occurrences:   1    Expiration Date:   03/09/2024    Reason for Exam (SYMPTOM  OR DIAGNOSIS REQUIRED):   cough/ wheezing/ right rib injury    Is the patient pregnant?:   No    Preferred imaging location?:   [facility]   MM 3D DIAGNOSTIC MAMMOGRAM UNILATERAL RIGHT BREAST    Standing Status:   Future    Expiration Date:   09/07/2024    Reason for Exam (SYMPTOM  OR DIAGNOSIS REQUIRED):   lump right axillary    Is the patient pregnant?:   No    Preferred imaging location?:   [facility]  Requested Prescriptions   Signed Prescriptions Disp Refills   predniSONE (DELTASONE) 20 MG tablet 5 tablet 0    Sig: Take 1 tablet (20 mg total) by mouth daily with breakfast.   doxycycline (VIBRA-TABS) 100 MG tablet 14 tablet 0    Sig: Take 1 tablet (100 mg total) by mouth 2 (two) times daily.

## 2023-09-09 ENCOUNTER — Other Ambulatory Visit: Payer: Self-pay | Admitting: Family

## 2023-09-09 ENCOUNTER — Encounter: Payer: Self-pay | Admitting: Family

## 2023-09-09 ENCOUNTER — Ambulatory Visit: Payer: Self-pay | Admitting: Cardiology

## 2023-09-09 DIAGNOSIS — N6331 Unspecified lump in axillary tail of the right breast: Secondary | ICD-10-CM

## 2023-09-09 DIAGNOSIS — R2231 Localized swelling, mass and lump, right upper limb: Secondary | ICD-10-CM

## 2023-09-15 ENCOUNTER — Ambulatory Visit: Payer: Self-pay | Admitting: Family

## 2023-09-19 ENCOUNTER — Other Ambulatory Visit: Payer: Self-pay | Admitting: Family

## 2023-09-22 ENCOUNTER — Encounter: Payer: Self-pay | Admitting: Family

## 2023-09-22 ENCOUNTER — Ambulatory Visit
Admission: RE | Admit: 2023-09-22 | Discharge: 2023-09-22 | Disposition: A | Discharge status: Home / Self Care | Source: Ambulatory Visit | Attending: Family | Admitting: Family

## 2023-09-22 DIAGNOSIS — N6331 Unspecified lump in axillary tail of the right breast: Secondary | ICD-10-CM

## 2023-09-22 DIAGNOSIS — R2231 Localized swelling, mass and lump, right upper limb: Secondary | ICD-10-CM

## 2023-09-24 ENCOUNTER — Encounter: Payer: Self-pay | Admitting: Family

## 2023-09-24 MED ORDER — METHOCARBAMOL 500 MG PO TABS: 500.0000 mg | ORAL_TABLET | Freq: Four times a day (QID) | ORAL | 1 refills | Status: DC | PRN

## 2023-09-24 MED ORDER — HYDROXYZINE HCL 25 MG PO TABS: ORAL_TABLET | ORAL | 1 refills | Status: DC

## 2023-09-24 MED ORDER — ALBUTEROL SULFATE HFA 108 (90 BASE) MCG/ACT IN AERS: 2.0000 | INHALATION_SPRAY | Freq: Four times a day (QID) | RESPIRATORY_TRACT | 1 refills | Status: DC | PRN

## 2023-09-24 MED ORDER — ONDANSETRON HCL 4 MG PO TABS: 4.0000 mg | ORAL_TABLET | Freq: Every day | ORAL | 1 refills | Status: DC | PRN

## 2023-09-25 ENCOUNTER — Other Ambulatory Visit: Payer: Self-pay | Admitting: Family

## 2023-09-25 MED ORDER — AMITRIPTYLINE HCL 50 MG PO TABS: 50.0000 mg | ORAL_TABLET | Freq: Every day | ORAL | 0 refills | Status: DC

## 2023-09-25 NOTE — Addendum Note (Signed)
 Addended by: Dorrene Gaucher on: 09/25/2023 12:34 PM   Modules accepted: Orders

## 2023-09-28 ENCOUNTER — Encounter (HOSPITAL_BASED_OUTPATIENT_CLINIC_OR_DEPARTMENT_OTHER): Payer: Self-pay | Admitting: Family Medicine

## 2023-09-28 ENCOUNTER — Ambulatory Visit (INDEPENDENT_AMBULATORY_CARE_PROVIDER_SITE_OTHER): Admitting: Family Medicine

## 2023-09-28 VITALS — BP 123/88 | HR 104 | Temp 98.5°F | Resp 16 | Ht 65.0 in | Wt 226.3 lb

## 2023-09-28 DIAGNOSIS — F4001 Agoraphobia with panic disorder: Secondary | ICD-10-CM | POA: Diagnosis not present

## 2023-09-28 DIAGNOSIS — I2510 Atherosclerotic heart disease of native coronary artery without angina pectoris: Secondary | ICD-10-CM

## 2023-09-28 DIAGNOSIS — G35 Multiple sclerosis: Secondary | ICD-10-CM

## 2023-09-28 DIAGNOSIS — I10 Essential (primary) hypertension: Secondary | ICD-10-CM | POA: Diagnosis not present

## 2023-09-28 NOTE — Assessment & Plan Note (Addendum)
 Stable.  Continue reviewing her extensive PMH.  Need to get updated records from [facility] and Dr. Charlanne, especially regarding her Liver mass.  Also request records from Dr. Vear, and Rheumatology.  Arrange for close followup as we continue to get to know her.

## 2023-09-28 NOTE — Progress Notes (Signed)
 Established Patient Office Visit  Subjective   Patient ID: Renee Brady, female    DOB: 05/11/1975  Age: 48 y.o. MRN: [id number]  Chief Complaint  Patient presents with   Establish Care    Establish care.     F/u as above.  New to my practice.  Her history is complex and reviewed in detail.  She has numerous concerns today, but admits that she is overall pretty stable.  Extended discussion about numerous topics today.    Past Medical History:  Diagnosis Date   Allergic rhinitis 09/29/2022   Anxiety and depression 04/03/2015   f/by [facility]   Asthma    CAD (coronary artery disease) 07/03/2020   f/by Dr. Edwyna   Chronic pain syndrome 02/16/2014   focused in her spine, and f/by [facility]   Diverticulosis    Per patient.   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 Charlanne   Levoscoliosis    Mixed dyslipidemia 05/25/2020   Multiple sclerosis (HCC) 12/22/2014   f/by Dr. Vear   Obesity (BMI 35.0-39.9 without comorbidity) 05/25/2020   Osteoarthritis    f/by Dr. Yvone of Orthopedics   Panic disorder with agoraphobia 04/24/2015   PTSD (post-traumatic stress disorder)    Raynaud's disease 06/05/2014   Sensitivity to medication 04/24/2015    Includes all SSRI's (Prozac, Paxil, Lexapro, Zoloft).    Also Wellbutrin, hydroxyzine , and gabapentin.   Reactions are paradoxical in nature with hyper-stimulatory response,   mood changes, and panic attacks.   Amitriptyline  has been tolerated well (2016).     Spinal stenosis in cervical region 10/23/2016   Vitamin D  deficiency 07/28/2014    Outpatient Encounter Medications as of 09/28/2023  Medication Sig   albuterol  (VENTOLIN  HFA) 108 (90 Base) MCG/ACT inhaler Inhale 2 puffs into the lungs every 6 (six) hours as needed for wheezing or shortness of  breath.   ALPRAZolam (XANAX) 0.5 MG tablet Take 0.5 mg by mouth daily as needed for anxiety.   amitriptyline  (ELAVIL ) 50 MG tablet Take 1 tablet (50 mg total) by mouth at bedtime.   aspirin  EC 81 MG tablet Take 1 tablet (81 mg total) by mouth daily. Swallow whole.   atorvastatin  (LIPITOR) 20 MG tablet Take 1 tablet (20 mg total) by mouth daily.   azelastine  (ASTELIN ) 0.1 % nasal spray Place 1-2 sprays into both nostrils 2 (two) times daily as needed.   Cholecalciferol (VITAMIN D3) 25 MCG (1000 UT) CAPS Take 1,000 Units by mouth in the morning.   cromolyn  (OPTICROM ) 4 % ophthalmic solution Place 1 drop into both eyes 4 (four) times daily.   doxycycline  (VIBRA -TABS) 100 MG tablet Take 1 tablet (100 mg total) by mouth 2 (two) times daily.   hydrOXYzine  (ATARAX ) 25 MG tablet TAKE 1 TABLET BY MOUTH 3 TIMES DAILY AS NEEDED FOR ITCHING.   ipratropium-albuterol  (DUONEB) 0.5-2.5 (3) MG/3ML SOLN Take 3 mLs by nebulization every 6 (six) hours as needed (wheezing).   isosorbide  mononitrate (IMDUR ) 30 MG 24 hr tablet TAKE 1 TABLET BY MOUTH EVERY DAY   loratadine  (CLARITIN ) 10 MG tablet Take 1 tablet (10 mg total) by mouth daily.   methocarbamol  (ROBAXIN ) 500 MG tablet Take 1 tablet (500 mg total) by mouth every 6 (six) hours as needed for muscle spasms.   metoprolol  succinate (TOPROL -XL) 50  MG 24 hr tablet Take 1 tablet (50 mg total) by mouth daily.   montelukast  (SINGULAIR ) 10 MG tablet TAKE 1 TABLET BY MOUTH EVERYDAY AT BEDTIME   NIFEdipine  (PROCARDIA -XL/NIFEDICAL-XL) 30 MG 24 hr tablet TAKE 1 TABLET BY MOUTH EVERY DAY   nitroGLYCERIN  (NITROSTAT ) 0.4 MG SL tablet Place 1 tablet (0.4 mg total) under the tongue every 5 (five) minutes as needed for chest pain.   omega-3 acid ethyl esters (LOVAZA) 1 g capsule Take 1 g by mouth daily.   ondansetron  (ZOFRAN ) 4 MG tablet Take 1 tablet (4 mg total) by mouth daily as needed for nausea or vomiting.   pantoprazole  (PROTONIX ) 40 MG tablet Take 1 tablet (40 mg total) by  mouth daily.   topiramate  (TOPAMAX ) 50 MG tablet Take 1 tablet (50 mg total) by mouth daily.   traMADol (ULTRAM) 50 MG tablet Take 50 mg by mouth every 6 (six) hours as needed.   triamcinolone  cream (KENALOG ) 0.1 % Apply 1 Application topically 2 (two) times daily.   zolpidem (AMBIEN) 5 MG tablet Take 5 mg by mouth at bedtime.   metoprolol  tartrate (LOPRESSOR ) 100 MG tablet Take 1 tablet (100 mg total) by mouth once for 1 dose. Take 2 hours prior to your CT if your heart rate is greater than 55 (Patient not taking: Reported on 09/28/2023)   predniSONE  (DELTASONE ) 20 MG tablet Take 1 tablet (20 mg total) by mouth daily with breakfast. (Patient not taking: Reported on 09/28/2023)   No facility-administered encounter medications on file as of 09/28/2023.    Social History   Tobacco Use   Smoking status: Former    Current packs/day: 0.00    Types: Cigarettes    Quit date: 2005    Years since quitting: 20.4   Smokeless tobacco: Never  Vaping Use   Vaping status: Never Used  Substance Use Topics   Alcohol use: No   Drug use: Yes    Types: Marijuana    Comment: marijuana use daily      Review of Systems  Constitutional:  Negative for diaphoresis, fever, malaise/fatigue and weight loss.  Respiratory:  Negative for cough, shortness of breath and wheezing.   Cardiovascular:  Negative for chest pain, palpitations, orthopnea, claudication, leg swelling and PND.  Psychiatric/Behavioral:  Positive for memory loss. Negative for depression, hallucinations, substance abuse and suicidal ideas. The patient is nervous/anxious.       Objective:     BP 123/88 (BP Location: Right Arm, Patient Position: Standing, Cuff Size: Normal)   Pulse (!) 104   Temp 98.5 F (36.9 C) (Oral)   Resp 16   Ht 5' 5 (1.651 m)   Wt 226 lb 4.8 oz (102.6 kg)   SpO2 98%   BMI 37.66 kg/m    Physical Exam Constitutional:      General: She is not in acute distress.    Appearance: Normal appearance. She is  obese.  HENT:     Head: Normocephalic.  Neck:     Vascular: No carotid bruit.   Cardiovascular:     Rate and Rhythm: Normal rate and regular rhythm.     Pulses: Normal pulses.     Heart sounds: Normal heart sounds.  Pulmonary:     Effort: Pulmonary effort is normal.     Breath sounds: Normal breath sounds.  Abdominal:     General: Bowel sounds are normal.     Palpations: Abdomen is soft.   Musculoskeletal:     Cervical back: Neck supple. No  tenderness.     Right lower leg: No edema.     Left lower leg: No edema.   Neurological:     Mental Status: She is alert.   Psychiatric:     Comments: Pleasant, anxious      No results found for any visits on 09/28/23.    The ASCVD Risk score (Arnett DK, et al., 2019) failed to calculate for the following reasons:   The valid total cholesterol range is 130 to 320 mg/dL    Assessment & Plan:  Multiple sclerosis (HCC) Assessment & Plan: Stable.  Continue reviewing her extensive PMH.  Need to get updated records from [facility] and Dr. Charlanne, especially regarding her Liver mass.  Also request records from Dr. Vear, and Rheumatology.  Arrange for close followup as we continue to get to know her.   Coronary artery disease involving native heart without angina pectoris, unspecified vessel or lesion type  Essential hypertension  Panic disorder with agoraphobia    Return in about 2 weeks (around 10/12/2023) for chronic follow-up.    REDDING PONCE NORLEEN FALCON., MD

## 2023-10-12 ENCOUNTER — Ambulatory Visit (HOSPITAL_BASED_OUTPATIENT_CLINIC_OR_DEPARTMENT_OTHER): Admitting: Family Medicine

## 2023-10-27 ENCOUNTER — Other Ambulatory Visit: Payer: Self-pay | Admitting: Orthopedic Surgery

## 2023-10-27 DIAGNOSIS — M542 Cervicalgia: Secondary | ICD-10-CM

## 2023-10-28 ENCOUNTER — Encounter: Payer: Self-pay | Admitting: Orthopedic Surgery

## 2023-10-31 ENCOUNTER — Ambulatory Visit
Admission: RE | Admit: 2023-10-31 | Discharge: 2023-10-31 | Disposition: A | Discharge status: Home / Self Care | Source: Ambulatory Visit | Attending: Orthopedic Surgery | Admitting: Orthopedic Surgery

## 2023-10-31 DIAGNOSIS — M542 Cervicalgia: Secondary | ICD-10-CM

## 2023-11-24 ENCOUNTER — Telehealth: Payer: Self-pay | Admitting: *Deleted

## 2023-11-24 ENCOUNTER — Encounter: Payer: Self-pay | Admitting: Neurology

## 2023-11-24 ENCOUNTER — Ambulatory Visit (INDEPENDENT_AMBULATORY_CARE_PROVIDER_SITE_OTHER): Admitting: Neurology

## 2023-11-24 ENCOUNTER — Other Ambulatory Visit (HOSPITAL_COMMUNITY): Payer: Self-pay

## 2023-11-24 VITALS — BP 123/83 | HR 63 | Ht 65.0 in | Wt 225.5 lb

## 2023-11-24 DIAGNOSIS — R4789 Other speech disturbances: Secondary | ICD-10-CM

## 2023-11-24 DIAGNOSIS — Z79899 Other long term (current) drug therapy: Secondary | ICD-10-CM

## 2023-11-24 DIAGNOSIS — G35 Multiple sclerosis: Secondary | ICD-10-CM

## 2023-11-24 DIAGNOSIS — R269 Unspecified abnormalities of gait and mobility: Secondary | ICD-10-CM

## 2023-11-24 DIAGNOSIS — R413 Other amnesia: Secondary | ICD-10-CM

## 2023-11-24 DIAGNOSIS — R3915 Urgency of urination: Secondary | ICD-10-CM | POA: Diagnosis not present

## 2023-11-24 MED ORDER — SOLIFENACIN SUCCINATE 10 MG PO TABS: ORAL_TABLET | ORAL | 11 refills | Status: AC

## 2023-11-24 MED ORDER — MODAFINIL 200 MG PO TABS: 200.0000 mg | ORAL_TABLET | Freq: Every day | ORAL | 5 refills | Status: DC

## 2023-11-24 NOTE — Telephone Encounter (Signed)
 Received fax that PA modafinil  200mg  needed. Covermymeds key: [id number]
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
## 2023-11-24 NOTE — Telephone Encounter (Signed)
 Pharmacy Patient Advocate Encounter  Received notification from [facility] that Prior Authorization for Modafinil  200mg  Tablets has been APPROVED from 11/24/2023 to 11/23/2024. Ran test claim, Copay is $10.00. This test claim was processed through [facility]- copay amounts may vary at other pharmacies due to pharmacy/plan contracts, or as the patient moves through the different stages of their insurance plan.   PA #/Case ID/Reference #: [id number]

## 2023-11-25 ENCOUNTER — Telehealth: Payer: Self-pay | Admitting: Neurology

## 2023-11-25 ENCOUNTER — Encounter: Payer: Self-pay | Admitting: Neurology

## 2023-11-25 NOTE — Telephone Encounter (Signed)
 Sent to [facility] they will get the [employer]. [phone]

## 2023-11-26 ENCOUNTER — Other Ambulatory Visit: Payer: Self-pay | Admitting: *Deleted

## 2023-11-26 DIAGNOSIS — R5383 Other fatigue: Secondary | ICD-10-CM

## 2023-11-26 DIAGNOSIS — G35 Multiple sclerosis: Secondary | ICD-10-CM

## 2023-11-27 MED ORDER — MODAFINIL 200 MG PO TABS: 200.0000 mg | ORAL_TABLET | Freq: Every day | ORAL | 5 refills | Status: DC

## 2023-11-29 ENCOUNTER — Other Ambulatory Visit: Payer: Self-pay | Admitting: Family

## 2023-11-30 ENCOUNTER — Encounter (HOSPITAL_BASED_OUTPATIENT_CLINIC_OR_DEPARTMENT_OTHER): Payer: Self-pay | Admitting: Family Medicine

## 2023-11-30 ENCOUNTER — Other Ambulatory Visit: Payer: Self-pay | Admitting: Neurology

## 2023-11-30 DIAGNOSIS — G35 Multiple sclerosis: Secondary | ICD-10-CM

## 2023-11-30 DIAGNOSIS — G4733 Obstructive sleep apnea (adult) (pediatric): Secondary | ICD-10-CM

## 2023-11-30 DIAGNOSIS — N3941 Urge incontinence: Secondary | ICD-10-CM

## 2023-12-01 ENCOUNTER — Telehealth: Payer: Self-pay | Admitting: Neurology

## 2023-12-01 ENCOUNTER — Ambulatory Visit
Admission: RE | Admit: 2023-12-01 | Discharge: 2023-12-01 | Disposition: A | Discharge status: Home / Self Care | Source: Ambulatory Visit | Attending: Neurology | Admitting: Neurology

## 2023-12-01 DIAGNOSIS — G35 Multiple sclerosis: Secondary | ICD-10-CM

## 2023-12-01 DIAGNOSIS — R4789 Other speech disturbances: Secondary | ICD-10-CM

## 2023-12-01 DIAGNOSIS — R269 Unspecified abnormalities of gait and mobility: Secondary | ICD-10-CM

## 2023-12-01 MED ORDER — GADOPICLENOL 0.5 MMOL/ML IV SOLN
10.0000 mL | Freq: Once | INTRAVENOUS | Status: AC | PRN
  Administered 2023-12-01: 10 mL via INTRAVENOUS

## 2023-12-01 NOTE — Telephone Encounter (Signed)
 Referral for urology fax to [facility]. [phone], Fax: [phone]

## 2023-12-02 ENCOUNTER — Ambulatory Visit: Payer: Self-pay | Admitting: Neurology

## 2023-12-03 ENCOUNTER — Ambulatory Visit (INDEPENDENT_AMBULATORY_CARE_PROVIDER_SITE_OTHER): Admitting: Neurology

## 2023-12-03 DIAGNOSIS — G4733 Obstructive sleep apnea (adult) (pediatric): Secondary | ICD-10-CM

## 2023-12-15 ENCOUNTER — Encounter: Payer: Self-pay | Admitting: Neurology

## 2023-12-15 NOTE — Progress Notes (Signed)
   [facility]  HOME SLEEP STUDY  STUDY DATE:   12/06/2023 PATIENT NAME: Renee Brady DOB: 06/17/1975 MRN: [id number]  ORDERING CLINICIAN: Richard A. Vear, MD, PhD   CLINICAL INFORMATION: 48 year old woman with obstructive sleep apnea   IMPRESSION:  Mild obstructive sleep apnea with a pAHI 3% = 5.3/h.   OSA was moderate during REM sleep with REM pAHI 3% = 17.8/h No nocturnal hypoxemia. Normal sleep efficiency of 83%.   RECOMMENDATION: As OSA is mild, consider weight loss initially.  If unsuccessful, consider CPAP therapy if excessive daytime sleepiness is also occurring. Follow-up with Dr. Vear.   INTERPRETING PHYSICIAN:   Richard A. Vear, MD, PhD, [facility] Certified in Neurology, Clinical Neurophysiology, Sleep Medicine, Pain Medicine and Neuroimaging  [facility] [address], [address] [place], [place] [address] [phone]

## 2023-12-16 ENCOUNTER — Other Ambulatory Visit: Payer: Self-pay | Admitting: Surgery

## 2023-12-16 ENCOUNTER — Encounter: Payer: Self-pay | Admitting: Surgery

## 2023-12-16 DIAGNOSIS — D3501 Benign neoplasm of right adrenal gland: Secondary | ICD-10-CM

## 2023-12-18 ENCOUNTER — Other Ambulatory Visit: Payer: Self-pay | Admitting: Surgery

## 2023-12-18 ENCOUNTER — Encounter: Payer: Self-pay | Admitting: Surgery

## 2023-12-18 ENCOUNTER — Telehealth (HOSPITAL_BASED_OUTPATIENT_CLINIC_OR_DEPARTMENT_OTHER): Payer: Self-pay | Admitting: *Deleted

## 2023-12-18 DIAGNOSIS — D3501 Benign neoplasm of right adrenal gland: Secondary | ICD-10-CM

## 2023-12-20 ENCOUNTER — Other Ambulatory Visit: Payer: Self-pay | Admitting: Family

## 2023-12-21 ENCOUNTER — Other Ambulatory Visit

## 2023-12-22 ENCOUNTER — Other Ambulatory Visit: Payer: Self-pay | Admitting: *Deleted

## 2023-12-22 DIAGNOSIS — G4733 Obstructive sleep apnea (adult) (pediatric): Secondary | ICD-10-CM

## 2023-12-23 NOTE — Telephone Encounter (Signed)
 Completed.

## 2023-12-25 ENCOUNTER — Ambulatory Visit
Admission: RE | Admit: 2023-12-25 | Discharge: 2023-12-25 | Disposition: A | Discharge status: Home / Self Care | Source: Ambulatory Visit | Attending: Surgery | Admitting: Surgery

## 2023-12-25 DIAGNOSIS — D3501 Benign neoplasm of right adrenal gland: Secondary | ICD-10-CM

## 2023-12-25 MED ORDER — IOPAMIDOL (ISOVUE-300) INJECTION 61%
100.0000 mL | Freq: Once | INTRAVENOUS | Status: AC | PRN
  Administered 2023-12-25: 100 mL via INTRAVENOUS

## 2024-01-11 ENCOUNTER — Encounter: Payer: Self-pay | Admitting: Neurology

## 2024-01-11 ENCOUNTER — Telehealth: Payer: Self-pay | Admitting: Gastroenterology

## 2024-01-11 NOTE — Telephone Encounter (Signed)
 Sent community message to [facility] to see what they need, waiting on response

## 2024-01-11 NOTE — Telephone Encounter (Signed)
 Patient requesting medication refill for pantoprazole . Please advise.

## 2024-01-11 NOTE — Telephone Encounter (Signed)
 Patient has been notified that she has year supply when the script was sent on 08-04-23. She was [facility] about this and she voiced understanding. She said she was having some GI issues but declined me to send a message to the nurse and not to schedule her. Said she will call back and doesn't want to add on any other appointments to her schedule at this time

## 2024-01-12 NOTE — Telephone Encounter (Signed)
 Waiting on response from MD. Late entry from yesterday.

## 2024-01-25 ENCOUNTER — Encounter: Payer: Self-pay | Admitting: Family

## 2024-01-25 NOTE — Telephone Encounter (Signed)
Pt called to reschedule.  Closing encounter.

## 2024-01-25 NOTE — Telephone Encounter (Signed)
 Waiting for update from [employer]

## 2024-01-26 ENCOUNTER — Ambulatory Visit: Admitting: Family

## 2024-01-26 ENCOUNTER — Other Ambulatory Visit: Payer: Self-pay | Admitting: Gastroenterology

## 2024-01-28 ENCOUNTER — Other Ambulatory Visit: Payer: Self-pay | Admitting: Cardiology

## 2024-02-01 ENCOUNTER — Other Ambulatory Visit: Payer: Self-pay

## 2024-02-01 ENCOUNTER — Other Ambulatory Visit: Payer: Self-pay | Admitting: Family

## 2024-02-01 DIAGNOSIS — Z1231 Encounter for screening mammogram for malignant neoplasm of breast: Secondary | ICD-10-CM

## 2024-02-01 DIAGNOSIS — J4540 Moderate persistent asthma, uncomplicated: Secondary | ICD-10-CM

## 2024-02-01 MED ORDER — IPRATROPIUM-ALBUTEROL 0.5-2.5 (3) MG/3ML IN SOLN: 3.0000 mL | Freq: Four times a day (QID) | RESPIRATORY_TRACT | 1 refills | Status: AC | PRN

## 2024-02-03 ENCOUNTER — Telehealth: Payer: Self-pay | Admitting: *Deleted

## 2024-02-03 NOTE — Telephone Encounter (Signed)
 Dr.Sater see the below from [facility], your prescribed modafinil  200 mg per note   Modafinil  for sleepiness/fatigue

## 2024-02-08 ENCOUNTER — Encounter: Payer: Self-pay | Admitting: Radiology

## 2024-02-09 ENCOUNTER — Encounter: Payer: Medicare Other | Admitting: Physical Medicine and Rehabilitation

## 2024-02-09 NOTE — Telephone Encounter (Signed)
 Updated setting on [facility]. See below. Sent community message to DME about change made.

## 2024-02-09 NOTE — Telephone Encounter (Signed)
 Dr. Vear- CPAP report below as requested for your review

## 2024-02-11 DIAGNOSIS — M160 Bilateral primary osteoarthritis of hip: Secondary | ICD-10-CM | POA: Insufficient documentation

## 2024-02-11 DIAGNOSIS — M1990 Unspecified osteoarthritis, unspecified site: Secondary | ICD-10-CM | POA: Insufficient documentation

## 2024-02-11 DIAGNOSIS — M357 Hypermobility syndrome: Secondary | ICD-10-CM | POA: Insufficient documentation

## 2024-02-16 ENCOUNTER — Encounter: Payer: Self-pay | Admitting: Cardiology

## 2024-02-16 ENCOUNTER — Ambulatory Visit: Attending: Cardiology | Admitting: Cardiology

## 2024-02-16 VITALS — BP 127/78 | HR 86 | Ht 65.0 in | Wt 216.2 lb

## 2024-02-16 DIAGNOSIS — E669 Obesity, unspecified: Secondary | ICD-10-CM | POA: Diagnosis present

## 2024-02-16 DIAGNOSIS — I10 Essential (primary) hypertension: Secondary | ICD-10-CM | POA: Diagnosis present

## 2024-02-16 DIAGNOSIS — I209 Angina pectoris, unspecified: Secondary | ICD-10-CM

## 2024-02-16 DIAGNOSIS — I2510 Atherosclerotic heart disease of native coronary artery without angina pectoris: Secondary | ICD-10-CM | POA: Diagnosis present

## 2024-02-16 DIAGNOSIS — E782 Mixed hyperlipidemia: Secondary | ICD-10-CM

## 2024-02-16 DIAGNOSIS — R5383 Other fatigue: Secondary | ICD-10-CM | POA: Insufficient documentation

## 2024-02-16 NOTE — Patient Instructions (Addendum)
 Medication Instructions:  Your physician recommends that you continue on your current medications as directed. Please refer to the Current Medication list given to you today.  *If you need a refill on your cardiac medications before your next appointment, please call your pharmacy*   Lab Work: CMP, Lipid, TSH-today If you have labs (blood work) drawn today and your tests are completely normal, you will receive your results only by: MyChart Message (if you have MyChart) OR A paper copy in the mail If you have any lab test that is abnormal or we need to change your treatment, we will call you to review the results.   Testing/Procedures: None Ordered   Follow-Up: At [facility], you and your health needs are our priority.  As part of our continuing mission to provide you with exceptional heart care, we have created designated Provider Care Teams.  These Care Teams include your primary Cardiologist (physician) and Advanced Practice Providers (APPs -  Physician Assistants and Nurse Practitioners) who all work together to provide you with the care you need, when you need it.  We recommend signing up for the patient portal called MyChart.  Sign up information is provided on this After Visit Summary.  MyChart is used to connect with patients for Virtual Visits (Telemedicine).  Patients are able to view lab/test results, encounter notes, upcoming appointments, etc.  Non-urgent messages can be sent to your provider as well.   To learn more about what you can do with MyChart, go to [email or web address].    Your next appointment:   9 month(s)  The format for your next appointment:   In Person  Provider:   Jennifer Crape, MD   Other Instructions NA

## 2024-02-16 NOTE — Progress Notes (Signed)
 Cardiology Office Note:    Date:  02/16/2024   ID:  Renee Brady, DOB [birth year], MRN [id number]  PCP:  Daryl Setter, NP  Cardiologist:  Jennifer JONELLE Crape, MD   Referring MD: Daryl Setter, NP    ASSESSMENT:    1. Coronary artery disease involving native coronary artery of native heart without angina pectoris   2. Essential hypertension   3. Obesity (BMI 35.0-39.9 without comorbidity)   4. Mixed hyperlipidemia   5. Fatigue, unspecified type    PLAN:    In order of problems listed above:  Coronary artery disease: Secondary prevention stressed with the patient.  Importance of compliance with diet medication stressed and patient verbalized standing.  She was advised to ambulate to the best of her ability. Essential hypertension: Blood pressure stable and diet was emphasized.  Lifestyle modification urged. Mixed dyslipidemia: On lipid-lowering medications.  Diet emphasized and we will check blood work today. Obesity: Weight reduction stressed diet emphasized risks of obesity explained and she promises to do better. Patient will be seen in follow-up appointment in 6 months or earlier if the patient has any concerns.    Medication Adjustments/Labs and Tests Ordered: Current medicines are reviewed at length with the patient today.  Concerns regarding medicines are outlined above.  Orders Placed This Encounter  Procedures   Comprehensive metabolic panel with GFR   Lipid panel   TSH   No orders of the defined types were placed in this encounter.    No chief complaint on file.    History of Present Illness:    Renee Brady is a 48 y.o. female.  Patient has past medical history of coronary artery disease, essential hypertension, mixed dyslipidemia.  She ambulates with a cane because of multiple sclerosis issues.  She denies any chest pain orthopnea or PND.  She takes care of her activities of daily living.  At the time of my evaluation, the patient is alert awake  oriented and in no distress.  She is telling me that she is dieting well and has lost weight.  Past Medical History:  Diagnosis Date   ADHD 08/10/2022   Allergic conjunctivitis 04/14/2022   Allergic rhinitis 09/29/2022   ANA positive 06/26/2014   Formatting of this note might be different from the original.  Overview:   Evaluated by Rheumatology Dr. JENEANE - dx fibromyalgia given.     Angina pectoris 05/25/2020   Ankle pain, left 08/27/2014   Formatting of this note might be different from the original.  Overview:   Fell on this day with injury to Lt foot and ankle.     Anxiety and depression 04/03/2015   f/by [facility]   Asthma    Bipolar 2 disorder, major depressive episode (HCC) 04/14/2022   CAD (coronary artery disease) 07/03/2020   f/by Dr. Crape   Cervical radiculitis 10/23/2016   Chronic allergic conjunctivitis    Chronic pain of left knee 09/18/2014   Formatting of this note might be different from the original.  Overview:   Evaluated by Ortho.     Chronic pain syndrome 02/16/2014   focused in her spine, and f/by [facility]   Coronary artery disease with stable angina pectoris 07/03/2020   Degenerative disc disease, cervical 07/06/2023   Degenerative disc disease, lumbar 07/06/2023   Diverticulosis    Per patient.   Dysesthesia 01/26/2019   Encounter for Medicare annual wellness exam 07/01/2022   Epidural fibrosis 06/15/2019   Essential hypertension    Fibromyalgia muscle pain  Gait disturbance 02/11/2022   Gastroduodenitis    Per patient.     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 Charlanne   Iron deficiency anemia    Joint laxity 09/30/2022   Levoscoliosis     Lumbar back pain    Lumbar radiculopathy 01/26/2019   Microscopic hematuria 07/28/2023   Midline low back pain 10/18/2021   Multiple sclerosis 12/22/2014   f/by Dr. Vear   Neck pain 08/19/2022   Numbness and tingling 04/11/2014   Obesity (BMI 35.0-39.9 without comorbidity) 05/25/2020   Osteoarthritis    f/by Dr. 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  PT, Life-style modification, and NSAID PRN.     Prediabetes 10/18/2021   PTSD (post-traumatic stress disorder)    Raynaud's disease 06/05/2014   RBC microcytosis 04/14/2022   Right hip pain 07/28/2023   Sensitivity to medication 04/24/2015    Includes all SSRI's (Prozac, Paxil, Lexapro, Zoloft).    Also Wellbutrin, hydroxyzine , and gabapentin.   Reactions are paradoxical in nature with hyper-stimulatory response,   mood changes, and panic attacks.   Amitriptyline  has been tolerated well (2016).     Spinal stenosis in cervical region 10/23/2016   Subcutaneous nodule of chest wall 04/14/2022   Trochanteric bursitis of right hip 07/13/2014   Formatting of this note might be different from the original.  Evaluated by Ortho.  Tx with CSI and PT.     Vision abnormalities    Vitamin D  deficiency 07/28/2014    Past Surgical History:  Procedure Laterality Date   ABDOMINAL HYSTERECTOMY     uterus and cervix removed but still has ovaries   CESAREAN SECTION  x 2   COLONOSCOPY  12/28/2009   Mild sigmoid diverticulosis. Small internal hemorrhoids.   ESOPHAGOGASTRODUODENOSCOPY  12/28/2009   Mild gastroduodenitis. Otherwise normal EGD   LAPAROSCOPIC REMOVAL ABDOMINAL MASS Right 02/05/2023   Procedure: LAPAROSCOPIC REMOVAL RIGHT RETROPERIONEAL MASS;  Surgeon: Dasie Leonor CROME, MD;  Location: [facility] OR;  Service:  General;  Laterality: Right;   LEFT HEART CATH AND CORONARY ANGIOGRAPHY N/A 06/08/2020   Procedure: LEFT HEART CATH AND CORONARY ANGIOGRAPHY;  Surgeon: Verlin Lonni BIRCH, MD;  Location: [facility] INVASIVE CV LAB;  Service: Cardiovascular;  Laterality: N/A;   lumbar laminectomy Left 11/02/2016   TONSILLECTOMY     WISDOM TOOTH EXTRACTION      Current Medications: Current Meds  Medication Sig   albuterol  (VENTOLIN  HFA) 108 (90 Base) MCG/ACT inhaler Inhale 2 puffs into the lungs every 6 (six) hours as needed for wheezing or shortness of breath.   ALPRAZolam (XANAX) 0.5 MG tablet Take 0.5 mg by mouth daily as needed for anxiety.   amitriptyline  (ELAVIL ) 50 MG tablet TAKE 1 TABLET BY MOUTH EVERYDAY AT BEDTIME   aspirin  EC 81 MG tablet Take 1 tablet (81 mg total) by mouth daily. Swallow whole.   atorvastatin  (LIPITOR) 20 MG tablet Take 1 tablet (20 mg total) by mouth daily.   azelastine  (ASTELIN ) 0.1 %  nasal spray Place 1-2 sprays into both nostrils 2 (two) times daily as needed.   Cholecalciferol (VITAMIN D3) 25 MCG (1000 UT) CAPS Take 1,000 Units by mouth in the morning.   cromolyn  (OPTICROM ) 4 % ophthalmic solution Place 1 drop into both eyes 4 (four) times daily.   hydrOXYzine  (ATARAX ) 25 MG tablet TAKE 1 TABLET BY MOUTH 3 TIMES DAILY AS NEEDED FOR ITCHING.   ipratropium-albuterol  (DUONEB) 0.5-2.5 (3) MG/3ML SOLN Take 3 mLs by nebulization every 6 (six) hours as needed (wheezing).   isosorbide  mononitrate (IMDUR ) 30 MG 24 hr tablet TAKE 1 TABLET BY MOUTH EVERY DAY   loratadine  (CLARITIN ) 10 MG tablet Take 1 tablet (10 mg total) by mouth daily.   methocarbamol  (ROBAXIN ) 500 MG tablet Take 1 tablet (500 mg total) by mouth every 6 (six) hours as needed for muscle spasms.   metoprolol  succinate (TOPROL -XL) 50 MG 24 hr tablet Take 1 tablet (50 mg total) by mouth daily.   montelukast  (SINGULAIR ) 10 MG tablet TAKE 1 TABLET BY MOUTH EVERYDAY AT BEDTIME   NIFEdipine  (PROCARDIA -XL/NIFEDICAL-XL) 30 MG 24  hr tablet TAKE 1 TABLET BY MOUTH EVERY DAY   nitroGLYCERIN  (NITROSTAT ) 0.4 MG SL tablet Place 1 tablet (0.4 mg total) under the tongue every 5 (five) minutes as needed for chest pain.   omega-3 acid ethyl esters (LOVAZA) 1 g capsule Take 1 g by mouth daily.   ondansetron  (ZOFRAN ) 4 MG tablet Take 1 tablet (4 mg total) by mouth daily as needed for nausea or vomiting.   pantoprazole  (PROTONIX ) 40 MG tablet Take 1 tablet (40 mg total) by mouth daily. Patient needs follow up appointment for future refills. Please call [phone] to schedule an appointment.   solifenacin  (VESICARE ) 10 MG tablet One po qd   topiramate  (TOPAMAX ) 50 MG tablet Take 1 tablet (50 mg total) by mouth daily. (Patient taking differently: Take 50 mg by mouth as needed.)   traMADol (ULTRAM) 50 MG tablet Take 50 mg by mouth every 6 (six) hours as needed.   triamcinolone  cream (KENALOG ) 0.1 % Apply 1 Application topically 2 (two) times daily. (Patient taking differently: Apply 1 Application topically as needed.)   zolpidem (AMBIEN) 10 MG tablet Take 10 mg by mouth at bedtime as needed.     Allergies:   Breo ellipta  [fluticasone  furoate-vilanterol], Inderal [propranolol], Lactose, Lactose intolerance (gi), Latex, Mobic [meloxicam], Neurontin [gabapentin], Oxycodone  hcl, Percocet [oxycodone -acetaminophen ], Prozac [fluoxetine hcl], Serotonin, Serotonin reuptake inhibitors (ssris), Tecfidera [dimethyl fumarate], Trospium  chloride, Wixela inhub [fluticasone -salmeterol], Zoloft [sertraline], Buspirone  hcl, Hydrochlorothiazide, and Penicillins   Social History   Socioeconomic History   Marital status: Married    Spouse name: Not on file   Number of children: 2   Years of education: Not on file   Highest education level: Associate degree: academic program  Occupational History   Not on file  Tobacco Use   Smoking status: Former    Current packs/day: 0.00    Types: Cigarettes    Quit date: 2005    Years since quitting: 20.8    Smokeless tobacco: Never  Vaping Use   Vaping status: Never Used  Substance and Sexual Activity   Alcohol use: No   Drug use: Yes    Types: Marijuana    Comment: marijuana use daily   Sexual activity: Yes    Birth control/protection: Surgical  Other Topics Concern   Not on file  Social History Narrative   One son, one daughter   Associates degree in business   One  son in the army and one daughter   2 grand daughters   Married   Enjoys, reading, walking, stationary bike   Social Drivers of Corporate Investment Banker Strain: Low Risk  (07/06/2023)   Overall Financial Resource Strain (CARDIA)    Difficulty of Paying Living Expenses: Not hard at all  Food Insecurity: No Food Insecurity (07/06/2023)   Hunger Vital Sign    Worried About Running Out of Food in the Last Year: Never true    Ran Out of Food in the Last Year: Never true  Transportation Needs: No Transportation Needs (07/06/2023)   PRAPARE - Administrator, Civil Service (Medical): No    Lack of Transportation (Non-Medical): No  Physical Activity: Inactive (07/06/2023)   Exercise Vital Sign    Days of Exercise per Week: 0 days    Minutes of Exercise per Session: 0 min  Stress: Stress Concern Present (07/06/2023)   [employer] of Occupational Health - Occupational Stress Questionnaire    Feeling of Stress : Very much  Social Connections: Moderately Isolated (07/06/2023)   Social Connection and Isolation Panel    Frequency of Communication with Friends and Family: Once a week    Frequency of Social Gatherings with Friends and Family: More than three times a week    Attends Religious Services: Never    Database Administrator or Organizations: No    Attends Banker Meetings: Never    Marital Status: Married     Family History: The patient's family history includes ADD / ADHD in her daughter and father; Allergic rhinitis in her daughter and son; Asthma in her daughter, mother, and son; Bone  cancer in her maternal grandmother; CAD in her paternal grandfather; Cardiomyopathy in her mother; Depression in her father; Diabetes in her brother and mother; Eczema in her granddaughter and son; Esophageal cancer in her maternal uncle; Heart attack in her paternal grandmother; Hypertension in her father and mother; Kidney disease in her father; Prostate cancer in her paternal grandfather; Stroke in her mother. There is no history of Colon cancer, Rectal cancer, Stomach cancer, or Immunodeficiency.  ROS:   Please see the history of present illness.    All other systems reviewed and are negative.  EKGs/Labs/Other Studies Reviewed:    The following studies were reviewed today: .SABRA   I discussed my findings with the patient at length   Recent Labs: 06/30/2023: TSH 1.610 07/24/2023: ALT 14; BUN 12; Creatinine, Ser 1.11; Potassium 4.8; Sodium 137  Recent Lipid Panel    Component Value Date/Time   CHOL 117 07/24/2023 0930   TRIG 70 07/24/2023 0930   HDL 48 07/24/2023 0930   CHOLHDL 2.4 07/24/2023 0930   CHOLHDL 3 01/26/2023 1014   VLDL 10.8 01/26/2023 1014   LDLCALC 54 07/24/2023 0930    Physical Exam:    VS:  BP 127/78   Pulse 86   Ht 5' 5 (1.651 m)   Wt 216 lb 3.2 oz (98.1 kg)   SpO2 99%   BMI 35.98 kg/m     Wt Readings from Last 3 Encounters:  02/16/24 216 lb 3.2 oz (98.1 kg)  11/24/23 225 lb 8 oz (102.3 kg)  09/28/23 226 lb 4.8 oz (102.6 kg)     GEN: Patient is in no acute distress HEENT: Normal NECK: No JVD; No carotid bruits LYMPHATICS: No lymphadenopathy CARDIAC: Hear sounds regular, 2/6 systolic murmur at the apex. RESPIRATORY:  Clear to auscultation without rales, wheezing or rhonchi  ABDOMEN: Soft, non-tender, non-distended MUSCULOSKELETAL:  No edema; No deformity  SKIN: Warm and dry NEUROLOGIC:  Alert and oriented x 3 PSYCHIATRIC:  Normal affect   Signed, Jennifer JONELLE Crape, MD  02/16/2024 10:26 AM    [facility]

## 2024-02-17 ENCOUNTER — Ambulatory Visit: Payer: Self-pay | Admitting: Cardiology

## 2024-02-17 ENCOUNTER — Ambulatory Visit: Admitting: Family

## 2024-02-17 LAB — LIPID PANEL
Chol/HDL Ratio: 2.8 ratio (ref 0.0–4.4)
Cholesterol, Total: 119 mg/dL (ref 100–199)
HDL: 42 mg/dL (ref >39)
LDL Chol Calc (NIH): 62 mg/dL (ref 0–99)
Triglycerides: 70 mg/dL (ref 0–149)
VLDL Cholesterol Cal: 15 mg/dL (ref 5–40)

## 2024-02-17 LAB — COMPREHENSIVE METABOLIC PANEL WITH GFR
ALT: 15 IU/L (ref 0–32)
AST: 12 IU/L (ref 0–40)
Albumin: 4 g/dL (ref 3.9–4.9)
Alkaline Phosphatase: 88 IU/L (ref 41–116)
BUN/Creatinine Ratio: 14 (ref 9–23)
BUN: 14 mg/dL (ref 6–24)
Bilirubin Total: 1.1 mg/dL (ref 0.0–1.2)
CO2: 22 mmol/L (ref 20–29)
Calcium: 9.2 mg/dL (ref 8.7–10.2)
Chloride: 101 mmol/L (ref 96–106)
Creatinine, Ser: 1 mg/dL (ref 0.57–1.00)
Globulin, Total: 2.4 g/dL (ref 1.5–4.5)
Glucose: 128 mg/dL — ABNORMAL HIGH (ref 70–99)
Potassium: 4.3 mmol/L (ref 3.5–5.2)
Sodium: 136 mmol/L (ref 134–144)
Total Protein: 6.4 g/dL (ref 6.0–8.5)
eGFR: 69 mL/min/1.73 (ref >59)

## 2024-02-17 LAB — TSH: TSH: 1.78 u[IU]/mL (ref 0.450–4.500)

## 2024-02-17 MED ORDER — NITROGLYCERIN 0.4 MG SL SUBL: 0.4000 mg | SUBLINGUAL_TABLET | SUBLINGUAL | 11 refills | Status: AC | PRN

## 2024-02-17 MED ORDER — ASPIRIN EC 81 MG PO TBEC: 81.0000 mg | DELAYED_RELEASE_TABLET | Freq: Every day | ORAL | 3 refills | Status: AC

## 2024-02-17 MED ORDER — METOPROLOL SUCCINATE ER 50 MG PO TB24: 50.0000 mg | ORAL_TABLET | Freq: Every day | ORAL | 3 refills | Status: AC

## 2024-02-17 MED ORDER — ATORVASTATIN CALCIUM 20 MG PO TABS: 20.0000 mg | ORAL_TABLET | Freq: Every day | ORAL | 3 refills | Status: AC

## 2024-02-22 ENCOUNTER — Ambulatory Visit: Admitting: Neurology

## 2024-02-23 ENCOUNTER — Ambulatory Visit: Admitting: Family

## 2024-02-23 ENCOUNTER — Encounter: Payer: Self-pay | Admitting: Family

## 2024-02-23 VITALS — BP 134/87 | HR 76 | Temp 98.4°F | Resp 16 | Wt 218.0 lb

## 2024-02-23 DIAGNOSIS — I10 Essential (primary) hypertension: Secondary | ICD-10-CM

## 2024-02-23 DIAGNOSIS — J309 Allergic rhinitis, unspecified: Secondary | ICD-10-CM

## 2024-02-23 DIAGNOSIS — R29898 Other symptoms and signs involving the musculoskeletal system: Secondary | ICD-10-CM

## 2024-02-23 DIAGNOSIS — G35D Multiple sclerosis, unspecified: Secondary | ICD-10-CM

## 2024-02-23 DIAGNOSIS — R7303 Prediabetes: Secondary | ICD-10-CM | POA: Diagnosis not present

## 2024-02-23 DIAGNOSIS — G894 Chronic pain syndrome: Secondary | ICD-10-CM

## 2024-02-23 DIAGNOSIS — E785 Hyperlipidemia, unspecified: Secondary | ICD-10-CM | POA: Diagnosis not present

## 2024-02-23 DIAGNOSIS — G4700 Insomnia, unspecified: Secondary | ICD-10-CM

## 2024-02-23 DIAGNOSIS — I7300 Raynaud's syndrome without gangrene: Secondary | ICD-10-CM

## 2024-02-23 DIAGNOSIS — J45909 Unspecified asthma, uncomplicated: Secondary | ICD-10-CM

## 2024-02-23 DIAGNOSIS — D509 Iron deficiency anemia, unspecified: Secondary | ICD-10-CM

## 2024-02-23 DIAGNOSIS — M8580 Other specified disorders of bone density and structure, unspecified site: Secondary | ICD-10-CM

## 2024-02-23 DIAGNOSIS — L237 Allergic contact dermatitis due to plants, except food: Secondary | ICD-10-CM

## 2024-02-23 DIAGNOSIS — J4540 Moderate persistent asthma, uncomplicated: Secondary | ICD-10-CM

## 2024-02-23 DIAGNOSIS — Z780 Asymptomatic menopausal state: Secondary | ICD-10-CM

## 2024-02-23 DIAGNOSIS — F4310 Post-traumatic stress disorder, unspecified: Secondary | ICD-10-CM

## 2024-02-23 MED ORDER — TRIAMCINOLONE ACETONIDE 0.1 % EX CREA: 1.0000 | TOPICAL_CREAM | CUTANEOUS | 1 refills | Status: DC | PRN

## 2024-02-23 MED ORDER — MONTELUKAST SODIUM 10 MG PO TABS: ORAL_TABLET | ORAL | 1 refills | Status: AC

## 2024-02-23 MED ORDER — ALBUTEROL SULFATE HFA 108 (90 BASE) MCG/ACT IN AERS: 2.0000 | INHALATION_SPRAY | Freq: Four times a day (QID) | RESPIRATORY_TRACT | 1 refills | Status: AC | PRN

## 2024-02-23 MED ORDER — NIFEDIPINE ER OSMOTIC RELEASE 30 MG PO TB24: 30.0000 mg | ORAL_TABLET | Freq: Every day | ORAL | 1 refills | Status: AC

## 2024-02-23 MED ORDER — METHOCARBAMOL 500 MG PO TABS: 500.0000 mg | ORAL_TABLET | Freq: Four times a day (QID) | ORAL | 1 refills | Status: AC | PRN

## 2024-02-23 MED ORDER — AMITRIPTYLINE HCL 50 MG PO TABS
50.0000 mg | ORAL_TABLET | Freq: Every evening | ORAL | 1 refills | Status: DC | PRN
Start: 2024-02-23 — End: 2024-03-01

## 2024-02-23 NOTE — Assessment & Plan Note (Signed)
 Following with Dr. Vear, reports MS is stable.

## 2024-02-23 NOTE — Assessment & Plan Note (Signed)
 Lab Results  Component Value Date   HGBA1C 6.3 07/28/2023   HGBA1C 6.4 01/26/2023   HGBA1C 6.0 09/29/2022   Lab Results  Component Value Date   LDLCALC 62 02/16/2024   CREATININE 1.00 02/16/2024  Update A1C.

## 2024-02-23 NOTE — Assessment & Plan Note (Signed)
 BP Readings from Last 3 Encounters:  02/23/24 134/87  02/16/24 127/78  11/24/23 123/83   Stable on nifedipine  xl, toprol  xl.
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
## 2024-02-23 NOTE — Assessment & Plan Note (Signed)
 Update dexa.

## 2024-02-23 NOTE — Assessment & Plan Note (Signed)
 She is followed by [facility], Tinnie Garret.

## 2024-02-23 NOTE — Assessment & Plan Note (Signed)
 Lab Results  Component Value Date   CHOL 119 02/16/2024   HDL 42 02/16/2024   LDLCALC 62 02/16/2024   TRIG 70 02/16/2024   CHOLHDL 2.8 02/16/2024   At goal on lipitor.

## 2024-02-23 NOTE — Assessment & Plan Note (Signed)
 Used albuterol  yesterday.

## 2024-02-23 NOTE — Assessment & Plan Note (Signed)
 Stable with singulair  and astelin .

## 2024-02-23 NOTE — Assessment & Plan Note (Signed)
 Lab Results  Component Value Date   WBC 5.0 01/13/2023   HGB 12.1 01/13/2023   HCT 39.4 01/13/2023   MCV 78.5 01/13/2023   PLT 299.0 01/13/2023   Update cbc, iron studies.

## 2024-02-25 ENCOUNTER — Other Ambulatory Visit: Payer: Self-pay | Admitting: Family

## 2024-02-25 ENCOUNTER — Encounter: Payer: Self-pay | Admitting: Family

## 2024-02-25 ENCOUNTER — Other Ambulatory Visit (INDEPENDENT_AMBULATORY_CARE_PROVIDER_SITE_OTHER)

## 2024-02-25 DIAGNOSIS — L237 Allergic contact dermatitis due to plants, except food: Secondary | ICD-10-CM

## 2024-02-25 DIAGNOSIS — D509 Iron deficiency anemia, unspecified: Secondary | ICD-10-CM

## 2024-02-25 DIAGNOSIS — R7303 Prediabetes: Secondary | ICD-10-CM | POA: Diagnosis not present

## 2024-02-25 LAB — CBC WITH DIFFERENTIAL/PLATELET
Basophils Absolute: 0 K/uL (ref 0.0–0.1)
Basophils Relative: 0.8 % (ref 0.0–3.0)
Eosinophils Absolute: 0 K/uL (ref 0.0–0.7)
Eosinophils Relative: 1 % (ref 0.0–5.0)
HCT: 38.4 % (ref 36.0–46.0)
Hemoglobin: 12 g/dL (ref 12.0–15.0)
Lymphocytes Relative: 33.2 % (ref 12.0–46.0)
Lymphs Abs: 1.6 K/uL (ref 0.7–4.0)
MCHC: 31.3 g/dL (ref 30.0–36.0)
MCV: 78.8 fl (ref 78.0–100.0)
Monocytes Absolute: 0.4 K/uL (ref 0.1–1.0)
Monocytes Relative: 8.1 % (ref 3.0–12.0)
Neutro Abs: 2.7 K/uL (ref 1.4–7.7)
Neutrophils Relative %: 56.9 % (ref 43.0–77.0)
Platelets: 265 K/uL (ref 150.0–400.0)
RBC: 4.87 Mil/uL (ref 3.87–5.11)
RDW: 14.3 % (ref 11.5–15.5)
WBC: 4.8 K/uL (ref 4.0–10.5)

## 2024-02-25 LAB — IBC + FERRITIN
Ferritin: 84.7 ng/mL (ref 10.0–291.0)
Iron: 88 ug/dL (ref 42–145)
Saturation Ratios: 28.6 % (ref 20.0–50.0)
TIBC: 308 ug/dL (ref 250.0–450.0)
Transferrin: 220 mg/dL (ref 212.0–360.0)

## 2024-02-25 LAB — HEMOGLOBIN A1C: Hgb A1c MFr Bld: 6.1 % (ref 4.6–6.5)

## 2024-02-25 MED ORDER — TRIAMCINOLONE ACETONIDE 0.1 % EX CREA
1.0000 | TOPICAL_CREAM | Freq: Two times a day (BID) | CUTANEOUS | 1 refills | Status: AC
Start: 2024-02-25 — End: 2024-05-25

## 2024-02-25 NOTE — Patient Instructions (Signed)
 VISIT SUMMARY: During your follow-up visit, we discussed your multiple chronic conditions and reviewed your current management plans. We addressed your recent discomfort from a bladder device, your ongoing pain issues, and your mental health conditions. We also reviewed your cardiovascular health, respiratory issues, gastrointestinal concerns, and other chronic conditions. Your medications and lifestyle modifications were evaluated, and necessary referrals and tests were ordered.  YOUR PLAN: -CHRONIC PAIN SYNDROME AND FIBROMYALGIA: Chronic pain syndrome and fibromyalgia cause widespread pain that is often worsened by activity. You will continue taking methocarbamol  500 mg every 6 hours as needed and amitriptyline  50 mg at bedtime. You have been referred to pain management for further evaluation and treatment options.  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 You have been referred to pain management for further evaluation and treatment options.  -OSTEOARTHRITIS OF HIPS AND KNEES: Osteoarthritis is a joint condition causing pain and instability. You will continue with your current management plan and monitor your symptoms.  -MULTIPLE SCLEROSIS: Multiple sclerosis is a condition affecting the nerves, causing pain and weakness. You will continue with your current management plan and monitor your symptoms.  -MODERATE PERSISTENT ASTHMA AND CHRONIC ALLERGIC RHINITIS: Asthma and chronic allergic rhinitis cause breathing difficulties and allergy  symptoms. You will continue taking montelukast  10 mg at bedtime and azelastine  nasal spray twice daily as needed.  -BIPOLAR DISORDER, PTSD WITH AGORAPHOBIA, OCD, AND ADHD: These mental health conditions affect your mood, behavior, and ability to function in certain situations. You will continue with your current behavioral health management  plan.  -HYPERTENSION: Hypertension is high blood pressure. Your blood pressure is well-controlled with metoprolol  succinate 50 mg daily and nifedipine  30 mg daily.  -HYPERLIPIDEMIA: Hyperlipidemia is high cholesterol. Your cholesterol levels are well-managed with atorvastatin  20 mg daily.  -IRON DEFICIENCY ANEMIA: Iron deficiency anemia is a condition where you have low iron levels. Lab tests have been ordered to check your iron levels.  -OSTEOPENIA (BONE DENSITY LOSS): Osteopenia is a condition where your bones are weaker than normal. A bone density scan has been ordered to assess your bone health.  -MICROSCOPIC HEMATURIA: Microscopic hematuria is the presence of small amounts of blood in your urine. We will continue to monitor this condition.  -INSOMNIA: Insomnia is difficulty sleeping. You will continue taking zolpidem 10 mg at bedtime, which has improved your sleep quality.  -DIVERTICULOSIS OF COLON: Diverticulosis is a condition where small pouches form in the colon. You will continue follow-up with your gastroenterologist for o ngoing gastrointestinal symptoms.  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 You will continue monitoring and follow-up with relevant specialists.  -GENERAL HEALTH MAINTENANCE: Your vaccinations and screenings are up to date. Continue with routine health maintenance and screenings.  INSTRUCTIONS: Please follow up with pain management for further evaluation of your chronic pain and cervical spondylosis. Lab tests have been ordered to check your iron levels, and a bone density scan has been scheduled. Continue follow-up with your gastroenterologist for diverticulosis and with relevant specialists for Roser-Dorfman disease. Maintain your current medication regimen and lifestyle modifications, and continue  with routine health maintenance and screenings.                      Contains text generated by [employer].  Contains text generated by [employer].

## 2024-02-26 ENCOUNTER — Ambulatory Visit: Payer: Self-pay | Admitting: Family

## 2024-03-01 MED ORDER — AMITRIPTYLINE HCL 50 MG PO TABS: 50.0000 mg | ORAL_TABLET | Freq: Every evening | ORAL | Status: AC | PRN

## 2024-03-01 NOTE — Addendum Note (Signed)
 Addended by: DARYL SETTER on: 03/01/2024 01:06 PM   Modules accepted: Orders

## 2024-03-04 ENCOUNTER — Other Ambulatory Visit: Payer: Self-pay | Admitting: Internal Medicine

## 2024-03-08 ENCOUNTER — Encounter: Payer: Self-pay | Admitting: Family

## 2024-03-16 ENCOUNTER — Ambulatory Visit

## 2024-03-17 ENCOUNTER — Other Ambulatory Visit: Payer: Self-pay | Admitting: Family

## 2024-03-23 NOTE — Progress Notes (Unsigned)
 SABRA

## 2024-03-24 ENCOUNTER — Encounter: Payer: Self-pay | Admitting: Neurology

## 2024-03-24 ENCOUNTER — Ambulatory Visit: Admitting: Neurology

## 2024-03-24 VITALS — BP 114/84 | HR 84 | Ht 65.0 in | Wt 225.0 lb

## 2024-03-24 DIAGNOSIS — G35A Relapsing-remitting multiple sclerosis: Secondary | ICD-10-CM

## 2024-03-24 DIAGNOSIS — M5416 Radiculopathy, lumbar region: Secondary | ICD-10-CM | POA: Diagnosis not present

## 2024-03-24 DIAGNOSIS — G96198 Other disorders of meninges, not elsewhere classified: Secondary | ICD-10-CM

## 2024-03-24 DIAGNOSIS — R269 Unspecified abnormalities of gait and mobility: Secondary | ICD-10-CM | POA: Diagnosis not present

## 2024-03-24 DIAGNOSIS — G4733 Obstructive sleep apnea (adult) (pediatric): Secondary | ICD-10-CM

## 2024-03-24 DIAGNOSIS — R3915 Urgency of urination: Secondary | ICD-10-CM | POA: Diagnosis not present
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
## 2024-03-29 ENCOUNTER — Encounter: Payer: Self-pay | Admitting: *Deleted

## 2024-04-12 ENCOUNTER — Encounter: Payer: Self-pay | Admitting: Neurology

## 2024-04-12 ENCOUNTER — Ambulatory Visit
Admission: RE | Admit: 2024-04-12 | Discharge: 2024-04-12 | Disposition: A | Source: Ambulatory Visit | Attending: Family | Admitting: Family

## 2024-04-12 DIAGNOSIS — Z1231 Encounter for screening mammogram for malignant neoplasm of breast: Secondary | ICD-10-CM

## 2024-04-14 ENCOUNTER — Other Ambulatory Visit: Payer: Self-pay | Admitting: Neurology

## 2024-04-14 ENCOUNTER — Ambulatory Visit: Payer: Self-pay | Admitting: Family

## 2024-04-14 DIAGNOSIS — G96198 Other disorders of meninges, not elsewhere classified: Secondary | ICD-10-CM

## 2024-04-14 DIAGNOSIS — M5416 Radiculopathy, lumbar region: Secondary | ICD-10-CM

## 2024-04-14 DIAGNOSIS — Z9889 Other specified postprocedural states: Secondary | ICD-10-CM

## 2024-04-18 ENCOUNTER — Telehealth: Payer: Self-pay | Admitting: Neurology

## 2024-04-18 NOTE — Telephone Encounter (Signed)
 She was scheduled for the MRI on 1/20 at the [facility] in [facility] and she cancelled it today stating she wants a second opinion before getting a MRI.  Also, on ESI order: 04/12/24 pt wants to have updated mri done before having the injection.

## 2024-04-18 NOTE — Telephone Encounter (Signed)
 Hulan barrows: [id number]  exp. 10/13/24 for [facility]

## 2024-04-18 NOTE — Telephone Encounter (Signed)
 Those are what the notes say on the orders from GI when she cancelled her appointments-I don't see where she's reached out to our office and asked us  for a 2nd opinion referral.

## 2024-04-22 ENCOUNTER — Other Ambulatory Visit: Payer: Self-pay | Admitting: Family

## 2024-04-22 ENCOUNTER — Encounter: Payer: Self-pay | Admitting: Family

## 2024-04-22 DIAGNOSIS — R29898 Other symptoms and signs involving the musculoskeletal system: Secondary | ICD-10-CM

## 2024-04-24 NOTE — Telephone Encounter (Signed)
 Renee Brady, Can you please resend the referral to [facility]?  Pt states she has not been contacted. Thanks

## 2024-04-25 ENCOUNTER — Ambulatory Visit (HOSPITAL_BASED_OUTPATIENT_CLINIC_OR_DEPARTMENT_OTHER)
Admission: RE | Admit: 2024-04-25 | Discharge: 2024-04-25 | Disposition: A | Discharge status: Home / Self Care | Source: Ambulatory Visit | Attending: Family | Admitting: Family

## 2024-04-25 DIAGNOSIS — M8580 Other specified disorders of bone density and structure, unspecified site: Secondary | ICD-10-CM | POA: Diagnosis present

## 2024-04-25 DIAGNOSIS — Z780 Asymptomatic menopausal state: Secondary | ICD-10-CM | POA: Diagnosis present

## 2024-04-26 ENCOUNTER — Ambulatory Visit (HOSPITAL_BASED_OUTPATIENT_CLINIC_OR_DEPARTMENT_OTHER)

## 2024-04-29 NOTE — Addendum Note (Signed)
 Addended by: DARYL SETTER on: 04/29/2024 12:35 PM   Modules accepted: Orders

## 2024-05-03 ENCOUNTER — Ambulatory Visit (HOSPITAL_BASED_OUTPATIENT_CLINIC_OR_DEPARTMENT_OTHER)

## 2024-05-05 NOTE — Telephone Encounter (Signed)
 Patient was advised referral was sent to the location she requested. The do surgeries but this is not what she is being referred for. She verbalized understanding.

## 2024-05-31 ENCOUNTER — Ambulatory Visit: Admitting: Family

## 2024-07-06 ENCOUNTER — Ambulatory Visit

## 2024-11-22 ENCOUNTER — Ambulatory Visit: Admitting: Neurology
